# Patient Record
Sex: Female | Born: 1980
Health system: Southern US, Community
[De-identification: ages and names within clinical notes are randomized; demographics above are authoritative.]

## PROBLEM LIST (undated history)

## (undated) DIAGNOSIS — R87619 Unspecified abnormal cytological findings in specimens from cervix uteri: Secondary | ICD-10-CM

## (undated) DIAGNOSIS — IMO0002 Reserved for concepts with insufficient information to code with codable children: Secondary | ICD-10-CM

## (undated) DIAGNOSIS — G43909 Migraine, unspecified, not intractable, without status migrainosus: Secondary | ICD-10-CM

## (undated) DIAGNOSIS — K219 Gastro-esophageal reflux disease without esophagitis: Secondary | ICD-10-CM

## (undated) DIAGNOSIS — N760 Acute vaginitis: Secondary | ICD-10-CM

## (undated) DIAGNOSIS — B9689 Other specified bacterial agents as the cause of diseases classified elsewhere: Secondary | ICD-10-CM

## (undated) HISTORY — DX: Gastro-esophageal reflux disease without esophagitis: K21.9

## (undated) HISTORY — PX: APPENDECTOMY: SHX54

## (undated) HISTORY — PX: DENTAL SURGERY: SHX609

---
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
## 2001-04-20 ENCOUNTER — Emergency Department (HOSPITAL_COMMUNITY): Admission: EM | Admit: 2001-04-20 | Discharge: 2001-04-20 | Payer: Self-pay | Admitting: Emergency Medicine

## 2001-06-09 ENCOUNTER — Emergency Department (HOSPITAL_COMMUNITY): Admission: EM | Admit: 2001-06-09 | Discharge: 2001-06-09 | Payer: Self-pay | Admitting: *Deleted

## 2001-09-25 ENCOUNTER — Emergency Department (HOSPITAL_COMMUNITY): Admission: EM | Admit: 2001-09-25 | Discharge: 2001-09-25 | Payer: Self-pay | Admitting: Emergency Medicine

## 2001-09-26 ENCOUNTER — Emergency Department (HOSPITAL_COMMUNITY): Admission: EM | Admit: 2001-09-26 | Discharge: 2001-09-26 | Payer: Self-pay | Admitting: Emergency Medicine

## 2001-09-26 ENCOUNTER — Encounter: Payer: Self-pay | Admitting: *Deleted

## 2001-10-17 ENCOUNTER — Emergency Department (HOSPITAL_COMMUNITY): Admission: EM | Admit: 2001-10-17 | Discharge: 2001-10-17 | Payer: Self-pay

## 2001-11-20 ENCOUNTER — Emergency Department (HOSPITAL_COMMUNITY): Admission: EM | Admit: 2001-11-20 | Discharge: 2001-11-21 | Payer: Self-pay | Admitting: Emergency Medicine

## 2001-11-20 ENCOUNTER — Encounter: Payer: Self-pay | Admitting: Emergency Medicine

## 2001-12-10 ENCOUNTER — Encounter: Admission: RE | Admit: 2001-12-10 | Discharge: 2002-03-10 | Payer: Self-pay | Admitting: Occupational Medicine

## 2002-02-14 ENCOUNTER — Emergency Department (HOSPITAL_COMMUNITY): Admission: EM | Admit: 2002-02-14 | Discharge: 2002-02-14 | Payer: Self-pay

## 2002-03-12 ENCOUNTER — Emergency Department (HOSPITAL_COMMUNITY): Admission: EM | Admit: 2002-03-12 | Discharge: 2002-03-13 | Payer: Self-pay | Admitting: Emergency Medicine

## 2002-03-13 ENCOUNTER — Inpatient Hospital Stay (HOSPITAL_COMMUNITY): Admission: AD | Admit: 2002-03-13 | Discharge: 2002-03-13 | Payer: Self-pay | Admitting: *Deleted

## 2002-03-23 ENCOUNTER — Inpatient Hospital Stay (HOSPITAL_COMMUNITY): Admission: AD | Admit: 2002-03-23 | Discharge: 2002-03-23 | Payer: Self-pay | Admitting: *Deleted

## 2002-03-24 ENCOUNTER — Encounter: Payer: Self-pay | Admitting: Obstetrics and Gynecology

## 2002-03-24 ENCOUNTER — Inpatient Hospital Stay (HOSPITAL_COMMUNITY): Admission: AD | Admit: 2002-03-24 | Discharge: 2002-03-24 | Payer: Self-pay | Admitting: Obstetrics and Gynecology

## 2002-04-16 ENCOUNTER — Inpatient Hospital Stay: Admission: AD | Admit: 2002-04-16 | Discharge: 2002-04-16 | Payer: Self-pay | Admitting: Obstetrics and Gynecology

## 2002-05-06 ENCOUNTER — Inpatient Hospital Stay (HOSPITAL_COMMUNITY): Admission: AD | Admit: 2002-05-06 | Discharge: 2002-05-06 | Payer: Self-pay | Admitting: *Deleted

## 2002-05-14 ENCOUNTER — Inpatient Hospital Stay (HOSPITAL_COMMUNITY): Admission: AD | Admit: 2002-05-14 | Discharge: 2002-05-15 | Payer: Self-pay | Admitting: Obstetrics and Gynecology

## 2002-05-21 ENCOUNTER — Inpatient Hospital Stay (HOSPITAL_COMMUNITY): Admission: AD | Admit: 2002-05-21 | Discharge: 2002-05-23 | Payer: Self-pay | Admitting: Obstetrics and Gynecology

## 2002-06-11 ENCOUNTER — Ambulatory Visit (HOSPITAL_COMMUNITY): Admission: RE | Admit: 2002-06-11 | Discharge: 2002-06-11 | Payer: Self-pay | Admitting: *Deleted

## 2002-06-25 ENCOUNTER — Inpatient Hospital Stay (HOSPITAL_COMMUNITY): Admission: AD | Admit: 2002-06-25 | Discharge: 2002-06-25 | Payer: Self-pay | Admitting: Obstetrics and Gynecology

## 2002-07-11 ENCOUNTER — Inpatient Hospital Stay (HOSPITAL_COMMUNITY): Admission: AD | Admit: 2002-07-11 | Discharge: 2002-07-11 | Payer: Self-pay | Admitting: Family Medicine

## 2002-08-13 ENCOUNTER — Emergency Department (HOSPITAL_COMMUNITY): Admission: EM | Admit: 2002-08-13 | Discharge: 2002-08-13 | Payer: Self-pay | Admitting: Emergency Medicine

## 2002-08-24 ENCOUNTER — Inpatient Hospital Stay (HOSPITAL_COMMUNITY): Admission: AD | Admit: 2002-08-24 | Discharge: 2002-08-24 | Payer: Self-pay | Admitting: Obstetrics and Gynecology

## 2002-09-19 ENCOUNTER — Emergency Department (HOSPITAL_COMMUNITY): Admission: EM | Admit: 2002-09-19 | Discharge: 2002-09-19 | Payer: Self-pay | Admitting: Emergency Medicine

## 2002-09-29 ENCOUNTER — Inpatient Hospital Stay (HOSPITAL_COMMUNITY): Admission: AD | Admit: 2002-09-29 | Discharge: 2002-09-29 | Payer: Self-pay | Admitting: *Deleted

## 2002-10-02 ENCOUNTER — Inpatient Hospital Stay (HOSPITAL_COMMUNITY): Admission: AD | Admit: 2002-10-02 | Discharge: 2002-10-02 | Payer: Self-pay | Admitting: *Deleted

## 2002-10-24 ENCOUNTER — Inpatient Hospital Stay (HOSPITAL_COMMUNITY): Admission: AD | Admit: 2002-10-24 | Discharge: 2002-10-24 | Payer: Self-pay | Admitting: Family Medicine

## 2002-10-28 ENCOUNTER — Inpatient Hospital Stay (HOSPITAL_COMMUNITY): Admission: AD | Admit: 2002-10-28 | Discharge: 2002-10-28 | Payer: Self-pay | Admitting: *Deleted

## 2002-10-28 ENCOUNTER — Encounter: Payer: Self-pay | Admitting: *Deleted

## 2002-11-06 ENCOUNTER — Inpatient Hospital Stay: Admission: AD | Admit: 2002-11-06 | Discharge: 2002-11-06 | Payer: Self-pay | Admitting: *Deleted

## 2002-11-10 ENCOUNTER — Inpatient Hospital Stay (HOSPITAL_COMMUNITY): Admission: AD | Admit: 2002-11-10 | Discharge: 2002-11-10 | Payer: Self-pay | Admitting: Obstetrics and Gynecology

## 2002-11-16 ENCOUNTER — Inpatient Hospital Stay (HOSPITAL_COMMUNITY): Admission: AD | Admit: 2002-11-16 | Discharge: 2002-11-18 | Payer: Self-pay | Admitting: *Deleted

## 2003-02-17 ENCOUNTER — Encounter: Payer: Self-pay | Admitting: Emergency Medicine

## 2003-02-17 ENCOUNTER — Emergency Department (HOSPITAL_COMMUNITY): Admission: EM | Admit: 2003-02-17 | Discharge: 2003-02-17 | Payer: Self-pay | Admitting: Emergency Medicine

## 2003-04-12 ENCOUNTER — Emergency Department (HOSPITAL_COMMUNITY): Admission: EM | Admit: 2003-04-12 | Discharge: 2003-04-12 | Payer: Self-pay | Admitting: Emergency Medicine

## 2003-04-26 ENCOUNTER — Inpatient Hospital Stay (HOSPITAL_COMMUNITY): Admission: AD | Admit: 2003-04-26 | Discharge: 2003-04-26 | Payer: Self-pay | Admitting: Family Medicine

## 2003-06-21 ENCOUNTER — Inpatient Hospital Stay (HOSPITAL_COMMUNITY): Admission: AD | Admit: 2003-06-21 | Discharge: 2003-06-21 | Payer: Self-pay | Admitting: Obstetrics & Gynecology

## 2003-09-30 ENCOUNTER — Inpatient Hospital Stay (HOSPITAL_COMMUNITY): Admission: AD | Admit: 2003-09-30 | Discharge: 2003-09-30 | Payer: Self-pay | Admitting: Obstetrics and Gynecology

## 2003-12-10 ENCOUNTER — Inpatient Hospital Stay (HOSPITAL_COMMUNITY): Admission: AD | Admit: 2003-12-10 | Discharge: 2003-12-11 | Payer: Self-pay | Admitting: Family Medicine

## 2004-02-12 ENCOUNTER — Emergency Department (HOSPITAL_COMMUNITY): Admission: EM | Admit: 2004-02-12 | Discharge: 2004-02-12 | Payer: Self-pay | Admitting: Family Medicine

## 2004-04-20 ENCOUNTER — Emergency Department (HOSPITAL_COMMUNITY): Admission: EM | Admit: 2004-04-20 | Discharge: 2004-04-20 | Payer: Self-pay | Admitting: Family Medicine

## 2004-05-04 ENCOUNTER — Inpatient Hospital Stay (HOSPITAL_COMMUNITY): Admission: AD | Admit: 2004-05-04 | Discharge: 2004-05-04 | Payer: Self-pay | Admitting: Obstetrics

## 2004-05-11 ENCOUNTER — Emergency Department (HOSPITAL_COMMUNITY): Admission: EM | Admit: 2004-05-11 | Discharge: 2004-05-11 | Payer: Self-pay | Admitting: Family Medicine

## 2004-05-21 ENCOUNTER — Emergency Department (HOSPITAL_COMMUNITY): Admission: EM | Admit: 2004-05-21 | Discharge: 2004-05-21 | Payer: Self-pay | Admitting: Emergency Medicine

## 2004-06-01 ENCOUNTER — Emergency Department (HOSPITAL_COMMUNITY): Admission: EM | Admit: 2004-06-01 | Discharge: 2004-06-01 | Payer: Self-pay | Admitting: Emergency Medicine

## 2004-06-09 ENCOUNTER — Inpatient Hospital Stay (HOSPITAL_COMMUNITY): Admission: AD | Admit: 2004-06-09 | Discharge: 2004-06-09 | Payer: Self-pay | Admitting: Obstetrics

## 2004-06-19 ENCOUNTER — Inpatient Hospital Stay (HOSPITAL_COMMUNITY): Admission: AD | Admit: 2004-06-19 | Discharge: 2004-06-19 | Payer: Self-pay | Admitting: Obstetrics

## 2004-06-20 ENCOUNTER — Inpatient Hospital Stay (HOSPITAL_COMMUNITY): Admission: AD | Admit: 2004-06-20 | Discharge: 2004-06-20 | Payer: Self-pay | Admitting: Obstetrics

## 2004-07-04 ENCOUNTER — Inpatient Hospital Stay (HOSPITAL_COMMUNITY): Admission: AD | Admit: 2004-07-04 | Discharge: 2004-07-04 | Payer: Self-pay | Admitting: Obstetrics

## 2004-07-05 ENCOUNTER — Inpatient Hospital Stay (HOSPITAL_COMMUNITY): Admission: AD | Admit: 2004-07-05 | Discharge: 2004-07-05 | Payer: Self-pay | Admitting: Obstetrics

## 2004-07-26 ENCOUNTER — Inpatient Hospital Stay (HOSPITAL_COMMUNITY): Admission: AD | Admit: 2004-07-26 | Discharge: 2004-07-26 | Payer: Self-pay | Admitting: Obstetrics

## 2004-07-30 ENCOUNTER — Inpatient Hospital Stay (HOSPITAL_COMMUNITY): Admission: AD | Admit: 2004-07-30 | Discharge: 2004-07-30 | Payer: Self-pay | Admitting: Obstetrics

## 2004-09-01 ENCOUNTER — Inpatient Hospital Stay (HOSPITAL_COMMUNITY): Admission: AD | Admit: 2004-09-01 | Discharge: 2004-09-01 | Payer: Self-pay | Admitting: *Deleted

## 2004-12-02 ENCOUNTER — Inpatient Hospital Stay (HOSPITAL_COMMUNITY): Admission: AD | Admit: 2004-12-02 | Discharge: 2004-12-02 | Payer: Self-pay | Admitting: Obstetrics

## 2004-12-31 ENCOUNTER — Inpatient Hospital Stay (HOSPITAL_COMMUNITY): Admission: AD | Admit: 2004-12-31 | Discharge: 2005-01-02 | Payer: Self-pay | Admitting: Obstetrics

## 2005-02-01 ENCOUNTER — Observation Stay (HOSPITAL_COMMUNITY): Admission: AD | Admit: 2005-02-01 | Discharge: 2005-02-01 | Payer: Self-pay | Admitting: Obstetrics

## 2005-02-03 ENCOUNTER — Encounter (INDEPENDENT_AMBULATORY_CARE_PROVIDER_SITE_OTHER): Payer: Self-pay | Admitting: Specialist

## 2005-02-03 ENCOUNTER — Inpatient Hospital Stay (HOSPITAL_COMMUNITY): Admission: AD | Admit: 2005-02-03 | Discharge: 2005-02-06 | Payer: Self-pay | Admitting: Obstetrics

## 2005-03-14 ENCOUNTER — Emergency Department (HOSPITAL_COMMUNITY): Admission: EM | Admit: 2005-03-14 | Discharge: 2005-03-14 | Payer: Self-pay | Admitting: Family Medicine

## 2005-05-15 ENCOUNTER — Emergency Department (HOSPITAL_COMMUNITY): Admission: EM | Admit: 2005-05-15 | Discharge: 2005-05-15 | Payer: Self-pay | Admitting: Psychiatry

## 2005-09-18 ENCOUNTER — Emergency Department (HOSPITAL_COMMUNITY): Admission: EM | Admit: 2005-09-18 | Discharge: 2005-09-18 | Payer: Self-pay | Admitting: Family Medicine

## 2005-11-29 ENCOUNTER — Ambulatory Visit (HOSPITAL_COMMUNITY): Admission: RE | Admit: 2005-11-29 | Discharge: 2005-11-29 | Payer: Self-pay | Admitting: Obstetrics

## 2006-03-11 ENCOUNTER — Emergency Department (HOSPITAL_COMMUNITY): Admission: EM | Admit: 2006-03-11 | Discharge: 2006-03-11 | Payer: Self-pay | Admitting: Family Medicine

## 2006-07-08 ENCOUNTER — Inpatient Hospital Stay (HOSPITAL_COMMUNITY): Admission: AD | Admit: 2006-07-08 | Discharge: 2006-07-08 | Payer: Self-pay | Admitting: Obstetrics

## 2006-07-21 ENCOUNTER — Emergency Department (HOSPITAL_COMMUNITY): Admission: EM | Admit: 2006-07-21 | Discharge: 2006-07-21 | Payer: Self-pay | Admitting: Family Medicine

## 2006-10-03 ENCOUNTER — Emergency Department (HOSPITAL_COMMUNITY): Admission: EM | Admit: 2006-10-03 | Discharge: 2006-10-03 | Payer: Self-pay | Admitting: Emergency Medicine

## 2006-10-21 ENCOUNTER — Inpatient Hospital Stay (HOSPITAL_COMMUNITY): Admission: AD | Admit: 2006-10-21 | Discharge: 2006-10-21 | Payer: Self-pay | Admitting: Obstetrics

## 2006-12-19 ENCOUNTER — Emergency Department (HOSPITAL_COMMUNITY): Admission: EM | Admit: 2006-12-19 | Discharge: 2006-12-19 | Payer: Self-pay | Admitting: Family Medicine

## 2007-01-20 ENCOUNTER — Emergency Department (HOSPITAL_COMMUNITY): Admission: EM | Admit: 2007-01-20 | Discharge: 2007-01-20 | Payer: Self-pay | Admitting: Emergency Medicine

## 2007-01-21 ENCOUNTER — Emergency Department (HOSPITAL_COMMUNITY): Admission: EM | Admit: 2007-01-21 | Discharge: 2007-01-21 | Payer: Self-pay | Admitting: Emergency Medicine

## 2007-02-20 ENCOUNTER — Emergency Department (HOSPITAL_COMMUNITY): Admission: EM | Admit: 2007-02-20 | Discharge: 2007-02-21 | Payer: Self-pay | Admitting: Emergency Medicine

## 2007-04-25 ENCOUNTER — Emergency Department (HOSPITAL_COMMUNITY): Admission: EM | Admit: 2007-04-25 | Discharge: 2007-04-25 | Payer: Self-pay | Admitting: Emergency Medicine

## 2007-08-20 ENCOUNTER — Inpatient Hospital Stay (HOSPITAL_COMMUNITY): Admission: AD | Admit: 2007-08-20 | Discharge: 2007-08-20 | Payer: Self-pay | Admitting: Obstetrics & Gynecology

## 2007-09-29 ENCOUNTER — Emergency Department (HOSPITAL_COMMUNITY): Admission: EM | Admit: 2007-09-29 | Discharge: 2007-09-29 | Payer: Self-pay | Admitting: Emergency Medicine

## 2007-10-16 ENCOUNTER — Emergency Department (HOSPITAL_COMMUNITY): Admission: EM | Admit: 2007-10-16 | Discharge: 2007-10-16 | Payer: Self-pay | Admitting: Emergency Medicine

## 2007-10-19 ENCOUNTER — Emergency Department (HOSPITAL_COMMUNITY): Admission: EM | Admit: 2007-10-19 | Discharge: 2007-10-19 | Payer: Self-pay | Admitting: Emergency Medicine

## 2007-10-26 ENCOUNTER — Inpatient Hospital Stay (HOSPITAL_COMMUNITY): Admission: AD | Admit: 2007-10-26 | Discharge: 2007-10-26 | Payer: Self-pay | Admitting: Obstetrics & Gynecology

## 2007-10-26 ENCOUNTER — Emergency Department (HOSPITAL_COMMUNITY): Admission: EM | Admit: 2007-10-26 | Discharge: 2007-10-26 | Payer: Self-pay | Admitting: Family Medicine

## 2007-10-29 ENCOUNTER — Inpatient Hospital Stay (HOSPITAL_COMMUNITY): Admission: AD | Admit: 2007-10-29 | Discharge: 2007-10-29 | Payer: Self-pay | Admitting: Gynecology

## 2007-11-06 ENCOUNTER — Inpatient Hospital Stay (HOSPITAL_COMMUNITY): Admission: AD | Admit: 2007-11-06 | Discharge: 2007-11-06 | Payer: Self-pay | Admitting: Obstetrics

## 2007-11-14 ENCOUNTER — Emergency Department (HOSPITAL_COMMUNITY): Admission: EM | Admit: 2007-11-14 | Discharge: 2007-11-14 | Payer: Self-pay | Admitting: Family Medicine

## 2007-12-03 ENCOUNTER — Encounter: Admission: RE | Admit: 2007-12-03 | Discharge: 2008-01-16 | Payer: Self-pay | Admitting: Orthopedic Surgery

## 2007-12-11 ENCOUNTER — Inpatient Hospital Stay (HOSPITAL_COMMUNITY): Admission: AD | Admit: 2007-12-11 | Discharge: 2007-12-11 | Payer: Self-pay | Admitting: Obstetrics

## 2007-12-12 ENCOUNTER — Ambulatory Visit (HOSPITAL_COMMUNITY): Admission: RE | Admit: 2007-12-12 | Discharge: 2007-12-12 | Payer: Self-pay | Admitting: Obstetrics

## 2007-12-12 ENCOUNTER — Encounter (INDEPENDENT_AMBULATORY_CARE_PROVIDER_SITE_OTHER): Payer: Self-pay | Admitting: Obstetrics

## 2008-02-02 IMAGING — CR DG WRIST COMPLETE 3+V*R*
3 series · 3 of 3 positions shown · non-contrast
Comparison: Report from right wrist 0779.  The images have been purged from the system and are not available for review.

CLINICAL DATA: Trauma and fall.  
 RIGHT WRIST - 3 VIEW:

[x wrist pa right]
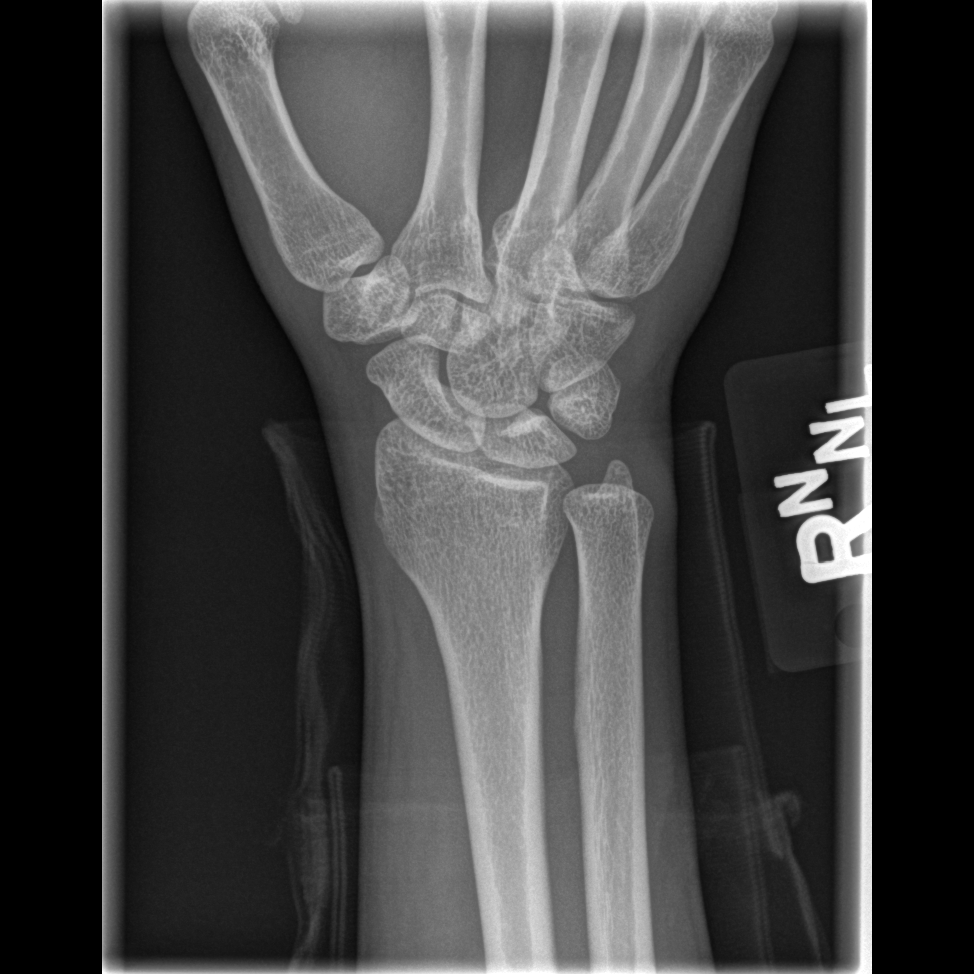

[x wrist obl right]
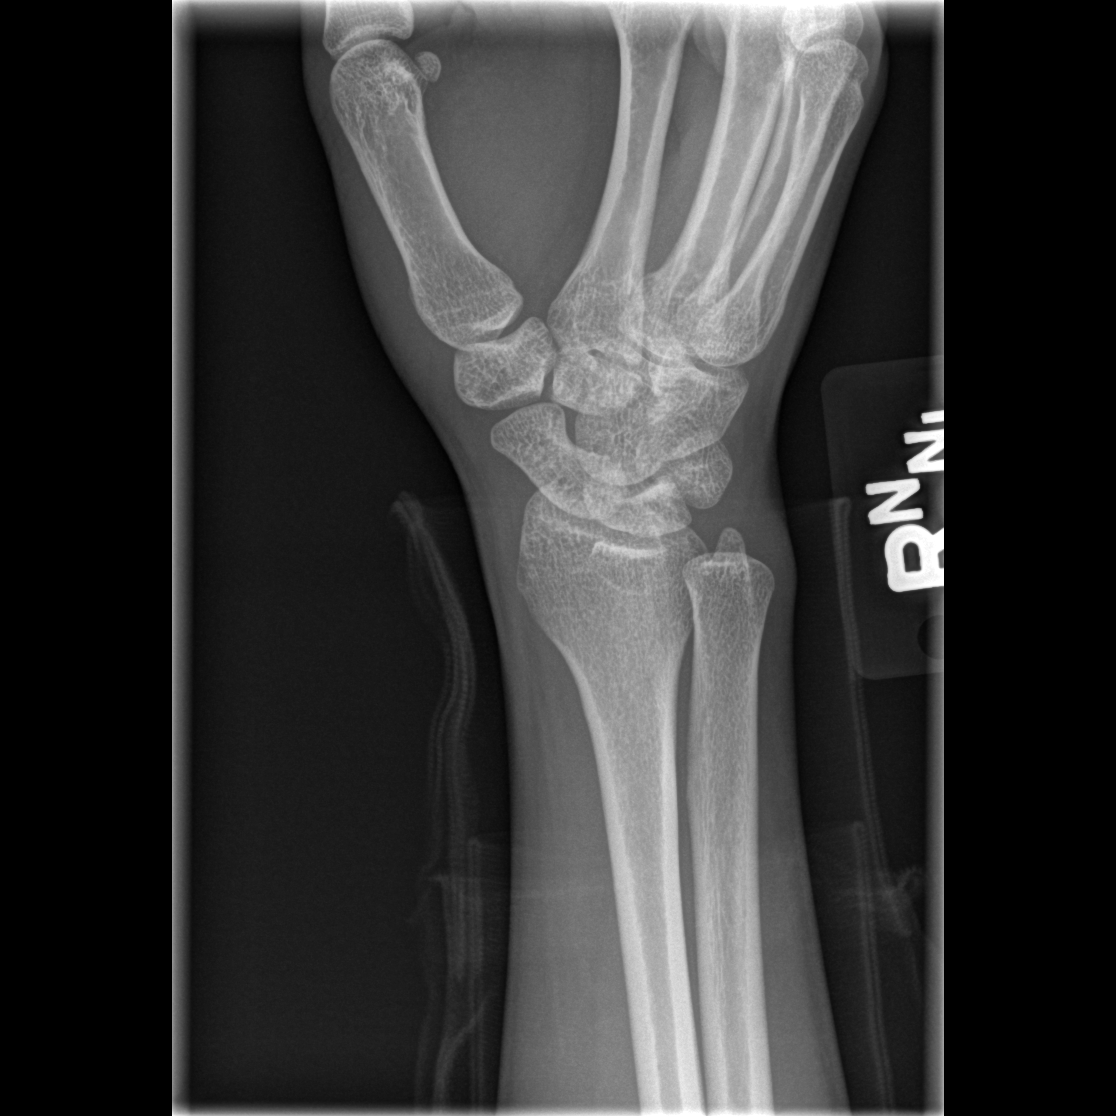

[x wrist lat right]
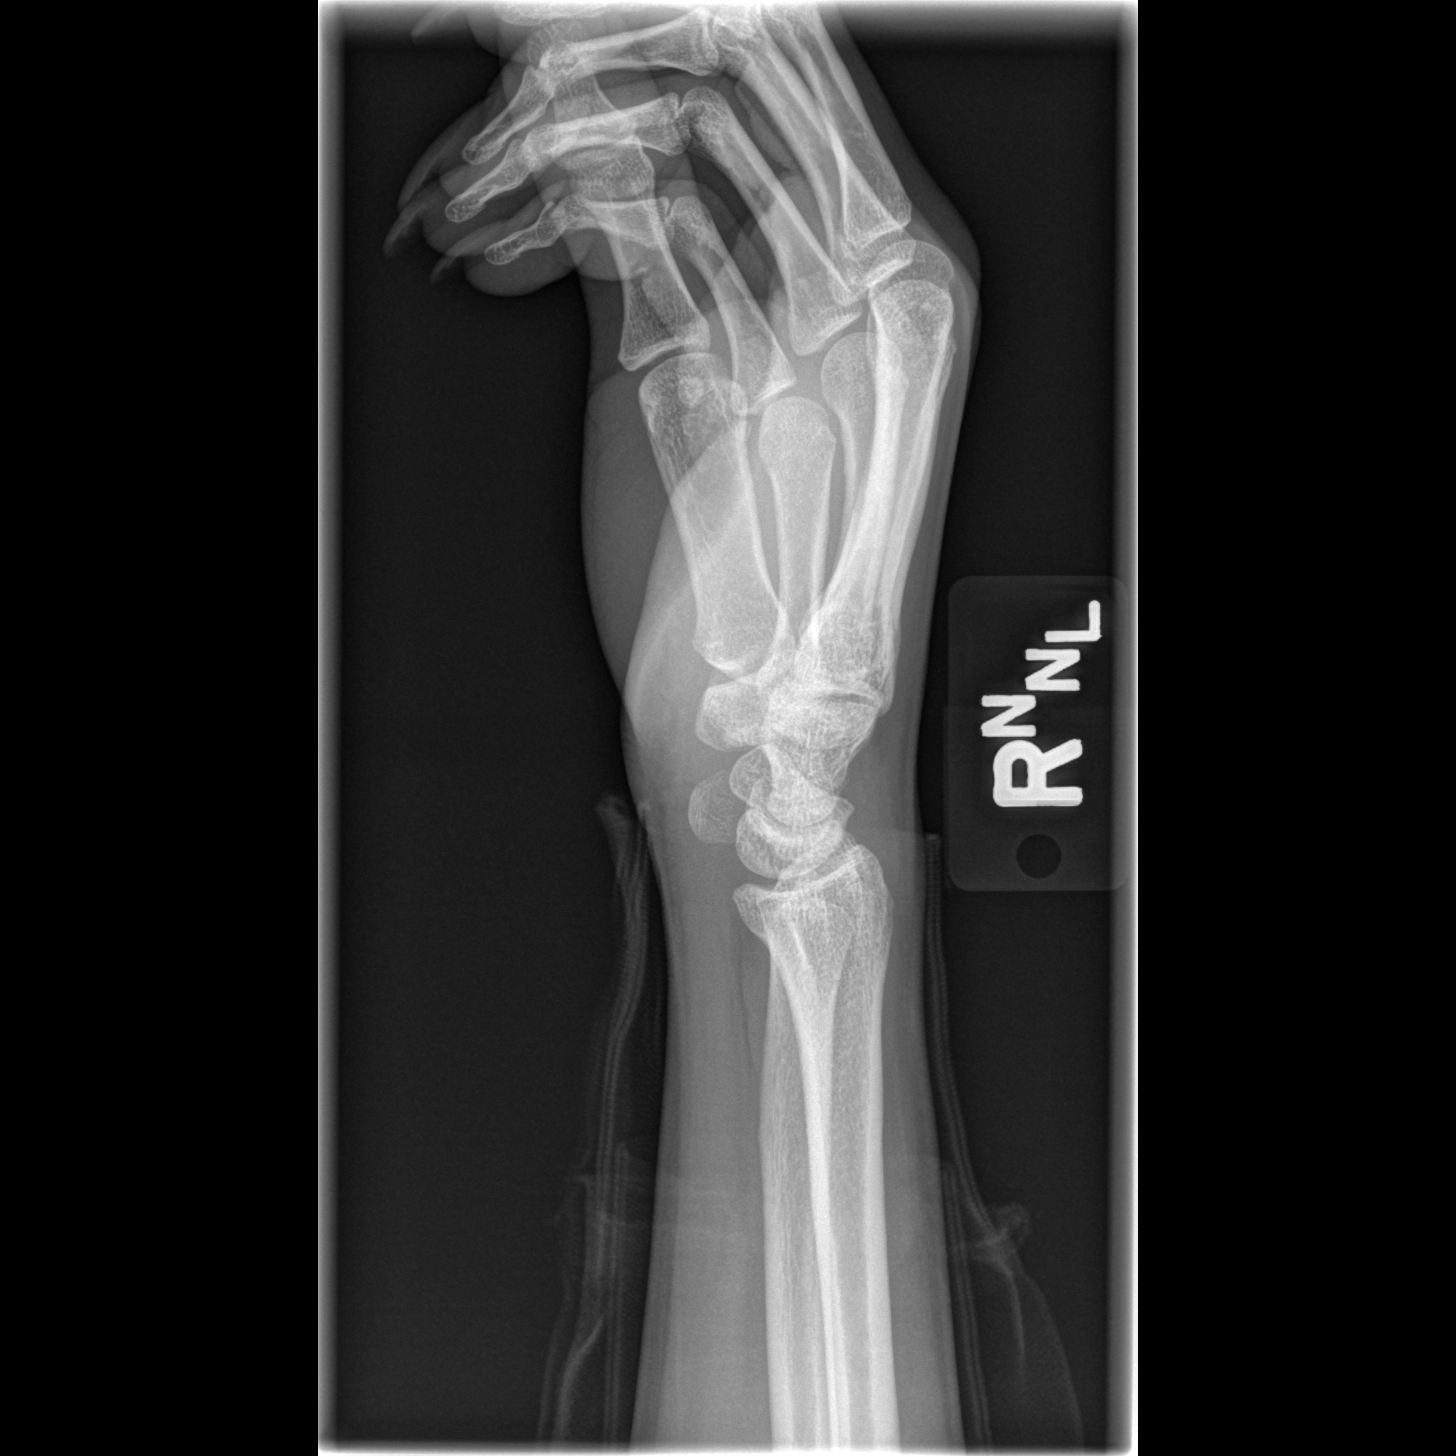

[3 of 3 positions shown; findings below may reference images not displayed]

FINDINGS: The wrist is aligned.  No focal soft tissue swelling is appreciated. 
 On the AP view of the wrist, there is a subtle lucency in the mid aspect of the distal radius extending into the radiocarpal joint that may represent a vascular groove or a subtle nondisplaced fracture.  This finding is not definitely confirmed on the oblique or lateral views.  There is some sclerosis on the superior aspect of the lunate bone, not thought to be an acute finding.
IMPRESSION: Subtle lucency of the distal radius as described above.  This finding is equivocal and may just represent a vascular groove versus a nondisplaced fracture.  Findings were discussed with [place], the physician assistant taking care of the patient.  Consider conservative management with follow-up wrist radiographs.

## 2008-02-05 ENCOUNTER — Emergency Department (HOSPITAL_COMMUNITY): Admission: EM | Admit: 2008-02-05 | Discharge: 2008-02-05 | Payer: Self-pay | Admitting: Emergency Medicine

## 2008-02-23 ENCOUNTER — Emergency Department (HOSPITAL_COMMUNITY): Admission: EM | Admit: 2008-02-23 | Discharge: 2008-02-23 | Payer: Self-pay | Admitting: Family Medicine

## 2008-03-11 IMAGING — US US OB TRANSVAGINAL
1 series · 14 of 28 positions shown · non-contrast
Comparison: none

OBSTETRICAL ULTRASOUND:

 This ultrasound exam was performed in the [HOSPITAL] Ultrasound Department.  The OB US report was generated in the AS system, and faxed to the ordering physician.  This report is also available in [REDACTED] PACS.

[Series 1: us ob transvaginal · 0.06mm/px · 14 of 31 slices shown]
[im 2/31]
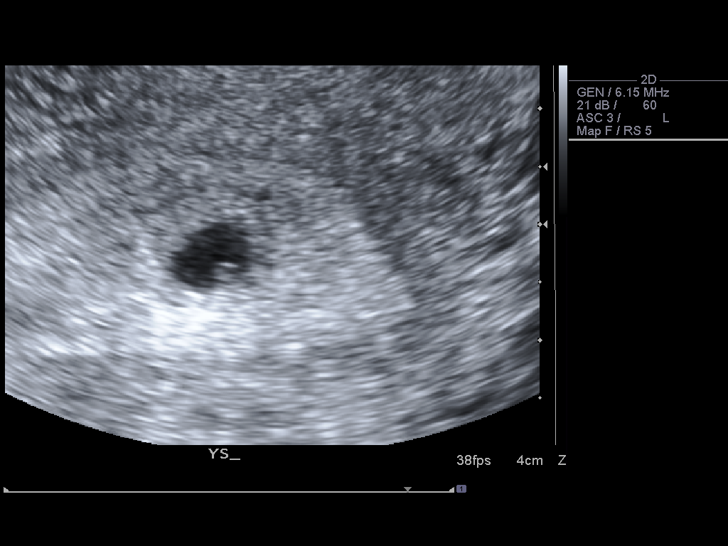
[im 4/31]
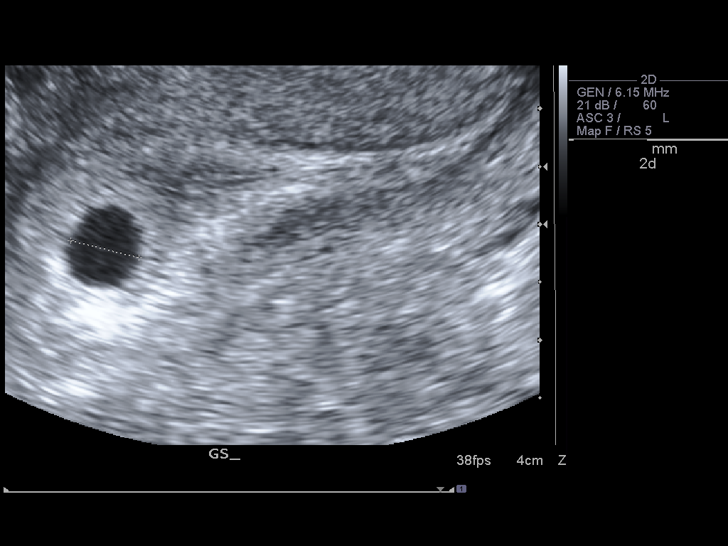
[im 6/31]
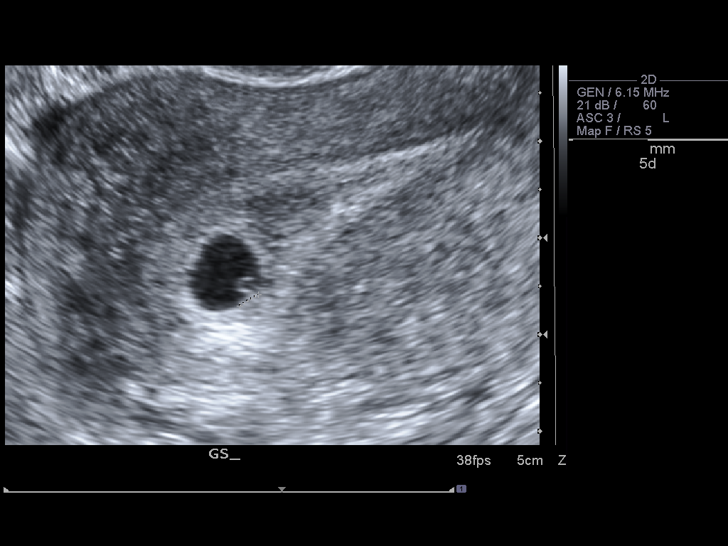
[im 8/31]
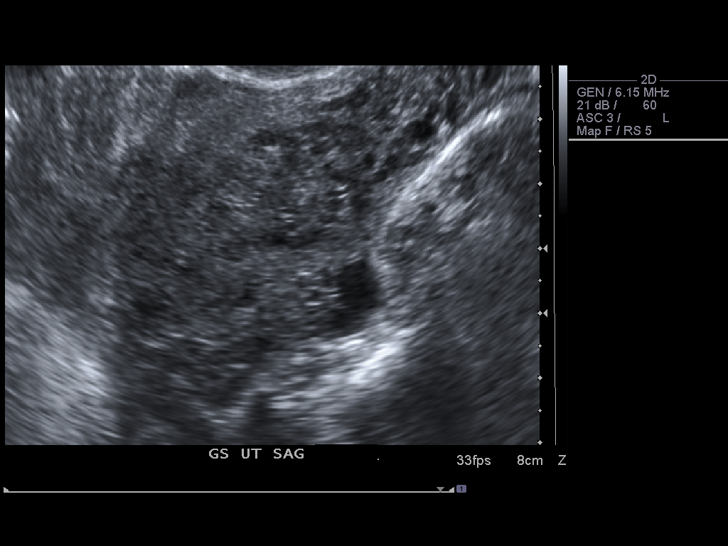
[im 11/31]
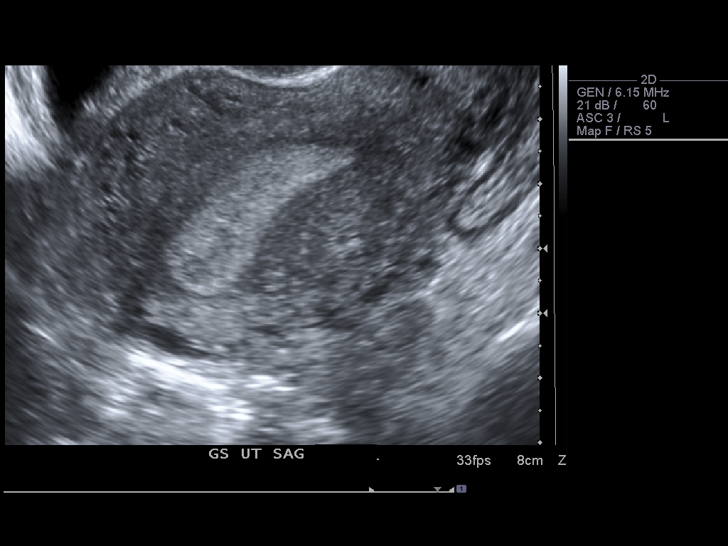
[im 13/31]
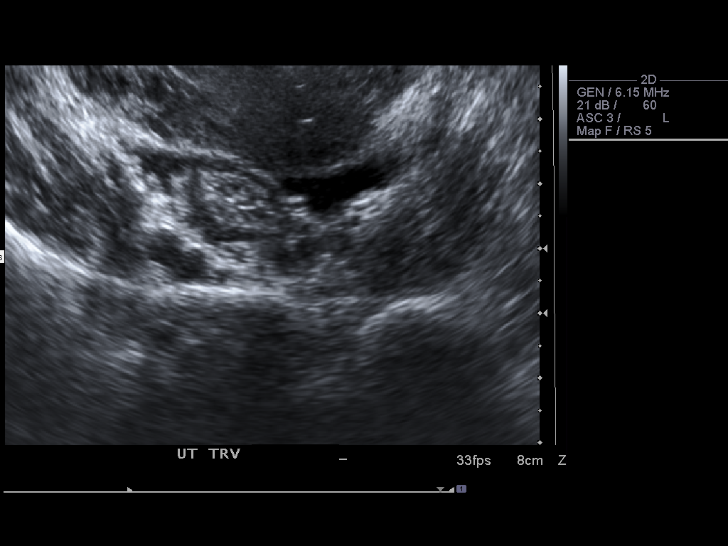
[im 15/31]
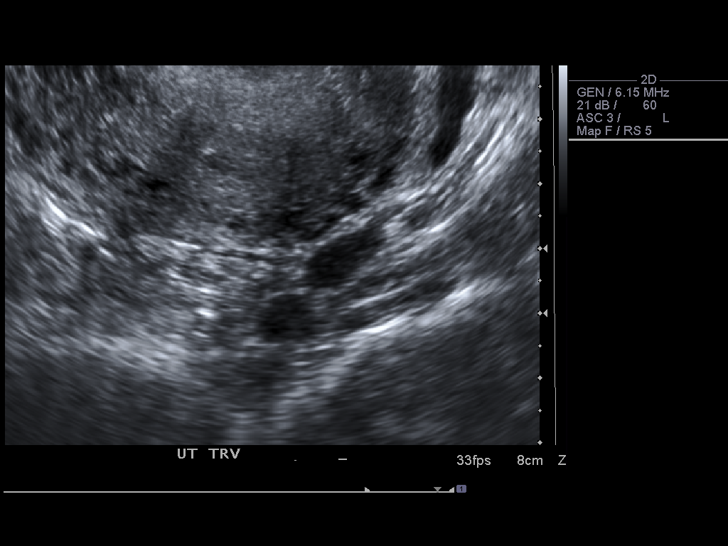
[im 17/31]
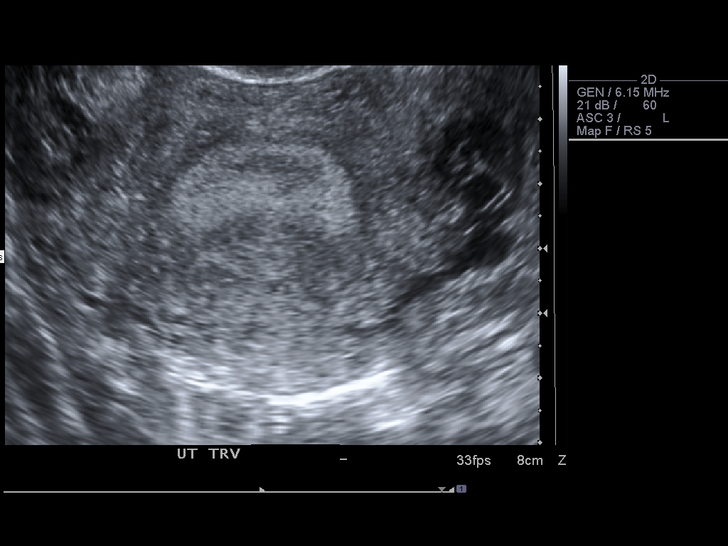
[im 19/31]
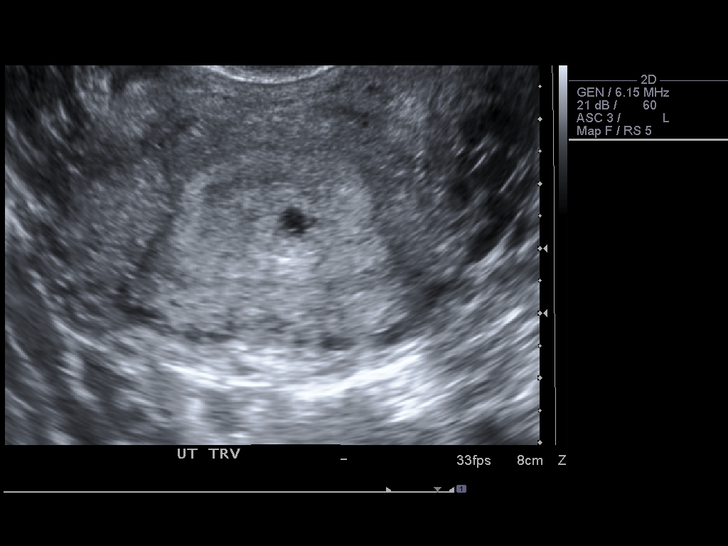
[im 22/31]
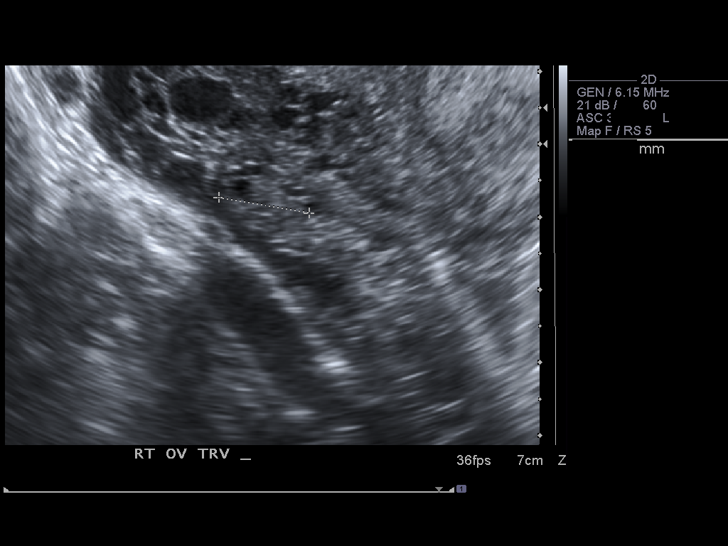
[im 24/31]
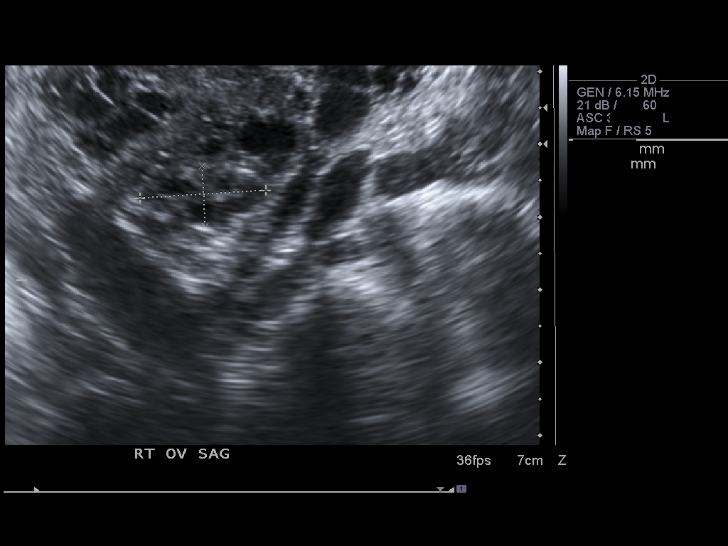
[im 26/31]
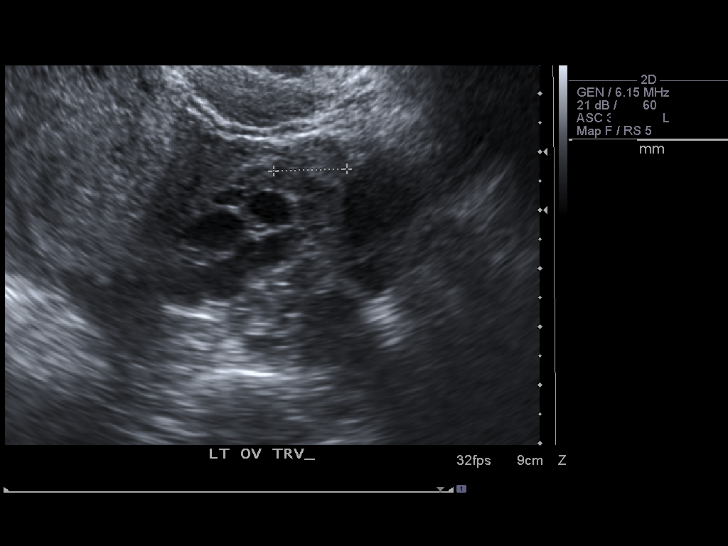
[im 28/31]
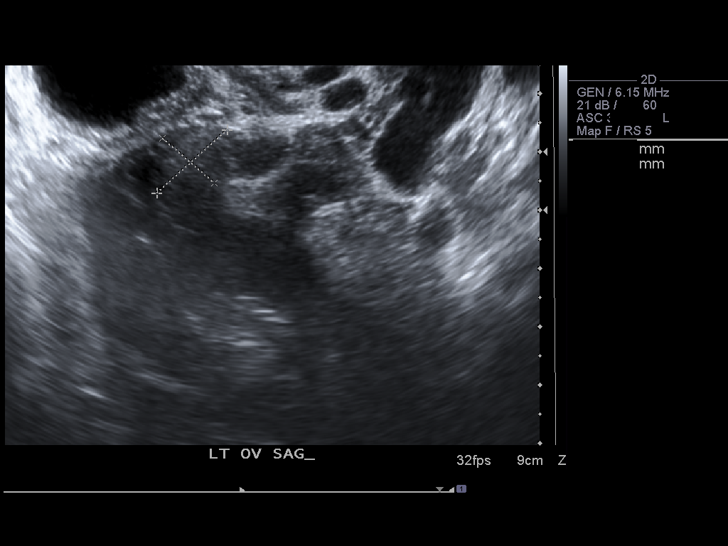
[im 31/31]
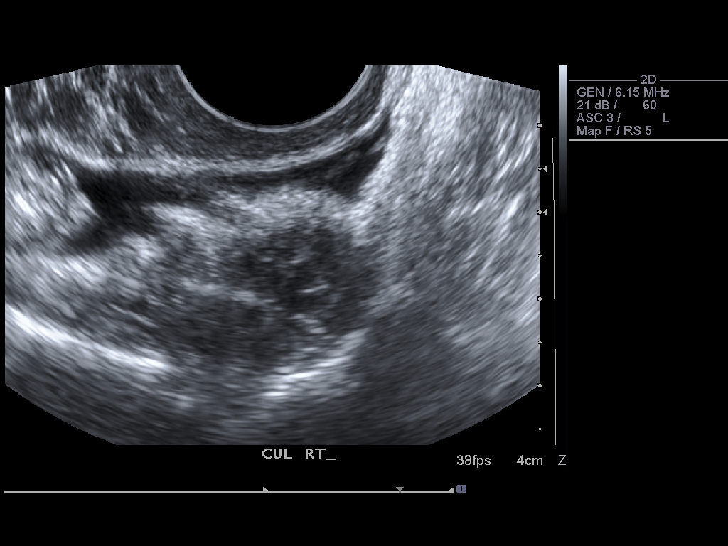

[14 of 28 positions shown; findings below may reference images not displayed]

IMPRESSION: See AS Obstetric US report.

## 2008-03-24 ENCOUNTER — Emergency Department (HOSPITAL_COMMUNITY): Admission: EM | Admit: 2008-03-24 | Discharge: 2008-03-24 | Payer: Self-pay | Admitting: Family Medicine

## 2008-03-24 ENCOUNTER — Encounter: Admission: RE | Admit: 2008-03-24 | Discharge: 2008-06-08 | Payer: Self-pay | Admitting: Orthopaedic Surgery

## 2008-04-16 IMAGING — US US OB TRANSVAGINAL
1 series · 14 of 28 positions shown · non-contrast
Comparison: none

OBSTETRICAL ULTRASOUND:
 This ultrasound exam was performed in the [HOSPITAL] Ultrasound Department.  The OB US report was generated in the AS system, and faxed to the ordering physician.  This report is also available in [REDACTED] PACS.

[Series 1: us ob comp less 14 wks · 14 of 57 slices shown]
[im 3/57]
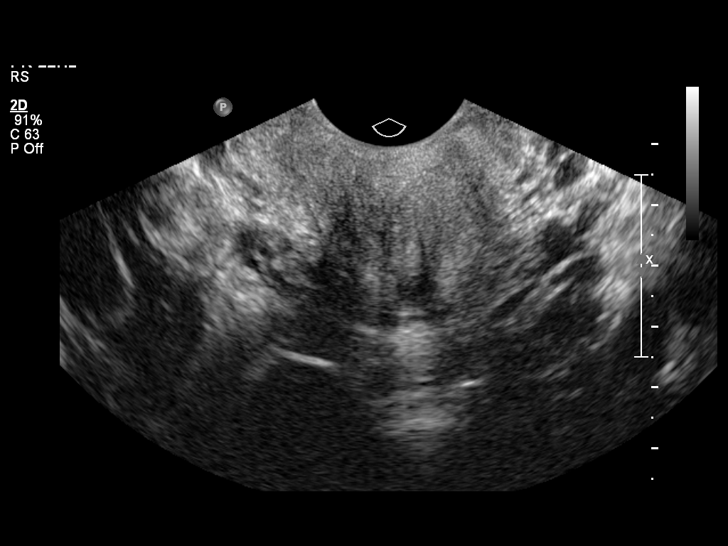
[im 7/57]
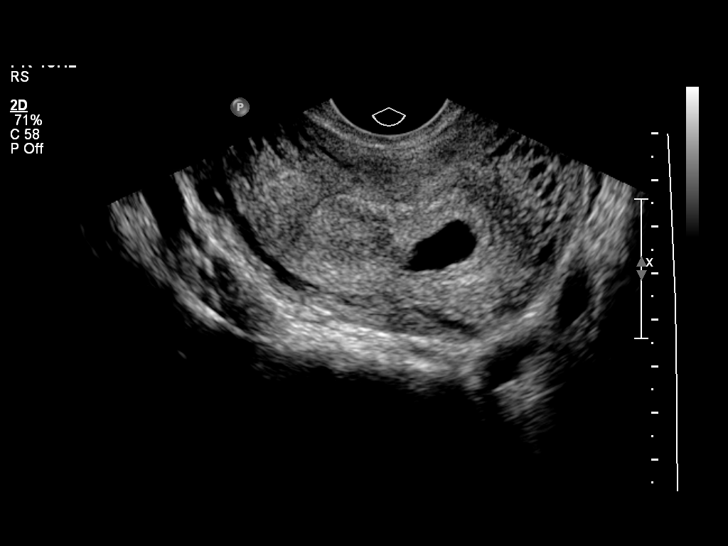
[im 11/57]
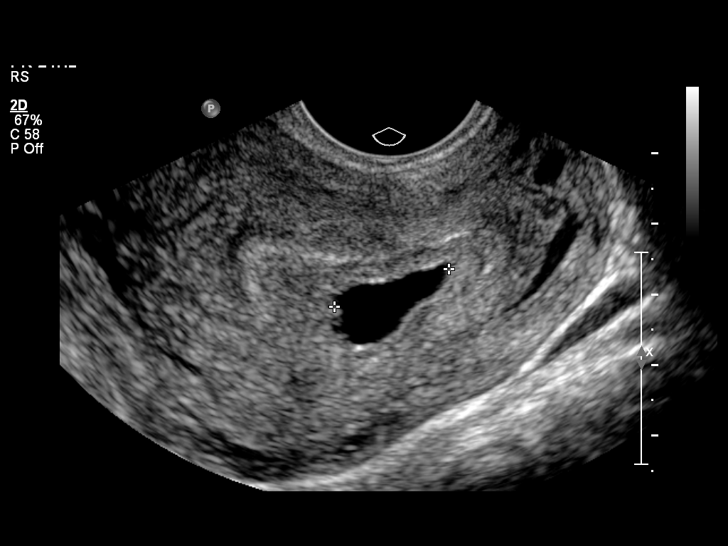
[im 15/57]
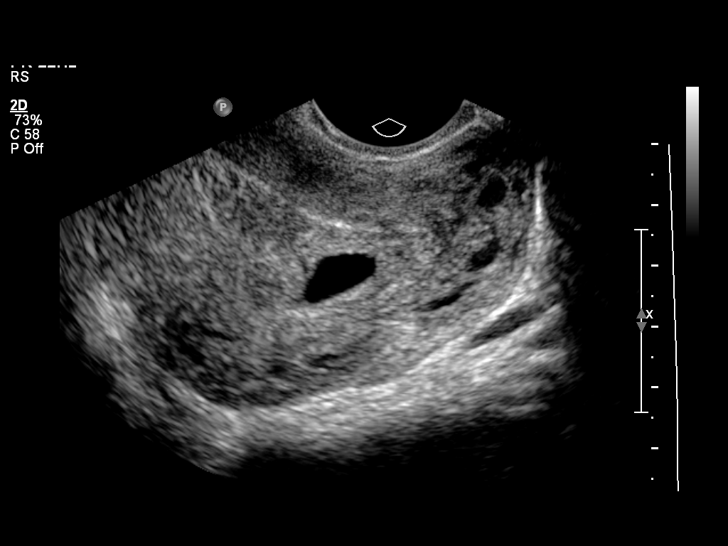
[im 19/57]
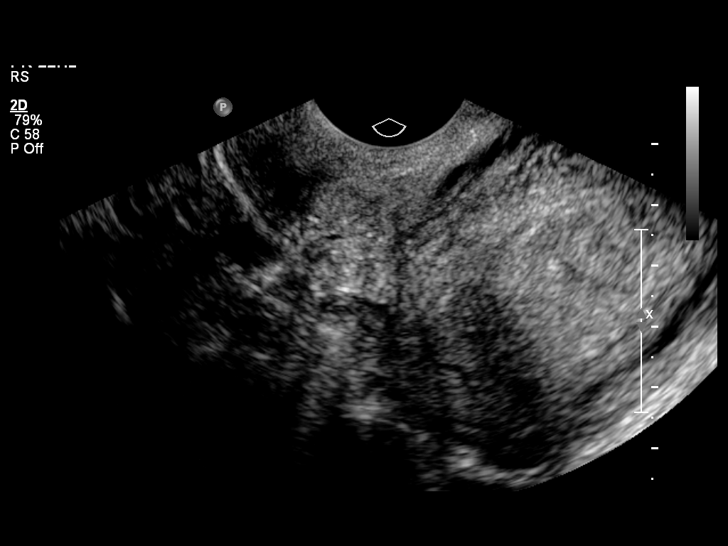
[im 23/57]
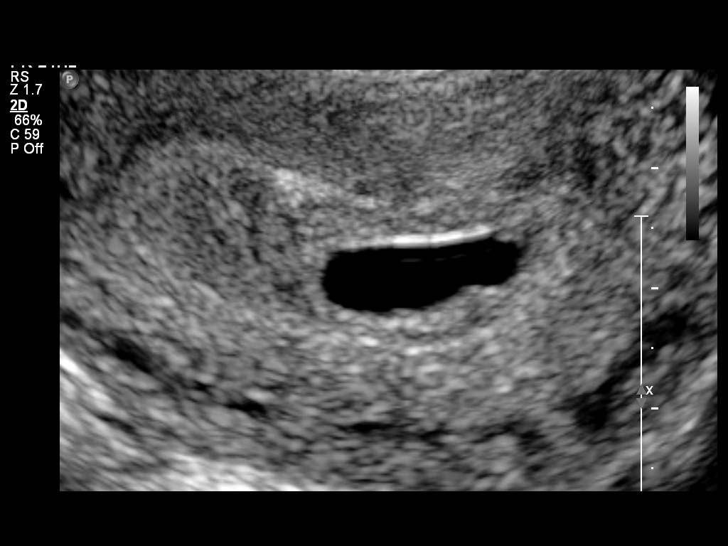
[im 27/57]
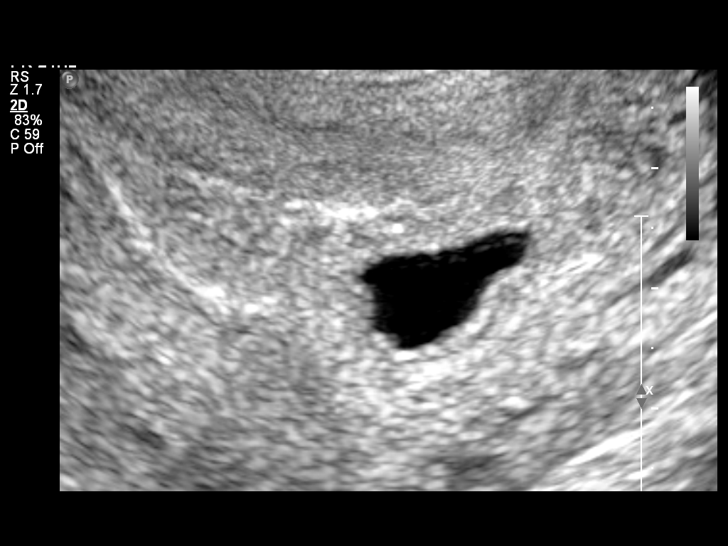
[im 32/57]
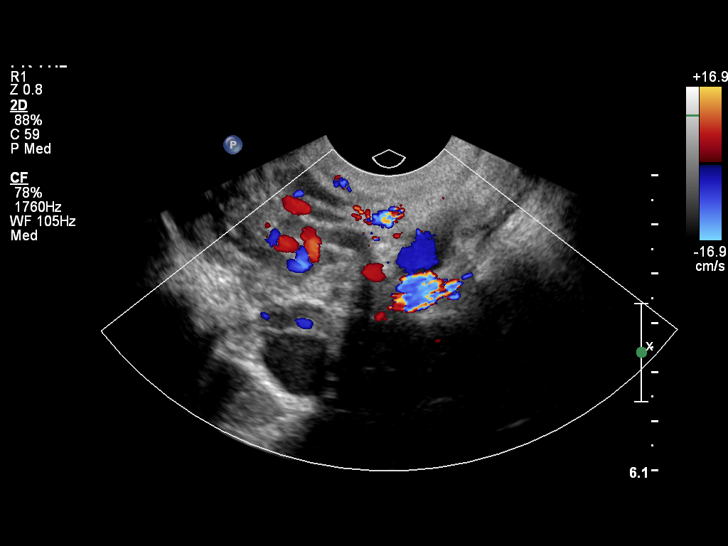
[im 36/57]
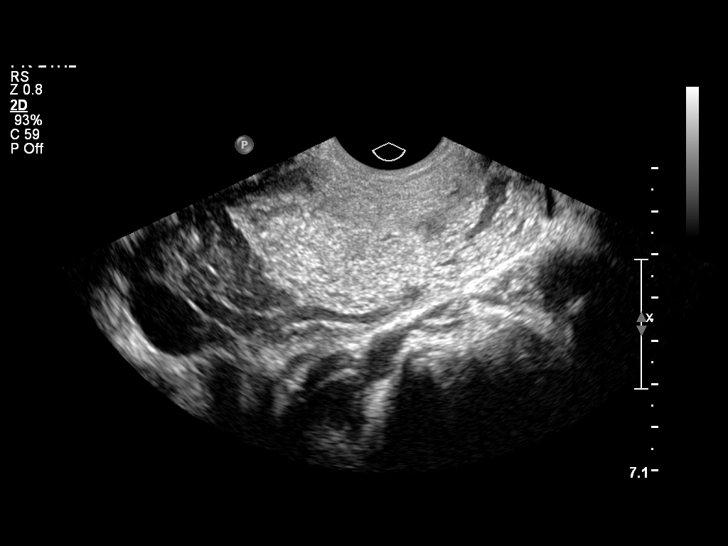
[im 40/57]
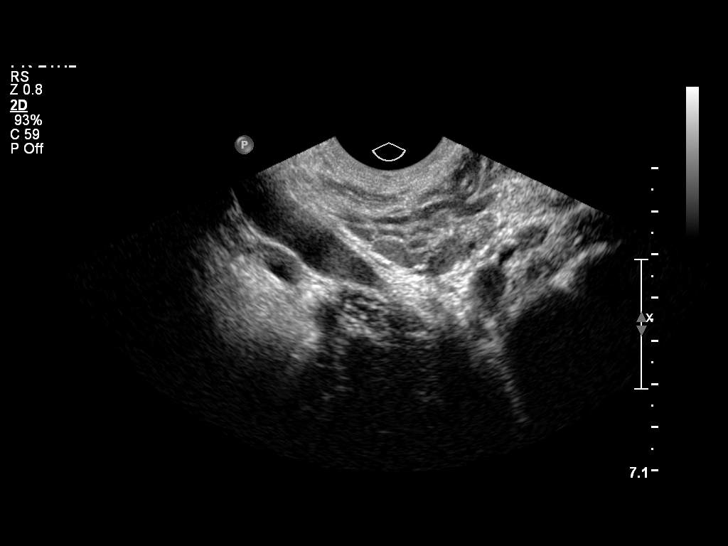
[im 44/57]
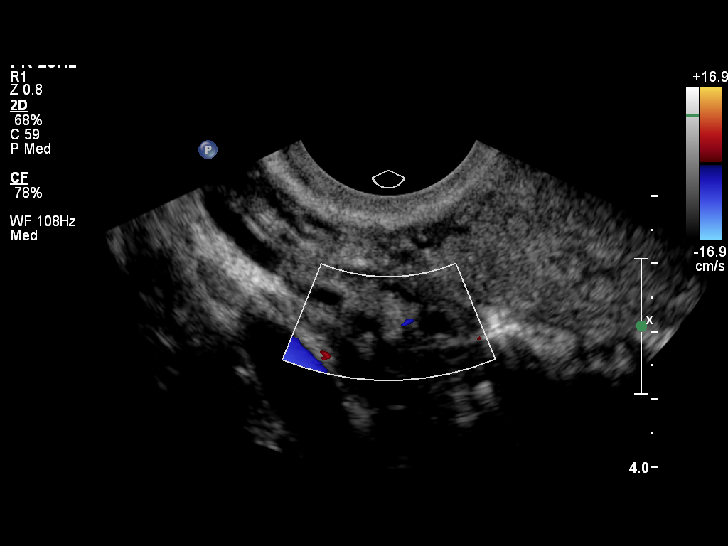
[im 48/57]
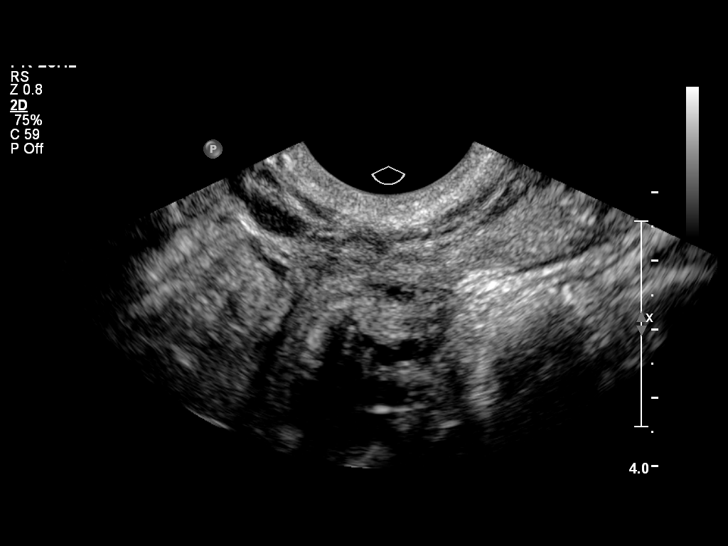
[im 52/57]
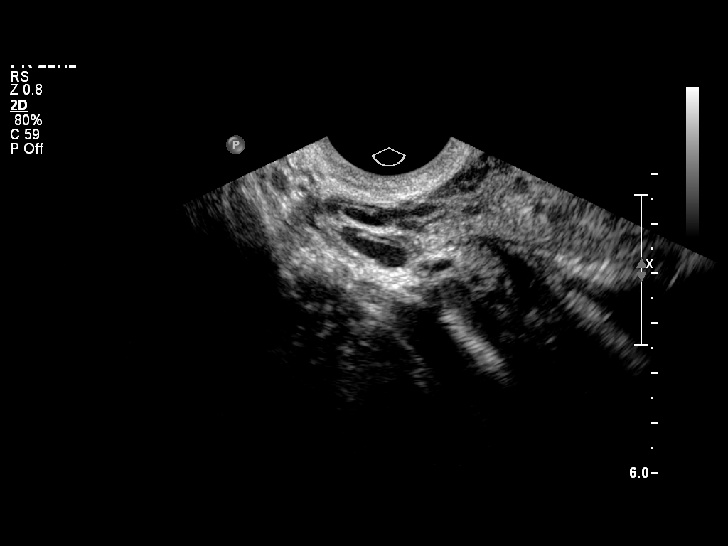
[im 57/57]
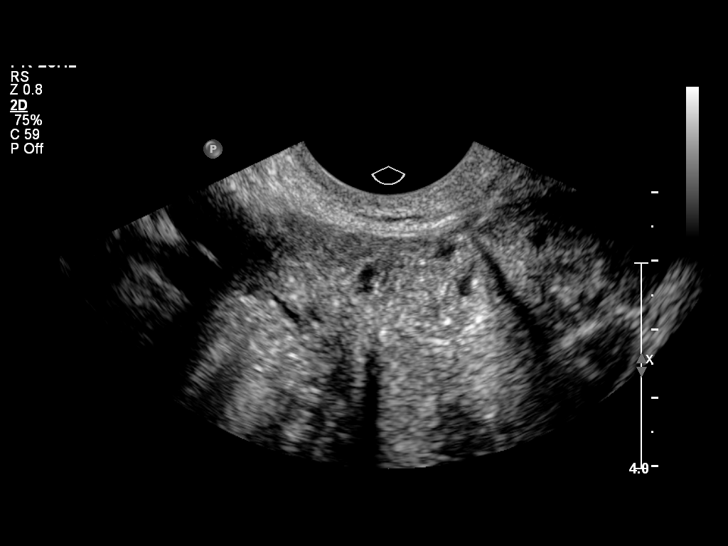

[14 of 28 positions shown; findings below may reference images not displayed]

IMPRESSION: See AS Obstetric US report.

## 2008-09-09 ENCOUNTER — Emergency Department (HOSPITAL_COMMUNITY): Admission: EM | Admit: 2008-09-09 | Discharge: 2008-09-09 | Payer: Self-pay | Admitting: Emergency Medicine

## 2008-09-23 ENCOUNTER — Emergency Department (HOSPITAL_COMMUNITY): Admission: EM | Admit: 2008-09-23 | Discharge: 2008-09-23 | Payer: Self-pay | Admitting: Emergency Medicine

## 2009-03-07 ENCOUNTER — Emergency Department (HOSPITAL_COMMUNITY): Admission: EM | Admit: 2009-03-07 | Discharge: 2009-03-07 | Payer: Self-pay | Admitting: Emergency Medicine

## 2010-05-19 ENCOUNTER — Emergency Department (HOSPITAL_COMMUNITY): Admission: EM | Admit: 2010-05-19 | Discharge: 2010-05-19 | Payer: Self-pay | Admitting: Family Medicine

## 2010-12-01 LAB — POCT I-STAT, CHEM 8
Calcium, Ion: 1.21 mmol/L (ref 1.12–1.32)
Chloride: 104 mEq/L (ref 96–112)
Creatinine, Ser: 0.7 mg/dL (ref 0.4–1.2)
Potassium: 3.4 mEq/L — ABNORMAL LOW (ref 3.5–5.1)

## 2010-12-26 LAB — WET PREP, GENITAL: Trich, Wet Prep: NONE SEEN

## 2010-12-26 LAB — POCT URINALYSIS DIP (DEVICE)
Bilirubin Urine: NEGATIVE
Glucose, UA: NEGATIVE mg/dL
Nitrite: NEGATIVE
Urobilinogen, UA: 0.2 mg/dL (ref 0.0–1.0)

## 2010-12-26 LAB — GC/CHLAMYDIA PROBE AMP, GENITAL: GC Probe Amp, Genital: NEGATIVE

## 2011-01-31 NOTE — Op Note (Signed)
Christina [name], Christina [name]              ACCOUNT NO.:  [id number]   MEDICAL RECORD NO.:  [id number]          PATIENT TYPE:  AMB   LOCATION:  [facility]                          FACILITY:  [facility]   PHYSICIAN:  Kathreen Cosier, M.D.DATE OF BIRTH:  11-03-1980   DATE OF PROCEDURE:  12/12/2007  DATE OF DISCHARGE:                               OPERATIVE REPORT   PREOPERATIVE DIAGNOSIS:  Intrauterine fetal demise at 11 weeks.   POSTOPERATIVE DIAGNOSIS:  Intrauterine fetal demise at 11 weeks.   ANESTHESIA:  MAC.   SURGEON:  Kathreen Cosier, M.D.   PROCEDURE:  The patient placed in lithotomy position, perineum and  vagina prepped and draped, bladder emptied with straight catheter.  Bimanual exam revealed uterus to be 8-week size.  Speculum placed in the  vagina.  Cervix injected with 8 mL of 1% Xylocaine.  The cavity was  sounded 10 cm and posterior.  Cervix easily admitted a #29 Pratt and a  #9 suction was used to aspirate the uterine contents until the cavity  was clean.  The patient tolerated the procedure well and was taken to  the recovery room in good condition.           ______________________________  Kathreen Cosier, M.D.     BAM/MEDQ  D:  12/12/2007  T:  12/13/2007  Job:  [id number]

## 2011-02-03 NOTE — H&P (Signed)
Christina Christina [name], Christina [name]              ACCOUNT NO.:  [id number]   MEDICAL RECORD NO.:  [id number]          PATIENT TYPE:  INP   LOCATION:  9167                          FACILITY:  [facility]   PHYSICIAN:  Kathreen Cosier, M.D.DATE OF BIRTH:  [birth year]   DATE OF ADMISSION:  02/03/2005  DATE OF DISCHARGE:                                HISTORY & PHYSICAL   HISTORY OF PRESENT ILLNESS:  The patient is a 30 year old gravida 3 para 1-0-  1-1 with EDC Feb 08, 2005. The patient presented for induction because of  security and social problems. Cervix was 1 cm, 70%, vertex, -2 to -3  station. The membranes were ruptured artificially, fluid was clear. The  patient was started on Pitocin. The patient progressed and by 1:30 p.m. she  was 4 cm dilated, vertex, -1, and she was having variable decelerations. An  IUPC was inserted and amnioinfusion with Ringer's lactate began. By 3:20  p.m. she was having persistent variable decelerations with each contraction  and there was no improvement to her position change. She was 7 cm, vertex, -  1 station, and it was decided she would be delivered by C-section for  nonreassuring fetal heart rate tracing.   PHYSICAL EXAMINATION:  GENERAL:  Revealed a well-developed female in labor.  HEENT:  Negative.  LUNGS:  Clear.  HEART:  Regular rate and rhythm. No murmurs or gallops.  BREASTS:  No masses.  ABDOMEN:  Term, estimated fetal weight was 6 pounds 12 ounces.  EXTREMITIES:  Negative.      BAM/MEDQ  D:  02/03/2005  T:  02/03/2005  Job:  [id number]

## 2011-02-03 NOTE — Discharge Summary (Signed)
NAMELOTTA, FRANKENFIELD              ACCOUNT NO.:  [id number]   MEDICAL RECORD NO.:  [id number]          PATIENT TYPE:  INP   LOCATION:  9155                          [facility]ACILITY:  [facility]   PHYSICIAN:  Kathreen Cosier, M.D.DATE OF BIRTH:  [birth year]   DATE OF ADMISSION:  12/31/2004  DATE OF DISCHARGE:  01/02/2005                                 DISCHARGE SUMMARY   Patient is a 30 year old gravida 3, para 1-0-1-1, EDC Feb 08, 2005.  She was  admitted with nausea and vomiting since Friday prior to admission.  Also she  had premature contractions.   On examination, she was 34-week size and she received magnesium sulfate 4 g  loading, 2 g per hour and by January 01, 2005, her contractions had stopped.  She was discharged home on January 02, 2005, on terbutaline 2.5 mg p.o. q.4h.   LABORATORY DATA:  Group B Strep was negative.  Urinalysis was negative.   DISCHARGE DIAGNOSIS:  Status post hospitalization for premature contractions  at 34 weeks and gastroenteritis.      BAM/MEDQ  D:  01/25/2005  T:  01/25/2005  Job:  [id number]

## 2011-02-03 NOTE — Discharge Summary (Signed)
NAME:  Christina [name], Christina [name]                        ACCOUNT NO.:  [id number]   MEDICAL RECORD NO.:  [id number]                   PATIENT TYPE:  INP   LOCATION:  9306                                 FACILITY:  [facility]   PHYSICIAN:  Phil D. Okey Dupre, M.D.                  DATE OF BIRTH:  03/08/1981   DATE OF ADMISSION:  05/21/2002  DATE OF DISCHARGE:  05/23/2002                                 DISCHARGE SUMMARY   INTERN:  Alvira Philips, M.D.   DISCHARGE DIAGNOSES:  1. Hyperemesis gravidarum.  2. Hypovolemia.  3. Headache.   1. Chronic sinusitis.   DISCHARGE INSTRUCTIONS:  The patient was discharged with preterm labor  precautions.   DISCHARGE MEDICATIONS:  1. Amoxicillin 875 mg one tablet by mouth b.i.d. x12 days.  2. Colace stool softener 100 mg one tablet by mouth p.r.n. constipation.   DIET:  No restrictions.   ACTIVITY:  No restrictions; no restrictions on sexual activity.   FOLLOW-UP:  The patient was to follow up at [facility] on her next  appointment on June 18, 2002 and follow up on June 11, 2002 for  repeat ultrasound.   DISPOSITION:  The patient was discharged to home in stable condition.   BRIEF HISTORY AND PHYSICAL:  The patient was a 30 year old G2 P0-0-1-0 at  the time of admission at estimated gestational age of 69 and four-sevenths  by ultrasound.  The patient was admitted for nausea, vomiting, and headache.  The patient had vomited approximately five to six times at day of admission  and had headache with blurry vision, given Fioricet at the clinic today at  day of admission and which she could not keep down.  She has had multiple  admissions for hyperemesis with her last admission on May 14, 2002.  The  patient was vomiting in the exam room, was also complaining of nasal  congestion and frontal headache.  The patient is on Fioricet, Phenergan, and  Tylenol Extra Strength.  The patient had an abortion with her previous  pregnancy, a total abortion.   The patient also had abnormal Pap smears per  her gynecological history.  On physical exam her admission vital signs were  temperature 99.6, pulse 119, respirations 20, blood pressure 93/60.  The  patient had tender frontal sinuses.  Her lungs were clear to auscultation.  Her heart rate exam was regular rhythm but tachycardic.  She had supple neck  with some cervical lymphadenopathy.  Her cervical exam was long and closed  with some white discharge.  Uterus was approximately 16 weeks in size.  Specific gravity on her UA was 1.035, hemoglobin 11.4, and a white count of  12.6.   HOSPITAL COURSE:  The patient was admitted with a diagnosis of hyperemesis  and headache and was started on Zofran 4 mg IV and IV hydration.  Her diet  was advanced as tolerated, activity was ad lib.  The patient was also  started on ampicillin 2 g IV q.6h. for presumed sinusitis.  On hospital day  #2 the patient had increased sinus drainage with clear rhinorrhea today but  decreased sinus pressure, was afebrile.  No nausea or emesis since  admission.  The patient was interested in the possibility of a Zofran pump  but unable to obtain secondary to coverage by Medicaid.  The patient's blood  pressure was 85/50 on hospital day #2.  The patient was tolerating a soft  and clear diet x24 hours and was continued to receive IV hydration with  potassium supplementation.  At time of discharge the patient had no further  emesis or nausea.  She had denied any sinus pressure.  She was afebrile  since the time of admission with blood pressure at 84/46 and a heart rate of  84.  The patient continued to have no frontal or maxillary tenderness, no  lymphadenopathy, and had mucous membranes that were moist and pink.  Lung  exam was clear to auscultation bilaterally.  The patient had been treated  with two days of IV ampicillin and was discharged on amoxicillin 875 b.i.d.  for the remaining 12-day course.  The patient's nausea and  vomiting was  treated with Zofran.  Fetal heart rate at the time of discharge was 150s to  160s and stable.  The patient was tolerating a good p.o. diet and activity  as well.  She will follow up with her normal [facility] appointment on  June 18, 2002 and an ultrasound on June 11, 2002.  The patient was  also encouraged to have small frequent meals with bland food until her  nausea and vomiting resolved.  The patient was also given Colace  prescription for decreased bowel movements and constipation.  The patient  was given a prescription with her due date written on it so that she could  apply for Medicaid.  At the time of discharge the patient was discharged in  a stable and improved condition and verbalized understanding of her follow-  up visits.     Alvira Philips, M.D.                      Phil D. Okey Dupre, M.D.    RM/MEDQ  D:  07/06/2002  T:  07/07/2002  Job:  [id number]

## 2011-02-03 NOTE — Op Note (Signed)
[name], Christina [name]              ACCOUNT NO.:  [id number]   MEDICAL RECORD NO.:  [id number]          PATIENT TYPE:  INP   LOCATION:  9114                          FACILITY:  [facility]   PHYSICIAN:  Kathreen Cosier, M.D.DATE OF BIRTH:  1981/03/03   DATE OF PROCEDURE:  02/03/2005  DATE OF DISCHARGE:                                 OPERATIVE REPORT   PREOPERATIVE DIAGNOSIS:  Nonreassuring fetal heart rate tracing.   SURGEON:  Kathreen Cosier, M.D.   ANESTHESIA:  Epidural.   PROCEDURE:  The patient placed on the operating table in supine position.  Abdomen prepped and draped, bladder emptied with a Foley catheter.  A  transverse suprapubic incision made, carried down to the rectus fascia.  The  fascia cleaned and incised the length of the incision.  The recti muscles  were retracted laterally, the peritoneum incised longitudinally.  A  transverse incision made in the visceral peritoneum above the bladder.  The  bladder mobilized inferiorly.  A transverse lower uterine incision made.  The patient delivered from the OP position of a female, Apgar 3 and 9.  There  was a nuchal cord present.  The baby weighed 6 pounds 1 ounce.  Cord pH was  __________.  The placenta was posterior, removed manually and sent to  pathology.  The uterine cavity cleaned with dry laps.  The uterine incision  closed in one layer with continuous suture of #1 chromic.  Hemostasis was  satisfactory.  The bladder flap reattached with 2-0 chromic.  The uterus  well-contracted, tubes and ovaries normal.  The abdomen closed in layers,  peritoneum with continuous suture of 0 chromic, fascia with continuous  suture of 0 Dexon and the skin closed with staples.  Blood loss 500 mL.      BAM/MEDQ  D:  02/03/2005  T:  02/04/2005  Job:  [id number]

## 2011-02-03 NOTE — Discharge Summary (Signed)
Christina [name], Christina [name]              ACCOUNT NO.:  [id number]   MEDICAL RECORD NO.:  [id number]          PATIENT TYPE:  INP   LOCATION:  9114                          [facility]ACILITY:  [facility]   PHYSICIAN:  Kathreen Cosier, M.D.DATE OF BIRTH:  01-04-1981   DATE OF ADMISSION:  02/03/2005  DATE OF DISCHARGE:  02/06/2005                                 DISCHARGE SUMMARY   HISTORY OF PRESENT ILLNESS:  The patient is a 30 year old gravida 3, para 1-  0-1-1, EDC Feb 08, 2005.  She was in for induction because of security  problems.  Her baby's father was stalking her and wanted to put her in jail.  She was admitted at 1 cm, 70%, and vertex at -2 to -3.   HOSPITAL COURSE:  She was on Pitocin and she had persistent variable  decelerations and dilated as far as 7 cm.  However, the decelerations became  more persistent and she underwent a primary low transverse cesarean section  and had a 6 pound 1 ounce female, Apgars 3 and 9, pH 7.24.  She has a tight  nuchal cord.  Postoperatively, she did well.  Her hemoglobin was 9.4.  She  was discharged on the third postoperative day ambulatory and on a regular  diet, to see me in 6 weeks.   DISCHARGE DIAGNOSES:  Status post primary low transverse cesarean section  for nonreassuring fetal heart rate tracing.      BAM/MEDQ  D:  02/06/2005  T:  02/06/2005  Job:  [id number]

## 2011-04-29 ENCOUNTER — Inpatient Hospital Stay (INDEPENDENT_AMBULATORY_CARE_PROVIDER_SITE_OTHER)
Admission: RE | Admit: 2011-04-29 | Discharge: 2011-04-29 | Disposition: A | Discharge status: Home / Self Care | Payer: Self-pay | Source: Ambulatory Visit | Attending: Emergency Medicine | Admitting: Emergency Medicine

## 2011-04-29 DIAGNOSIS — N751 Abscess of Bartholin's gland: Secondary | ICD-10-CM

## 2011-04-29 DIAGNOSIS — G43009 Migraine without aura, not intractable, without status migrainosus: Secondary | ICD-10-CM

## 2011-04-29 DIAGNOSIS — L732 Hidradenitis suppurativa: Secondary | ICD-10-CM

## 2011-04-30 LAB — HIV ANTIBODY (ROUTINE TESTING W REFLEX): HIV: NONREACTIVE

## 2011-05-01 LAB — HSV 2 ANTIBODY, IGG: HSV 2 Glycoprotein G Ab, IgG: 0.26 IV

## 2011-05-01 LAB — HERPES SIMPLEX VIRUS CULTURE: Culture: NOT DETECTED

## 2011-05-02 LAB — GC/CHLAMYDIA PROBE AMP, GENITAL
Chlamydia, DNA Probe: NEGATIVE
GC Probe Amp, Genital: NEGATIVE

## 2011-06-09 LAB — CBC
HCT: 34.6 — ABNORMAL LOW
Hemoglobin: 11.9 — ABNORMAL LOW
MCHC: 34.1
Platelets: 171
RBC: 3.7 — ABNORMAL LOW
RBC: 3.6 — ABNORMAL LOW
RDW: 12.2
WBC: 6.3
WBC: 5.9

## 2011-06-09 LAB — SAMPLE TO BLOOD BANK

## 2011-06-09 LAB — POCT PREGNANCY, URINE
Operator id: 247071
Preg Test, Ur: NEGATIVE
Preg Test, Ur: POSITIVE

## 2011-06-09 LAB — URINALYSIS, ROUTINE W REFLEX MICROSCOPIC
Glucose, UA: NEGATIVE
Hgb urine dipstick: NEGATIVE
Hgb urine dipstick: NEGATIVE
Ketones, ur: NEGATIVE
Ketones, ur: NEGATIVE
Nitrite: NEGATIVE
Nitrite: NEGATIVE
Protein, ur: NEGATIVE
Specific Gravity, Urine: <1.005 — ABNORMAL LOW
Urobilinogen, UA: 0.2
pH: 6.5

## 2011-06-09 LAB — URINE MICROSCOPIC-ADD ON

## 2011-06-09 LAB — WET PREP, GENITAL
Clue Cells Wet Prep HPF POC: NONE SEEN
Clue Cells Wet Prep HPF POC: NONE SEEN
Trich, Wet Prep: NONE SEEN
Yeast Wet Prep HPF POC: NONE SEEN

## 2011-06-09 LAB — DIFFERENTIAL
Basophils Relative: 0
Eosinophils Absolute: 0
Lymphs Abs: 1.3
Monocytes Relative: 15 — ABNORMAL HIGH
Neutro Abs: 3.6
Neutrophils Relative %: 62

## 2011-06-09 LAB — GC/CHLAMYDIA PROBE AMP, GENITAL: Chlamydia, DNA Probe: NEGATIVE

## 2011-06-09 LAB — HCG, QUANTITATIVE, PREGNANCY
hCG, Beta Chain, Quant, S: 232 — ABNORMAL HIGH
hCG, Beta Chain, Quant, S: 3538 — ABNORMAL HIGH

## 2011-06-12 LAB — CBC
HCT: 35.8 — ABNORMAL LOW
Hemoglobin: 12
MCV: 95.3
Platelets: 162
RBC: 3.61 — ABNORMAL LOW
RBC: 3.76 — ABNORMAL LOW
RDW: 13.4
WBC: 7.1
WBC: 9.9

## 2011-06-12 LAB — ABO/RH: ABO/RH(D): A POS

## 2011-06-26 LAB — URINALYSIS, ROUTINE W REFLEX MICROSCOPIC
Bilirubin Urine: NEGATIVE
Glucose, UA: NEGATIVE
Hgb urine dipstick: NEGATIVE
Specific Gravity, Urine: 1.01
pH: 6

## 2011-06-26 LAB — DIFFERENTIAL
Basophils Absolute: 0
Lymphocytes Relative: 25
Lymphs Abs: 1.8
Neutro Abs: 4.9

## 2011-06-26 LAB — WET PREP, GENITAL
Trich, Wet Prep: NONE SEEN
Yeast Wet Prep HPF POC: NONE SEEN

## 2011-06-26 LAB — CBC
HCT: 37.2
Hemoglobin: 12.4
RBC: 3.93
RDW: 12.1
WBC: 7.2

## 2011-06-26 LAB — GC/CHLAMYDIA PROBE AMP, GENITAL
Chlamydia, DNA Probe: NEGATIVE
GC Probe Amp, Genital: NEGATIVE

## 2011-06-26 LAB — HCG, QUANTITATIVE, PREGNANCY: hCG, Beta Chain, Quant, S: <2

## 2011-06-26 LAB — POCT PREGNANCY, URINE
Operator id: 220991
Preg Test, Ur: NEGATIVE

## 2011-07-03 LAB — GC/CHLAMYDIA PROBE AMP, GENITAL: Chlamydia, DNA Probe: NEGATIVE

## 2011-07-03 LAB — POCT URINALYSIS DIP (DEVICE)
Hgb urine dipstick: NEGATIVE
Ketones, ur: NEGATIVE
Protein, ur: NEGATIVE
Specific Gravity, Urine: 1.01
Urobilinogen, UA: 0.2
pH: 6

## 2011-07-03 LAB — WET PREP, GENITAL
Trich, Wet Prep: NONE SEEN
Yeast Wet Prep HPF POC: NONE SEEN

## 2011-07-03 LAB — POCT PREGNANCY, URINE: Preg Test, Ur: NEGATIVE

## 2011-07-06 LAB — URINALYSIS, ROUTINE W REFLEX MICROSCOPIC
Bilirubin Urine: NEGATIVE
Ketones, ur: 15 — AB
Nitrite: NEGATIVE
Protein, ur: NEGATIVE
pH: 6

## 2012-05-01 ENCOUNTER — Emergency Department (INDEPENDENT_AMBULATORY_CARE_PROVIDER_SITE_OTHER)
Admission: EM | Admit: 2012-05-01 | Discharge: 2012-05-01 | Disposition: A | Discharge status: Home / Self Care | Payer: BC Managed Care – PPO | Source: Home / Self Care | Attending: Emergency Medicine | Admitting: Emergency Medicine

## 2012-05-01 ENCOUNTER — Encounter (HOSPITAL_COMMUNITY): Payer: Self-pay | Admitting: Emergency Medicine

## 2012-05-01 DIAGNOSIS — M549 Dorsalgia, unspecified: Secondary | ICD-10-CM

## 2012-05-01 MED ORDER — MELOXICAM 7.5 MG PO TABS: 7.5000 mg | ORAL_TABLET | Freq: Every day | ORAL | Status: DC

## 2012-05-01 MED ORDER — CYCLOBENZAPRINE HCL 10 MG PO TABS: 10.0000 mg | ORAL_TABLET | Freq: Three times a day (TID) | ORAL | Status: AC

## 2012-05-01 NOTE — ED Provider Notes (Signed)
History     CSN: [id number]  Arrival date & time 05/01/12  1126   First MD Initiated Contact with Patient 05/01/12 1132      Chief Complaint  Patient presents with  . Back Pain    (Consider location/radiation/quality/duration/timing/severity/associated sxs/prior treatment) HPI Comments: Patient presents urgent care complaining of ongoing back pain. That has gotten progressively worse released last 2 weeks. She feels that she's been working hard and that has exacerbated her pain. Pain is located in the lower aspect of back. Gets worse with certain movements and portion. Denies any other symptoms such as numbness, tingling sensation, weakness of her lower extremities, no urinary symptoms. Patient also denies any constitutional symptoms such as fevers, generalized malaise, myalgias, unintentional weight loss.  Patient is a 31 y.o. female presenting with back pain. The history is provided by the patient.  Back Pain  This is a new problem. The problem occurs constantly. The problem has not changed since onset.The pain is associated with no known injury. The pain is at a severity of 6/10. The symptoms are aggravated by bending, twisting and certain positions. Stiffness is present all day. Pertinent negatives include no fever, no numbness, no abdominal swelling, no bowel incontinence, no dysuria, no pelvic pain, no paresthesias, no paresis, no tingling and no weakness. The treatment provided no relief.    History reviewed. No pertinent past medical history.  History reviewed. No pertinent past surgical history.  No family history on file.  History  Substance Use Topics  . Smoking status: Not on file  . Smokeless tobacco: Not on file  . Alcohol Use: No    OB History    Grav Para Term Preterm Abortions TAB SAB Ect Mult Living                  Review of Systems  Constitutional: Positive for activity change. Negative for fever, chills, diaphoresis, appetite change and fatigue.    Gastrointestinal: Negative for bowel incontinence.  Genitourinary: Negative for dysuria and pelvic pain.  Musculoskeletal: Positive for back pain.  Skin: Negative for rash and wound.  Neurological: Negative for dizziness, tingling, weakness, numbness and paresthesias.    Allergies  Review of patient's allergies indicates no known allergies.  Home Medications   Current Outpatient Rx  Name Route Sig Dispense Refill  . CYCLOBENZAPRINE HCL 10 MG PO TABS Oral Take 1 tablet (10 mg total) by mouth 3 (three) times daily after meals. 20 tablet 0  . MELOXICAM 7.5 MG PO TABS Oral Take 1 tablet (7.5 mg total) by mouth daily. 14 tablet 0    BP 94/61  Pulse 87  Temp 98.7 F (37.1 C) (Oral)  Resp 18  SpO2 100%  Physical Exam  Nursing note and vitals reviewed. Constitutional: She appears well-developed and well-nourished.  Musculoskeletal: She exhibits tenderness.       Lumbar back: She exhibits tenderness and pain. She exhibits no swelling, no edema, no deformity and no laceration.       Back:  Skin: No rash noted. No erythema.    ED Course  Procedures (including critical care time)  Labs Reviewed - No data to display No results found.   1. Back pain       MDM  Patient with bilateral paraspinal lumbar paravertebral pain elicited with exam. Patient showed no neuromuscular deficits nor symptomatology to suggest cauda equina syndrome. Patient works as a Lawyer. Have been prescribed  a course of a cox-2  and a muscle relaxer. Encouraged her to  exercise her back and provided prints out for back exercises. Recommeded her to followup with orthopedic doctor pain was to persist beyond 4-6 weeks. Have also discussed with patient was symptoms should warrant further evaluation sooner. She agrees with treatment plan and followup care as necessary.        Jimmie Molly, MD 05/01/12 2018

## 2012-05-01 NOTE — ED Notes (Signed)
Pt. Stated, I think I've been working too much at work and my back is really hurting.

## 2012-07-08 ENCOUNTER — Emergency Department (HOSPITAL_COMMUNITY)
Admission: EM | Admit: 2012-07-08 | Discharge: 2012-07-08 | Disposition: A | Discharge status: Home / Self Care | Payer: BC Managed Care – PPO | Attending: Emergency Medicine | Admitting: Emergency Medicine

## 2012-07-08 ENCOUNTER — Encounter (HOSPITAL_COMMUNITY): Payer: Self-pay | Admitting: *Deleted

## 2012-07-08 DIAGNOSIS — R51 Headache: Secondary | ICD-10-CM | POA: Insufficient documentation

## 2012-07-08 DIAGNOSIS — R110 Nausea: Secondary | ICD-10-CM | POA: Insufficient documentation

## 2012-07-08 DIAGNOSIS — H53149 Visual discomfort, unspecified: Secondary | ICD-10-CM | POA: Insufficient documentation

## 2012-07-08 HISTORY — DX: Migraine, unspecified, not intractable, without status migrainosus: G43.909

## 2012-07-08 MED ORDER — DEXAMETHASONE SODIUM PHOSPHATE 10 MG/ML IJ SOLN
10.0000 mg | Freq: Once | INTRAMUSCULAR | Status: AC
  Administered 2012-07-08: 10 mg via INTRAVENOUS
  Filled 2012-07-08: qty 1

## 2012-07-08 MED ORDER — SODIUM CHLORIDE 0.9 % IV BOLUS (SEPSIS)
1000.0000 mL | Freq: Once | INTRAVENOUS | Status: AC
  Administered 2012-07-08: 1000 mL via INTRAVENOUS

## 2012-07-08 MED ORDER — DIPHENHYDRAMINE HCL 50 MG/ML IJ SOLN
12.5000 mg | Freq: Once | INTRAMUSCULAR | Status: AC
  Administered 2012-07-08: 12.5 mg via INTRAVENOUS
  Filled 2012-07-08: qty 1

## 2012-07-08 MED ORDER — KETOROLAC TROMETHAMINE 30 MG/ML IJ SOLN
30.0000 mg | Freq: Once | INTRAMUSCULAR | Status: AC
  Administered 2012-07-08: 30 mg via INTRAVENOUS
  Filled 2012-07-08: qty 1

## 2012-07-08 MED ORDER — METOCLOPRAMIDE HCL 5 MG/ML IJ SOLN
10.0000 mg | Freq: Once | INTRAMUSCULAR | Status: AC
  Administered 2012-07-08: 10 mg via INTRAVENOUS
  Filled 2012-07-08: qty 2

## 2012-07-08 NOTE — ED Notes (Signed)
Pt states that she is getting HA and sharp pain that occurs intermittently in her head x 3d. Pt states that she began having HA when she went off her Depo Provera in Feb.

## 2012-07-08 NOTE — ED Notes (Signed)
Pt c/o migraine headaches for over a year; stop depo shots thinking that would help; no better; no period since last injection in Feb; spotting last wk; severe headache since Fri with light sensitivity

## 2012-07-08 NOTE — ED Provider Notes (Signed)
Medical screening examination/treatment/procedure(s) were performed by non-physician practitioner and as supervising physician I was immediately available for consultation/collaboration.  Doug Sou, MD 07/08/12 [phone]

## 2012-07-08 NOTE — ED Provider Notes (Signed)
History     CSN: [id number]  Arrival date & time 07/08/12  6295   First MD Initiated Contact with Patient 07/08/12 [phone]      Chief Complaint  Patient presents with  . Migraine    (Consider location/radiation/quality/duration/timing/severity/associated sxs/prior treatment) HPI  31 year old female with history of migraine presents complaining of headache. Pt reports she has been having recurrent headache for over a year.  Usually experienced headache twice weekly.  Headache usually lasting for 1-2 days.  She reports having L sided headache for the past 3-4 days.  Headache is throbbing, gradual onset, persistent, with associated phonophobia, photophobia, and nausea.  Headache felt similar to the past.  Has used home meds include maxalt, and hydrocodone without relief.  Denies fever, neck stiffness, numbness, weakness, or rash.  Pt initially thought here headache was due to taking depo shot.  She has stopped using it for 6 months but headache still persists.      Past Medical History  Diagnosis Date  . Migraine     Past Surgical History  Procedure Date  . Cesarean section     No family history on file.  History  Substance Use Topics  . Smoking status: Never Smoker   . Smokeless tobacco: Not on file  . Alcohol Use: No    OB History    Grav Para Term Preterm Abortions TAB SAB Ect Mult Living                  Review of Systems  Constitutional: Negative for fever.  HENT: Negative for neck stiffness.   Skin: Negative for rash.  Neurological: Negative for numbness.    Allergies  Review of patient's allergies indicates no known allergies.  Home Medications   Current Outpatient Rx  Name Route Sig Dispense Refill  . MELOXICAM 7.5 MG PO TABS Oral Take 1 tablet (7.5 mg total) by mouth daily. 14 tablet 0    BP 120/61  Pulse 116  Temp 98.7 F (37.1 C)  Resp 20  SpO2 100%  Physical Exam  Nursing note and vitals reviewed. Constitutional: She appears  well-developed and well-nourished. No distress.       Awake, alert, nontoxic appearance  HENT:  Head: Atraumatic.  Eyes: Conjunctivae normal are normal. Right eye exhibits no discharge. Left eye exhibits no discharge.  Neck: Neck supple. No rigidity.  Cardiovascular: Normal rate and regular rhythm.   Pulmonary/Chest: Effort normal. No respiratory distress. She exhibits no tenderness.  Abdominal: Soft. There is no tenderness. There is no rebound.  Musculoskeletal: She exhibits no tenderness.       ROM appears intact, no obvious focal weakness  Neurological: GCS eye subscore is 4. GCS verbal subscore is 5. GCS motor subscore is 6.       Mental status and motor strength appears intact  Skin: No rash noted.  Psychiatric: She has a normal mood and affect.    ED Course  Procedures (including critical care time)  Labs Reviewed - No data to display No results found.   No diagnosis found.  1. migraine  MDM  Pt with hx of headache presents with recurrent headache.  She has no nuchal rigidity concerning for meningitis, and no gross neuro deficits.  Pt does not appears toxic.  Therefore, will give migraine cocktail.  Pt is tachycardic, will give IVF.    I also warn pt about the risk of rebound headache from using narcotic meds for headache. She voice understanding. My attending is aware.  8:14 AM Pt reports improvement in her headache.  VSS.  Will d/c.    BP 118/52  Pulse 88  Temp 98.7 F (37.1 C)  Resp 18  SpO2 100%      Fayrene Helper, PA-C 07/08/12 0818

## 2012-09-07 ENCOUNTER — Inpatient Hospital Stay (HOSPITAL_COMMUNITY)
Admission: AD | Admit: 2012-09-07 | Discharge: 2012-09-07 | Disposition: A | Discharge status: Home / Self Care | Payer: BC Managed Care – PPO | Source: Ambulatory Visit | Attending: Obstetrics & Gynecology | Admitting: Obstetrics & Gynecology

## 2012-09-07 ENCOUNTER — Encounter (HOSPITAL_COMMUNITY): Payer: Self-pay

## 2012-09-07 DIAGNOSIS — B9689 Other specified bacterial agents as the cause of diseases classified elsewhere: Secondary | ICD-10-CM | POA: Insufficient documentation

## 2012-09-07 DIAGNOSIS — R51 Headache: Secondary | ICD-10-CM

## 2012-09-07 DIAGNOSIS — B3731 Acute candidiasis of vulva and vagina: Secondary | ICD-10-CM | POA: Insufficient documentation

## 2012-09-07 DIAGNOSIS — A499 Bacterial infection, unspecified: Secondary | ICD-10-CM | POA: Insufficient documentation

## 2012-09-07 DIAGNOSIS — N760 Acute vaginitis: Secondary | ICD-10-CM

## 2012-09-07 DIAGNOSIS — G43909 Migraine, unspecified, not intractable, without status migrainosus: Secondary | ICD-10-CM | POA: Insufficient documentation

## 2012-09-07 DIAGNOSIS — N949 Unspecified condition associated with female genital organs and menstrual cycle: Secondary | ICD-10-CM | POA: Insufficient documentation

## 2012-09-07 DIAGNOSIS — B373 Candidiasis of vulva and vagina: Secondary | ICD-10-CM

## 2012-09-07 LAB — URINALYSIS, ROUTINE W REFLEX MICROSCOPIC
Glucose, UA: NEGATIVE mg/dL
Hgb urine dipstick: NEGATIVE
Ketones, ur: NEGATIVE mg/dL
Leukocytes, UA: NEGATIVE
pH: 6 (ref 5.0–8.0)

## 2012-09-07 LAB — WET PREP, GENITAL
Trich, Wet Prep: NONE SEEN
Yeast Wet Prep HPF POC: NONE SEEN

## 2012-09-07 MED ORDER — IBUPROFEN 800 MG PO TABS: 800.0000 mg | ORAL_TABLET | Freq: Three times a day (TID) | ORAL | Status: DC | PRN

## 2012-09-07 MED ORDER — METRONIDAZOLE 0.75 % VA GEL: 1.0000 | Freq: Two times a day (BID) | VAGINAL | Status: AC

## 2012-09-07 MED ORDER — FLUCONAZOLE 150 MG PO TABS: 150.0000 mg | ORAL_TABLET | Freq: Once | ORAL | Status: DC

## 2012-09-07 MED ORDER — KETOROLAC TROMETHAMINE 60 MG/2ML IM SOLN
60.0000 mg | Freq: Once | INTRAMUSCULAR | Status: AC
  Administered 2012-09-07: 60 mg via INTRAMUSCULAR
  Filled 2012-09-07: qty 2

## 2012-09-07 NOTE — MAU Note (Signed)
Pt c/o migraines.  Also reports having a new sex partner. Condom broke having increased yellow discharge and odor. C/O irregular periods. Had been on depo.

## 2012-09-07 NOTE — MAU Provider Note (Signed)
History     CSN: [id number]  Arrival date and time: 09/07/12 1627   First Provider Initiated Contact with Patient 09/07/12 1715      Chief Complaint  Patient presents with  . Migraine  . Vaginal Discharge   HPI 31 y.o. [id number] with vaginal discharge, odor, itching. Also c/o migraine headache, little relief with excedrin, states toradol usually helps. Has h/o migraines - has maxalt at home, but doesn't like to take because it makes her "feel funny".    Past Medical History  Diagnosis Date  . Migraine     Past Surgical History  Procedure Date  . Cesarean section     No family history on file.  History  Substance Use Topics  . Smoking status: Never Smoker   . Smokeless tobacco: Not on file  . Alcohol Use: No    Allergies: No Known Allergies  Prescriptions prior to admission  Medication Sig Dispense Refill  . aspirin-acetaminophen-caffeine (EXCEDRIN MIGRAINE) 250-250-65 MG per tablet Take 2 tablets by mouth every 6 (six) hours as needed. For migraines        Review of Systems  Constitutional: Negative.   Respiratory: Negative.   Cardiovascular: Negative.   Gastrointestinal: Negative for nausea, vomiting, abdominal pain, diarrhea and constipation.  Genitourinary: Negative for dysuria, urgency, frequency, hematuria and flank pain.       Positive for vaginal discharge   Musculoskeletal: Negative.   Neurological: Positive for headaches.  Psychiatric/Behavioral: Negative.    Physical Exam   Blood pressure 107/60, pulse 86, temperature 98.6 F (37 C), temperature source Oral, resp. rate 18, height 5\' 2"  (1.575 m), weight 116 lb 6.4 oz (52.799 kg).  Physical Exam  Nursing note and vitals reviewed. Constitutional: She is oriented to person, place, and time. She appears well-developed and well-nourished. No distress.  Cardiovascular: Normal rate.   Respiratory: Effort normal.  GI: Soft. There is no tenderness.  Genitourinary: Uterus is not enlarged and not  tender. Cervix exhibits no motion tenderness. Right adnexum displays no mass, no tenderness and no fullness. Left adnexum displays no mass, no tenderness and no fullness. No bleeding around the vagina. Vaginal discharge (curdlike) found.  Musculoskeletal: Normal range of motion.  Neurological: She is alert and oriented to person, place, and time.  Skin: Skin is warm and dry.  Psychiatric: She has a normal mood and affect.    MAU Course  Procedures  Results for orders placed during the hospital encounter of 09/07/12 (from the past 24 hour(s))  URINALYSIS, ROUTINE W REFLEX MICROSCOPIC     Status: Abnormal   Collection Time   09/07/12  4:35 PM      Component Value Range   Color, Urine YELLOW  YELLOW   APPearance CLEAR  CLEAR   Specific Gravity, Urine <1.005 (*) 1.005 - 1.030   pH 6.0  5.0 - 8.0   Glucose, UA NEGATIVE  NEGATIVE mg/dL   Hgb urine dipstick NEGATIVE  NEGATIVE   Bilirubin Urine NEGATIVE  NEGATIVE   Ketones, ur NEGATIVE  NEGATIVE mg/dL   Protein, ur NEGATIVE  NEGATIVE mg/dL   Urobilinogen, UA 0.2  0.0 - 1.0 mg/dL   Nitrite NEGATIVE  NEGATIVE   Leukocytes, UA NEGATIVE  NEGATIVE  WET PREP, GENITAL     Status: Abnormal   Collection Time   09/07/12  5:00 PM      Component Value Range   Yeast Wet Prep HPF POC NONE SEEN  NONE SEEN   Trich, Wet Prep NONE SEEN  NONE SEEN  Clue Cells Wet Prep HPF POC FEW (*) NONE SEEN   WBC, Wet Prep HPF POC FEW (*) NONE SEEN   Toradol 60 mg IM given in MAU for headache  Assessment and Plan   1. Headache   2. BV (bacterial vaginosis)   3. Yeast vaginitis       Medication List     As of 09/07/2012  5:58 PM    START taking these medications         fluconazole 150 MG tablet   Commonly known as: DIFLUCAN   Take 1 tablet (150 mg total) by mouth once.      ibuprofen 800 MG tablet   Commonly known as: ADVIL,MOTRIN   Take 1 tablet (800 mg total) by mouth every 8 (eight) hours as needed for pain.      metroNIDAZOLE 0.75 % vaginal  gel   Commonly known as: METROGEL   Place 1 Applicatorful vaginally 2 (two) times daily.      STOP taking these medications         aspirin-acetaminophen-caffeine 250-250-65 MG per tablet   Commonly known as: EXCEDRIN MIGRAINE          Where to get your medications    These are the prescriptions that you need to pick up. We sent them to a specific pharmacy, so you will need to go there to get them.   [id number] - [place], [place] - [address]    [address] [place] [address]    Phone: [phone]        fluconazole 150 MG tablet   ibuprofen 800 MG tablet   metroNIDAZOLE 0.75 % vaginal gel            Follow-up Information    Please follow up. (your doctor as needed)            Dierks Wach 09/07/2012, 5:30 PM

## 2012-09-09 ENCOUNTER — Inpatient Hospital Stay (HOSPITAL_COMMUNITY)
Admission: AD | Admit: 2012-09-09 | Discharge: 2012-09-09 | Disposition: A | Discharge status: Home / Self Care | Payer: BC Managed Care – PPO | Source: Ambulatory Visit | Attending: Obstetrics & Gynecology | Admitting: Obstetrics & Gynecology

## 2012-09-09 ENCOUNTER — Encounter (HOSPITAL_COMMUNITY): Payer: Self-pay | Admitting: Family

## 2012-09-09 DIAGNOSIS — G43909 Migraine, unspecified, not intractable, without status migrainosus: Secondary | ICD-10-CM | POA: Insufficient documentation

## 2012-09-09 LAB — GC/CHLAMYDIA PROBE AMP: CT Probe RNA: NEGATIVE

## 2012-09-09 MED ORDER — KETOROLAC TROMETHAMINE 60 MG/2ML IM SOLN
60.0000 mg | Freq: Once | INTRAMUSCULAR | Status: AC
  Administered 2012-09-09: 60 mg via INTRAMUSCULAR
  Filled 2012-09-09: qty 2

## 2012-09-09 NOTE — MAU Provider Note (Signed)
  History     CSN: [id number]  Arrival date and time: 09/09/12 4098   First Provider Initiated Contact with Patient 09/09/12 0  Chief Complaint  Patient presents with  . Headache   HPI  Pt is not pregnant and presents with history of Migraine headaches.  Pt had Depo Provera with last injection February. Pt states her headaches have improved since she went off of Depo.  She takes Maxalt for her headaches and also takes Vicodin prn.  Pt is light sensitive and nauseated.  The headache is left occipital.  Pt denies blurred vision. Pt was seen here on Sat with female problems and was given Toradol for headache which improved but didn't completely get rid of headache.  Pt is in process of getting PCP.  Past Medical History  Diagnosis Date  . Migraine     Past Surgical History  Procedure Date  . Cesarean section     History reviewed. No pertinent family history.  History  Substance Use Topics  . Smoking status: Never Smoker   . Smokeless tobacco: Not on file  . Alcohol Use: No    Allergies: No Known Allergies  Prescriptions prior to admission  Medication Sig Dispense Refill  . ibuprofen (ADVIL,MOTRIN) 800 MG tablet Take 1 tablet (800 mg total) by mouth every 8 (eight) hours as needed for pain.  30 tablet  0  . metroNIDAZOLE (METROGEL VAGINAL) 0.75 % vaginal gel Place 1 Applicatorful vaginally 2 (two) times daily.  70 g  0  . [DISCONTINUED] fluconazole (DIFLUCAN) 150 MG tablet Take 1 tablet (150 mg total) by mouth once.  1 tablet  0    Review of Systems  Constitutional: Negative for fever and chills.  HENT: Negative for hearing loss, ear pain and tinnitus.   Eyes: Positive for photophobia. Negative for blurred vision.  Respiratory: Negative for cough.   Gastrointestinal: Positive for nausea. Negative for vomiting and abdominal pain.  Genitourinary: Negative for dysuria.  Neurological: Positive for headaches.   Physical Exam   Blood pressure 103/61, pulse 94, temperature  98.2 F (36.8 C), temperature source Oral, resp. rate 16, height 5' 1.5" (1.562 m), weight 116 lb (52.617 kg), last menstrual period 08/27/2012.  Physical Exam  Vitals reviewed. Constitutional: She is oriented to person, place, and time. She appears well-developed and well-nourished.  HENT:  Head: Normocephalic.  Eyes: Pupils are equal, round, and reactive to light.  Neck: Normal range of motion. Neck supple.  Cardiovascular: Normal rate.   Respiratory: Effort normal.  GI: Soft.  Musculoskeletal: Normal range of motion.  Neurological: She is alert and oriented to person, place, and time.  Skin: Skin is warm and dry.  Psychiatric: She has a normal mood and affect.    MAU Course  Procedures Pt got relief with Toradol and ready to go home- pt is driving Pt has prescription for Ibuprofen 800mg  as well as Maxalt Will give pt work note until 09/12/2012- pt under much stress with mother- stroke ; father of children in [place] and comes home 2 x year.   Assessment and Plan  Migraines F/u with PCP and then refer to [facility] Recommend Jannifer Rodney, NP if possible  Recommended counseling for stress relief  LINEBERRY,SUSAN 09/09/2012, 9:19 AM

## 2012-09-09 NOTE — MAU Note (Signed)
Patient was seen here on Saturday for migraine; some relief but never completely alleviated. Was told to come back if headache did not get better.

## 2012-09-09 NOTE — MAU Note (Signed)
Been having increase in headaches.  Here this weekend for female problems- was given an injection and told to come  Back if it continues.  Pt is under a lot of stress,  Mother had a stroke before Thanksgiving, is currently living with her, her father is a cancer pt and is working full time, - VERY stressed.

## 2012-11-12 ENCOUNTER — Encounter (HOSPITAL_COMMUNITY): Payer: Self-pay

## 2012-11-12 ENCOUNTER — Inpatient Hospital Stay (HOSPITAL_COMMUNITY)
Admission: AD | Admit: 2012-11-12 | Discharge: 2012-11-12 | Disposition: A | Discharge status: Home / Self Care | Payer: Managed Care, Other (non HMO) | Source: Ambulatory Visit | Attending: Obstetrics & Gynecology | Admitting: Obstetrics & Gynecology

## 2012-11-12 DIAGNOSIS — R1031 Right lower quadrant pain: Secondary | ICD-10-CM | POA: Insufficient documentation

## 2012-11-12 DIAGNOSIS — N912 Amenorrhea, unspecified: Secondary | ICD-10-CM | POA: Insufficient documentation

## 2012-11-12 LAB — URINALYSIS, ROUTINE W REFLEX MICROSCOPIC
Bilirubin Urine: NEGATIVE
Hgb urine dipstick: NEGATIVE
Specific Gravity, Urine: 1.02 (ref 1.005–1.030)
Urobilinogen, UA: 0.2 mg/dL (ref 0.0–1.0)
pH: 6 (ref 5.0–8.0)

## 2012-11-12 LAB — POCT PREGNANCY, URINE: Preg Test, Ur: NEGATIVE

## 2012-11-12 LAB — WET PREP, GENITAL: Trich, Wet Prep: NONE SEEN

## 2012-11-12 NOTE — MAU Provider Note (Signed)
Attestation of Attending Supervision of Advanced Practitioner (CNM/NP): Evaluation and management procedures were performed by the Advanced Practitioner under my supervision and collaboration.  I have reviewed the Advanced Practitioner's note and chart, and I agree with the management and plan.  HARRAWAY-SMITH, Zakariye Nee 3:06 PM

## 2012-11-12 NOTE — MAU Provider Note (Signed)
History    CSN: [id number]  Arrival date and time: 11/12/12 4098   First Provider Initiated Contact with Patient 11/12/12 1013      Chief Complaint  Patient presents with  . Abdominal Pain  . Vaginal Bleeding    HPI Christina [name] is a 32 yo female who presents to the MAU complaining of irregular bleeding and abdominal cramping. She reports that she has always had very regular periods that last about a full week, and that they usually always start on the 10th of each month. However, this month she states her period came for 4 days on the 2nd and were much lighter than normal. She reports that about a week ago she also had some mild spotting for a day. She is concerned that her period was irregular this month because they have always been so regular. She had her last Depo shot in February 2013, and decided to not get the shots anymore. She reports she has had 4-5 regular periods since the Depo shot. She denies any abnormal vaginal discharge, lesions, or itching. She is not concerned about STIs and has had the same partner for 6 years.  She also complains of mild RLQ abdominal cramping for a few days that comes and goes. She describes the pain as dull and reports that nothing makes the pain worse, but that ibuprofen makes the pain better. She reports mild nausea over the last week, and denies vomiting, diarrhea or constipation. She also denies fever or any sick contacts. She denies any change in appetite and it staying well hydrated.  OB History   Grav Para Term Preterm Abortions TAB SAB Ect Mult Living   2 2 2       2       Past Medical History  Diagnosis Date  . Migraine     Past Surgical History  Procedure Laterality Date  . Cesarean section      No family history on file.  History  Substance Use Topics  . Smoking status: Never Smoker   . Smokeless tobacco: Not on file  . Alcohol Use: No    Allergies: No Known Allergies  No prescriptions prior to admission    Review  of Systems  Constitutional: Negative for fever.  Eyes: Negative for blurred vision and double vision.  Respiratory: Negative for cough and wheezing.   Gastrointestinal: Positive for nausea and abdominal pain (RLQ cramping). Negative for heartburn, vomiting, diarrhea and constipation.  Genitourinary: Negative for dysuria, urgency and frequency.  Musculoskeletal: Negative for back pain.  Neurological: Positive for headaches. Negative for dizziness.  Psychiatric/Behavioral: Negative for depression. The patient is not nervous/anxious.     Physical Exam   Blood pressure 109/59, pulse 99, temperature 98.2 F (36.8 C), temperature source Oral, resp. rate 16, height 5\' 3"  (1.6 m), weight 53.581 kg (118 lb 2 oz), last menstrual period 10/20/2012.  Physical Exam  HENT:  Head: Normocephalic.  Eyes: EOM are normal.  Neck: Neck supple.  Musculoskeletal: Normal range of motion. She exhibits no edema.  Psychiatric: She has a normal mood and affect. Her behavior is normal. Judgment and thought content normal.   General appearance - alert, well appearing, and in no distress and oriented to person, place, and time Chest - clear to auscultation, no wheezes, rales or rhonchi, symmetric air entry Heart - normal rate, regular rhythm, normal S1, S2, no murmurs, rubs, clicks or gallops Abdomen - soft, mild tenderness in RLQ, no rebound tenderness or guarding, nondistended, no masses or  organomegaly, bowel sounds normal Pelvic - VULVA: normal appearing vulva with no masses, tenderness or lesions, VAGINA: normal appearing vagina with normal color, no lesions, vaginal discharge - white and thick, CERVIX: normal appearing cervix without lesions, cervical discharge present - white, UTERUS: uterus is normal size, shape, consistency and nontender, ADNEXA: normal adnexa in size, nontender and no masses Extremities - peripheral pulses normal, no pedal edema, no clubbing or cyanosis  MAU Course  Procedures  Results  for orders placed during the hospital encounter of 11/12/12 (from the past 24 hour(s))  URINALYSIS, ROUTINE W REFLEX MICROSCOPIC     Status: None   Collection Time    11/12/12  9:43 AM      Result Value Range   Color, Urine YELLOW  YELLOW   APPearance CLEAR  CLEAR   Specific Gravity, Urine 1.020  1.005 - 1.030   pH 6.0  5.0 - 8.0   Glucose, UA NEGATIVE  NEGATIVE mg/dL   Hgb urine dipstick NEGATIVE  NEGATIVE   Bilirubin Urine NEGATIVE  NEGATIVE   Ketones, ur NEGATIVE  NEGATIVE mg/dL   Protein, ur NEGATIVE  NEGATIVE mg/dL   Urobilinogen, UA 0.2  0.0 - 1.0 mg/dL   Nitrite NEGATIVE  NEGATIVE   Leukocytes, UA NEGATIVE  NEGATIVE  POCT PREGNANCY, URINE     Status: None   Collection Time    11/12/12  9:48 AM      Result Value Range   Preg Test, Ur NEGATIVE  NEGATIVE  WET PREP, GENITAL     Status: Abnormal   Collection Time    11/12/12 10:50 AM      Result Value Range   Yeast Wet Prep HPF POC NONE SEEN  NONE SEEN   Trich, Wet Prep NONE SEEN  NONE SEEN   Clue Cells Wet Prep HPF POC NONE SEEN  NONE SEEN   WBC, Wet Prep HPF POC FEW (*) NONE SEEN     Assessment and Plan  Assessment: Christina [name] is a 32 yo female who presents complaining of irregular periods and RLQ abdominal cramping.   Plan:  1. Secondary Amenorrhea: counseled patient on different causes of amenorrhea 2. Patient has an appointment with the GYN clinical [facility] next month for follow-up.   Adela Glimpse 11/12/2012, 10:30 AM   I have examined this patient with the student. The abdomen is soft and there is minimal discomfort with deep palpation. Patient describes the pain that she has had like cramping before her period. If her symptoms worsen, she will return for further evaluation. If she does not start her period in the next week she will repeat a pregnancy test.  Kerrie Buffalo, RN, FNP, [facility]

## 2012-11-12 NOTE — MAU Note (Signed)
Pt states LMP-10/20/2012, having right lower abd pain, pain is intermittent, worse with mvmt. Denies abnormal vag d/c changes, however notes spotting. Denies uti s/s.

## 2012-11-13 LAB — GC/CHLAMYDIA PROBE AMP
CT Probe RNA: NEGATIVE
GC Probe RNA: NEGATIVE

## 2012-11-21 ENCOUNTER — Encounter (HOSPITAL_COMMUNITY): Payer: Self-pay | Admitting: *Deleted

## 2012-11-21 ENCOUNTER — Emergency Department (HOSPITAL_COMMUNITY)
Admission: EM | Admit: 2012-11-21 | Discharge: 2012-11-21 | Disposition: A | Discharge status: Home / Self Care | Payer: Self-pay | Attending: Emergency Medicine | Admitting: Emergency Medicine

## 2012-11-21 DIAGNOSIS — Z3202 Encounter for pregnancy test, result negative: Secondary | ICD-10-CM | POA: Insufficient documentation

## 2012-11-21 DIAGNOSIS — Z8679 Personal history of other diseases of the circulatory system: Secondary | ICD-10-CM | POA: Insufficient documentation

## 2012-11-21 DIAGNOSIS — H53149 Visual discomfort, unspecified: Secondary | ICD-10-CM | POA: Insufficient documentation

## 2012-11-21 DIAGNOSIS — Z8742 Personal history of other diseases of the female genital tract: Secondary | ICD-10-CM | POA: Insufficient documentation

## 2012-11-21 DIAGNOSIS — G43909 Migraine, unspecified, not intractable, without status migrainosus: Secondary | ICD-10-CM | POA: Insufficient documentation

## 2012-11-21 DIAGNOSIS — R110 Nausea: Secondary | ICD-10-CM | POA: Insufficient documentation

## 2012-11-21 DIAGNOSIS — R51 Headache: Secondary | ICD-10-CM

## 2012-11-21 LAB — CBC WITH DIFFERENTIAL/PLATELET
Basophils Relative: 0 % (ref 0–1)
Eosinophils Absolute: 0.1 10*3/uL (ref 0.0–0.7)
Eosinophils Relative: 1 % (ref 0–5)
Hemoglobin: 12.6 g/dL (ref 12.0–15.0)
Lymphs Abs: 1.7 10*3/uL (ref 0.7–4.0)
MCH: 30.6 pg (ref 26.0–34.0)
MCHC: 33.7 g/dL (ref 30.0–36.0)
MCV: 90.8 fL (ref 78.0–100.0)
Monocytes Relative: 13 % — ABNORMAL HIGH (ref 3–12)
Platelets: 201 10*3/uL (ref 150–400)
RBC: 4.12 MIL/uL (ref 3.87–5.11)

## 2012-11-21 LAB — BASIC METABOLIC PANEL
BUN: 8 mg/dL (ref 6–23)
Calcium: 8.5 mg/dL (ref 8.4–10.5)
Creatinine, Ser: 0.72 mg/dL (ref 0.50–1.10)
GFR calc Af Amer: >90 mL/min (ref >90)
GFR calc non Af Amer: >90 mL/min (ref >90)
Glucose, Bld: 113 mg/dL — ABNORMAL HIGH (ref 70–99)

## 2012-11-21 LAB — URINALYSIS, ROUTINE W REFLEX MICROSCOPIC
Bilirubin Urine: NEGATIVE
Ketones, ur: NEGATIVE mg/dL
Protein, ur: NEGATIVE mg/dL
Urobilinogen, UA: 0.2 mg/dL (ref 0.0–1.0)

## 2012-11-21 LAB — URINE MICROSCOPIC-ADD ON

## 2012-11-21 MED ORDER — METOCLOPRAMIDE HCL 5 MG/ML IJ SOLN
20.0000 mg | Freq: Once | INTRAVENOUS | Status: AC
  Administered 2012-11-21: 20 mg via INTRAVENOUS
  Filled 2012-11-21: qty 4

## 2012-11-21 MED ORDER — ASPIRIN-ACETAMINOPHEN-CAFFEINE 250-250-65 MG PO TABS: 1.0000 | ORAL_TABLET | Freq: Four times a day (QID) | ORAL | Status: DC | PRN

## 2012-11-21 MED ORDER — DIPHENHYDRAMINE HCL 50 MG/ML IJ SOLN
25.0000 mg | Freq: Once | INTRAMUSCULAR | Status: AC
  Administered 2012-11-21: 25 mg via INTRAVENOUS
  Filled 2012-11-21: qty 1

## 2012-11-21 MED ORDER — SODIUM CHLORIDE 0.9 % IV BOLUS (SEPSIS)
1000.0000 mL | Freq: Once | INTRAVENOUS | Status: AC
  Administered 2012-11-21: 1000 mL via INTRAVENOUS

## 2012-11-21 MED ORDER — KETOROLAC TROMETHAMINE 30 MG/ML IJ SOLN
30.0000 mg | Freq: Once | INTRAMUSCULAR | Status: AC
  Administered 2012-11-21: 30 mg via INTRAVENOUS
  Filled 2012-11-21: qty 1

## 2012-11-21 NOTE — ED Provider Notes (Signed)
History     CSN: [id number]  Arrival date & time 11/21/12  0711   First MD Initiated Contact with Patient 11/21/12 [phone]      Chief Complaint  Patient presents with  . Migraine    (Consider location/radiation/quality/duration/timing/severity/associated sxs/prior treatment) HPI  The patient p/w HA.  She has a long Hx of HA, no formal diagnosis of migraine.  She now p/w HA that began two days pta, subtly.  Since onset Sx have become severe, w no relief w OTC meds.  HA is L-sided throbbing, w photophobia, w nausea, no emesis, no falls/ confusion / discoordination / weakness / visual loss.  She has been generally well, aside from recent DUB which is improved.  She saw her OB last month.    Past Medical History  Diagnosis Date  . Migraine     Past Surgical History  Procedure Laterality Date  . Cesarean section      No family history on file.  History  Substance Use Topics  . Smoking status: Never Smoker   . Smokeless tobacco: Not on file  . Alcohol Use: No    OB History   Grav Para Term Preterm Abortions TAB SAB Ect Mult Living   2 2 2       2       Review of Systems  Constitutional:       Per HPI, otherwise negative  HENT:       Per HPI, otherwise negative  Eyes: Positive for photophobia. Negative for visual disturbance.  Respiratory:       Per HPI, otherwise negative  Cardiovascular:       Per HPI, otherwise negative  Gastrointestinal: Negative for vomiting.  Endocrine:       Negative aside from HPI  Genitourinary:       Neg aside from HPI   Musculoskeletal:       Per HPI, otherwise negative  Skin: Negative.   Neurological: Positive for headaches. Negative for syncope.    Allergies  Review of patient's allergies indicates no known allergies.  Home Medications  No current outpatient prescriptions on file.  BP 86/45  Pulse 74  Temp(Src) 97.9 F (36.6 C) (Oral)  Resp 16  SpO2 100%  LMP 11/17/2012  Physical Exam  Nursing note and vitals  reviewed. Constitutional: She is oriented to person, place, and time. She appears well-developed and well-nourished. No distress.  Well appearing young F, resting w lights off  HENT:  Head: Normocephalic and atraumatic.  Eyes: Conjunctivae and EOM are normal.  Cardiovascular: Normal rate and regular rhythm.   Pulmonary/Chest: Effort normal and breath sounds normal. No stridor. No respiratory distress.  Abdominal: She exhibits no distension.  Musculoskeletal: She exhibits no edema.  Neurological: She is alert and oriented to person, place, and time. She displays no atrophy. No cranial nerve deficit or sensory deficit. She exhibits normal muscle tone. Coordination normal.  Skin: Skin is warm and dry.  Psychiatric: She has a normal mood and affect.    ED Course  Procedures (including critical care time)  Labs Reviewed  CBC WITH DIFFERENTIAL  BASIC METABOLIC PANEL  URINALYSIS, ROUTINE W REFLEX MICROSCOPIC   No results found.   No diagnosis found.  8:55 AM Patient substantially better.  Labs wnl, w no e/o significant anemia   MDM  This patient with history of headaches, though no neurology provided the diagnosis of migraines now presents with headache that is typical for her.  Discussed pros and cons of imaging with  the patient.  Absent distress, neurologic findings, and with her endorsement of this pain as being characteristically the same at that of her typical headaches, there is no indication for emergent imaging.  With the patient's recent episode of metromenorrhagia, labs were sent, and were largely reassuring. The patient improved, was discharged in stable condition with neurology followup for additional evaluation of her headaches.       Gerhard Munch, MD 11/21/12 0900

## 2012-11-21 NOTE — ED Notes (Signed)
Pt c/o migraine to L side of head x2 days. Reports sensitivity to light and sound. +nausea, denies vomiting. Hx migraines. Has tried OTC without relief.

## 2012-12-04 ENCOUNTER — Encounter: Payer: Self-pay | Admitting: Diagnostic Neuroimaging

## 2012-12-04 ENCOUNTER — Ambulatory Visit (INDEPENDENT_AMBULATORY_CARE_PROVIDER_SITE_OTHER): Payer: Managed Care, Other (non HMO) | Admitting: Diagnostic Neuroimaging

## 2012-12-04 VITALS — BP 85/55 | HR 69 | Temp 98.3°F | Ht 63.0 in | Wt 118.0 lb

## 2012-12-04 DIAGNOSIS — R519 Headache, unspecified: Secondary | ICD-10-CM

## 2012-12-04 DIAGNOSIS — G43109 Migraine with aura, not intractable, without status migrainosus: Secondary | ICD-10-CM

## 2012-12-04 MED ORDER — ELETRIPTAN HYDROBROMIDE 20 MG PO TABS: 20.0000 mg | ORAL_TABLET | ORAL | Status: DC | PRN

## 2012-12-04 MED ORDER — AMITRIPTYLINE HCL 25 MG PO TABS: 25.0000 mg | ORAL_TABLET | Freq: Every day | ORAL | Status: DC

## 2012-12-04 NOTE — Progress Notes (Signed)
[facility]  PATIENT: Christina [name] DOB: 03/17/1981  REFERRING CLINICIAN: ER HISTORY FROM: patient REASON FOR VISIT: headaches   HISTORICAL  CHIEF COMPLAINT:  Chief Complaint  Patient presents with  . Migraine    HISTORY OF PRESENT ILLNESS: 32 year old right-handed female here for evaluation of migraine headaches.  Patient reports history of migraine headaches since age 46 years old. She describes global headaches, sometimes left sided parietal throbbing, tingling headaches with nausea, vomiting, photophobia and phonophobia. Triggers include loud noise, laughing or sneezing. She typically has to headaches per month, but each can last several days a time. She averages 4-6 days of migraine per month. Patient has tried Maxalt tablets in the past which provided some relief, but made her very sleepy. She has not tried migraine prophylactic medications.  Other ongoing triggering factors include stress related to work, to parents were sick, raising 2 children, and attending school. Patient has family history of migraine in her father.  REVIEW OF SYSTEMS: Full 14 system review of systems performed and notable only for left ear muffledsound, blurred vision, headache, insomnia, dizziness, syncope x1, anxiety, disinterest in activities.  ALLERGIES: No Known Allergies  HOME MEDICATIONS: Outpatient Prescriptions Prior to Visit  Medication Sig Dispense Refill  . aspirin-acetaminophen-caffeine (EXCEDRIN MIGRAINE) 250-250-65 MG per tablet Take 1 tablet by mouth every 6 (six) hours as needed for pain.  30 tablet  0  . ibuprofen (ADVIL,MOTRIN) 200 MG tablet Take 200 mg by mouth every 6 (six) hours as needed for pain or headache.       No facility-administered medications prior to visit.    PAST MEDICAL HISTORY: Past Medical History  Diagnosis Date  . Migraine     PAST SURGICAL HISTORY: Past Surgical History  Procedure Laterality Date  . Cesarean section       FAMILY HISTORY: Family History  Problem Relation Age of Onset  . Hypertension Mother   . Stroke Mother   . Cancer Father   . Hypertension Father   . Migraines Father     SOCIAL HISTORY: History   Social History  . Marital Status: Single    Spouse Name: N/A    Number of Children: N/A  . Years of Education: N/A   Occupational History  . Not on file.   Social History Main Topics  . Smoking status: Never Smoker   . Smokeless tobacco: Not on file  . Alcohol Use: Yes  . Drug Use: No  . Sexually Active: Yes    Birth Control/ Protection: None   Other Topics Concern  . Not on file   Social History Narrative   Pt lives at home with her two children. She is single, has some college level education, and will be starting a new job on 12/05/12. She does drink caffeine.     PHYSICAL EXAM  Filed Vitals:   12/04/12 1142  BP: 85/55  Pulse: 69  Temp: 98.3 F (36.8 C)  TempSrc: Oral  Height: 5\' 3"  (1.6 m)  Weight: 118 lb (53.524 kg)   GENERAL EXAM: Patient is in no distress; wax in bilateral external auditory canals  CARDIOVASCULAR: Regular rate and rhythm, no murmurs, no carotid bruits  NEUROLOGIC: MENTAL STATUS: awake, alert, language fluent, comprehension intact, naming intact CRANIAL NERVE: no papilledema on fundoscopic exam, pupils equal and reactive to light, visual fields full to confrontation, extraocular muscles intact, no nystagmus, facial sensation and strength symmetric, uvula midline, shoulder shrug symmetric, tongue midline. Colored contact lenses. MOTOR: normal bulk and tone, full  strength in the BUE, BLE SENSORY: normal and symmetric to light touch, pinprick, temperature, vibration COORDINATION: finger-nose-finger, fine finger movements normal REFLEXES: deep tendon reflexes present and symmetric GAIT/STATION: narrow based gait; able to walk on toes, heels and tandem; romberg is negative  DIAGNOSTIC DATA (LABS, IMAGING, TESTING) - I reviewed patient  records, labs, notes, testing and imaging myself where available.  Lab Results  Component Value Date   WBC 5.2 11/21/2012   HGB 12.6 11/21/2012   HCT 37.4 11/21/2012   MCV 90.8 11/21/2012   PLT 201 11/21/2012      Component Value Date/Time   NA 136 11/21/2012 0735   K 3.3* 11/21/2012 0735   CL 101 11/21/2012 0735   CO2 27 11/21/2012 0735   GLUCOSE 113* 11/21/2012 0735   BUN 8 11/21/2012 0735   CREATININE 0.72 11/21/2012 0735   CALCIUM 8.5 11/21/2012 0735   GFRNONAA >90 11/21/2012 0735   GFRAA >90 11/21/2012 0735   10/19/07 CT head - normal  ASSESSMENT AND PLAN  32 year old female with migraine headaches since age 16 years old. Current headaches are most likely continuation of her migraines. However current headaches are also more severe than previously. We'll check MRI of the brain to rule out secondary causes. Will also start patient on amitriptyline and Relpax for headache management. Followup in 3 months.   Meds ordered this encounter  Medications  . amitriptyline (ELAVIL) 25 MG tablet    Sig: Take 1 tablet (25 mg total) by mouth at bedtime.    Dispense:  90 tablet    Refill:  3  . eletriptan (RELPAX) 20 MG tablet    Sig: One tablet by mouth at onset of headache. May repeat in 2 hours if headache persists or recurs.    Dispense:  24 tablet    Refill:  3    Orders Placed This Encounter  Procedures  . MR Brain Wo Contrast    Suanne Marker, MD 12/04/2012, 12:12 PM Certified in Neurology, Neurophysiology and Neuroimaging  [facility] [address] [place], [place] [address] [phone]

## 2012-12-04 NOTE — Patient Instructions (Signed)
Migraine Headache A migraine headache is an intense, throbbing pain on one or both sides of your head. A migraine can last for 30 minutes to several hours. CAUSES  The exact cause of a migraine headache is not always known. However, a migraine may be caused when nerves in the brain become irritated and release chemicals that cause inflammation. This causes pain. SYMPTOMS  Pain on one or both sides of your head.  Pulsating or throbbing pain.  Severe pain that prevents daily activities.  Pain that is aggravated by any physical activity.  Nausea, vomiting, or both.  Dizziness.  Pain with exposure to bright lights, loud noises, or activity.  General sensitivity to bright lights, loud noises, or smells. Before you get a migraine, you may get warning signs that a migraine is coming (aura). An aura may include:  Seeing flashing lights.  Seeing bright spots, halos, or zig-zag lines.  Having tunnel vision or blurred vision.  Having feelings of numbness or tingling.  Having trouble talking.  Having muscle weakness. MIGRAINE TRIGGERS  Alcohol.  Smoking.  Stress.  Menstruation.  Aged cheeses.  Foods or drinks that contain nitrates, glutamate, aspartame, or tyramine.  Lack of sleep.  Chocolate.  Caffeine.  Hunger.  Physical exertion.  Fatigue.  Medicines used to treat chest pain (nitroglycerine), birth control pills, estrogen, and some blood pressure medicines. DIAGNOSIS  A migraine headache is often diagnosed based on:  Symptoms.  Physical examination.  A CT scan or MRI of your head. TREATMENT Medicines may be given for pain and nausea. Medicines can also be given to help prevent recurrent migraines.  HOME CARE INSTRUCTIONS  Only take over-the-counter or prescription medicines for pain or discomfort as directed by your caregiver. The use of long-term narcotics is not recommended.  Lie down in a dark, quiet room when you have a migraine.  Keep a journal  to find out what may trigger your migraine headaches. For example, write down:  What you eat and drink.  How much sleep you get.  Any change to your diet or medicines.  Limit alcohol consumption.  Quit smoking if you smoke.  Get 7 to 9 hours of sleep, or as recommended by your caregiver.  Limit stress.  Keep lights dim if bright lights bother you and make your migraines worse. SEEK IMMEDIATE MEDICAL CARE IF:   Your migraine becomes severe.  You have a fever.  You have a stiff neck.  You have vision loss.  You have muscular weakness or loss of muscle control.  You start losing your balance or have trouble walking.  You feel faint or pass out.  You have severe symptoms that are different from your first symptoms. MAKE SURE YOU:   Understand these instructions.  Will watch your condition.  Will get help right away if you are not doing well or get worse. Document Released: 09/04/2005 Document Revised: 11/27/2011 Document Reviewed: 08/25/2011 [employer] Patient Information 2013 [employer].  

## 2012-12-06 ENCOUNTER — Ambulatory Visit: Payer: Self-pay | Admitting: Diagnostic Neuroimaging

## 2012-12-11 ENCOUNTER — Other Ambulatory Visit: Payer: Managed Care, Other (non HMO)

## 2013-01-12 ENCOUNTER — Inpatient Hospital Stay (HOSPITAL_COMMUNITY)
Admission: AD | Admit: 2013-01-12 | Discharge: 2013-01-12 | Disposition: A | Discharge status: Hospice | Payer: Managed Care, Other (non HMO) | Source: Ambulatory Visit | Attending: Obstetrics & Gynecology | Admitting: Obstetrics & Gynecology

## 2013-01-12 ENCOUNTER — Encounter (HOSPITAL_COMMUNITY): Payer: Self-pay | Admitting: *Deleted

## 2013-01-12 DIAGNOSIS — R109 Unspecified abdominal pain: Secondary | ICD-10-CM | POA: Insufficient documentation

## 2013-01-12 DIAGNOSIS — B9689 Other specified bacterial agents as the cause of diseases classified elsewhere: Secondary | ICD-10-CM | POA: Insufficient documentation

## 2013-01-12 DIAGNOSIS — A499 Bacterial infection, unspecified: Secondary | ICD-10-CM

## 2013-01-12 DIAGNOSIS — N949 Unspecified condition associated with female genital organs and menstrual cycle: Secondary | ICD-10-CM | POA: Insufficient documentation

## 2013-01-12 DIAGNOSIS — N760 Acute vaginitis: Secondary | ICD-10-CM | POA: Insufficient documentation

## 2013-01-12 HISTORY — DX: Other specified bacterial agents as the cause of diseases classified elsewhere: N76.0

## 2013-01-12 HISTORY — DX: Unspecified abnormal cytological findings in specimens from cervix uteri: R87.619

## 2013-01-12 HISTORY — DX: Acute vaginitis: B96.89

## 2013-01-12 HISTORY — DX: Reserved for concepts with insufficient information to code with codable children: IMO0002

## 2013-01-12 LAB — POCT PREGNANCY, URINE: Preg Test, Ur: NEGATIVE

## 2013-01-12 LAB — WET PREP, GENITAL: Yeast Wet Prep HPF POC: NONE SEEN

## 2013-01-12 LAB — URINALYSIS, ROUTINE W REFLEX MICROSCOPIC
Ketones, ur: NEGATIVE mg/dL
Nitrite: NEGATIVE
Specific Gravity, Urine: <1.005 — ABNORMAL LOW (ref 1.005–1.030)
pH: 6 (ref 5.0–8.0)

## 2013-01-12 LAB — URINE MICROSCOPIC-ADD ON

## 2013-01-12 MED ORDER — METRONIDAZOLE 500 MG PO TABS: 500.0000 mg | ORAL_TABLET | Freq: Two times a day (BID) | ORAL | Status: DC

## 2013-01-12 MED ORDER — CLINDAMYCIN HCL 300 MG PO CAPS: 300.0000 mg | ORAL_CAPSULE | Freq: Two times a day (BID) | ORAL | Status: DC

## 2013-01-12 NOTE — MAU Note (Signed)
Pt c/o lower abd cramping for the past two days which 800mg  ibuprophen helped; but pt more concerned about a yellowish clumpy discharge with a fishy odor that started this morning.

## 2013-01-12 NOTE — MAU Provider Note (Signed)
History     CSN: [id number]  Arrival date and time: 01/12/13 1426   None     Chief Complaint  Patient presents with  . Vaginal Discharge  . Abdominal Pain   HPI 32 y.o. [id number] with vaginal discharge, irritation and itching x 2 days, some mild low abd cramping as well which resolves with motrin.   Past Medical History  Diagnosis Date  . Migraine     Past Surgical History  Procedure Laterality Date  . Cesarean section      Family History  Problem Relation Age of Onset  . Hypertension Mother   . Stroke Mother   . Cancer Father   . Hypertension Father   . Migraines Father     History  Substance Use Topics  . Smoking status: Never Smoker   . Smokeless tobacco: Not on file  . Alcohol Use: Yes    Allergies: No Known Allergies  Prescriptions prior to admission  Medication Sig Dispense Refill  . amitriptyline (ELAVIL) 25 MG tablet Take 1 tablet (25 mg total) by mouth at bedtime.  90 tablet  3  . aspirin-acetaminophen-caffeine (EXCEDRIN MIGRAINE) 250-250-65 MG per tablet Take 1 tablet by mouth every 6 (six) hours as needed for pain.  30 tablet  0  . eletriptan (RELPAX) 20 MG tablet One tablet by mouth at onset of headache. May repeat in 2 hours if headache persists or recurs.  24 tablet  3  . ibuprofen (ADVIL,MOTRIN) 200 MG tablet Take 200 mg by mouth every 6 (six) hours as needed for pain or headache.        Review of Systems  Constitutional: Negative.   Respiratory: Negative.   Cardiovascular: Negative.   Gastrointestinal: Negative for nausea, vomiting, abdominal pain, diarrhea and constipation.  Genitourinary: Negative for dysuria, urgency, frequency, hematuria and flank pain.       Negative for vaginal bleeding, + discharge   Musculoskeletal: Negative.   Neurological: Negative.   Psychiatric/Behavioral: Negative.    Physical Exam   Blood pressure 135/73, pulse 119, temperature 99.2 F (37.3 C), resp. rate 18, height 5\' 1"  (1.549 m), weight 116 lb 9.6 oz  (52.889 kg), last menstrual period 01/06/2013.  Physical Exam  Nursing note and vitals reviewed. Constitutional: She is oriented to person, place, and time. She appears well-developed and well-nourished. No distress.  Cardiovascular: Normal rate.   Respiratory: Effort normal.  GI: Soft. She exhibits no distension.  Genitourinary: There is no rash, tenderness or lesion on the right labia. There is no rash, tenderness or lesion on the left labia. Uterus is not enlarged and not tender. Cervix exhibits no motion tenderness, no discharge and no friability. Right adnexum displays no mass, no tenderness and no fullness. Left adnexum displays no mass, no tenderness and no fullness. No erythema or bleeding around the vagina. Vaginal discharge (thin, yellowish, slightly malodorous) found.  Musculoskeletal: Normal range of motion.  Neurological: She is alert and oriented to person, place, and time.  Skin: Skin is warm.  Psychiatric: She has a normal mood and affect.    MAU Course  Procedures Results for orders placed during the hospital encounter of 01/12/13 (from the past 24 hour(s))  URINALYSIS, ROUTINE W REFLEX MICROSCOPIC     Status: Abnormal   Collection Time    01/12/13  2:44 PM      Result Value Range   Color, Urine YELLOW  YELLOW   APPearance CLEAR  CLEAR   Specific Gravity, Urine <1.005 (*) 1.005 - 1.030  pH 6.0  5.0 - 8.0   Glucose, UA NEGATIVE  NEGATIVE mg/dL   Hgb urine dipstick TRACE (*) NEGATIVE   Bilirubin Urine NEGATIVE  NEGATIVE   Ketones, ur NEGATIVE  NEGATIVE mg/dL   Protein, ur NEGATIVE  NEGATIVE mg/dL   Urobilinogen, UA 0.2  0.0 - 1.0 mg/dL   Nitrite NEGATIVE  NEGATIVE   Leukocytes, UA SMALL (*) NEGATIVE  URINE MICROSCOPIC-ADD ON     Status: Abnormal   Collection Time    01/12/13  2:44 PM      Result Value Range   Squamous Epithelial / LPF MANY (*) RARE   WBC, UA 3-6  <3 WBC/hpf   RBC / HPF 0-2  <3 RBC/hpf  POCT PREGNANCY, URINE     Status: None   Collection Time     01/12/13  2:53 PM      Result Value Range   Preg Test, Ur NEGATIVE  NEGATIVE  WET PREP, GENITAL     Status: Abnormal   Collection Time    01/12/13  3:30 PM      Result Value Range   Yeast Wet Prep HPF POC NONE SEEN  NONE SEEN   Trich, Wet Prep NONE SEEN  NONE SEEN   Clue Cells Wet Prep HPF POC MODERATE (*) NONE SEEN   WBC, Wet Prep HPF POC MODERATE (*) NONE SEEN     Assessment and Plan   1. BV (bacterial vaginosis)       Medication List    TAKE these medications       amitriptyline 25 MG tablet  Commonly known as:  ELAVIL  Take 1 tablet (25 mg total) by mouth at bedtime.     aspirin-acetaminophen-caffeine 250-250-65 MG per tablet  Commonly known as:  EXCEDRIN MIGRAINE  Take 1 tablet by mouth every 6 (six) hours as needed for pain.     clindamycin 300 MG capsule  Commonly known as:  CLEOCIN  Take 1 capsule (300 mg total) by mouth 2 (two) times daily.     eletriptan 20 MG tablet  Commonly known as:  RELPAX  One tablet by mouth at onset of headache. May repeat in 2 hours if headache persists or recurs.     ibuprofen 200 MG tablet  Commonly known as:  ADVIL,MOTRIN  Take 200 mg by mouth every 6 (six) hours as needed for pain or headache.            Follow-up Information   Follow up with [facility]. (As needed)    Contact information:   [address] [place] [address] [phone]        [facility] 01/12/2013, 3:19 PM

## 2013-01-12 NOTE — MAU Note (Signed)
Pt reports haivng abd pain and vaginal discharge that is yellow and thick/clumpy

## 2013-03-08 ENCOUNTER — Encounter (HOSPITAL_COMMUNITY): Payer: Self-pay | Admitting: *Deleted

## 2013-03-08 ENCOUNTER — Inpatient Hospital Stay (HOSPITAL_COMMUNITY)
Admission: AD | Admit: 2013-03-08 | Discharge: 2013-03-08 | Disposition: A | Discharge status: Home / Self Care | Payer: Self-pay | Source: Ambulatory Visit | Attending: Obstetrics & Gynecology | Admitting: Obstetrics & Gynecology

## 2013-03-08 DIAGNOSIS — N760 Acute vaginitis: Secondary | ICD-10-CM | POA: Insufficient documentation

## 2013-03-08 DIAGNOSIS — B9689 Other specified bacterial agents as the cause of diseases classified elsewhere: Secondary | ICD-10-CM

## 2013-03-08 DIAGNOSIS — Z3201 Encounter for pregnancy test, result positive: Secondary | ICD-10-CM

## 2013-03-08 DIAGNOSIS — A499 Bacterial infection, unspecified: Secondary | ICD-10-CM | POA: Insufficient documentation

## 2013-03-08 LAB — WET PREP, GENITAL: Trich, Wet Prep: NONE SEEN

## 2013-03-08 LAB — URINALYSIS, ROUTINE W REFLEX MICROSCOPIC
Bilirubin Urine: NEGATIVE
Glucose, UA: NEGATIVE mg/dL
Hgb urine dipstick: NEGATIVE
Specific Gravity, Urine: 1.015 (ref 1.005–1.030)
pH: 6.5 (ref 5.0–8.0)

## 2013-03-08 MED ORDER — FLUCONAZOLE 150 MG PO TABS: 150.0000 mg | ORAL_TABLET | Freq: Once | ORAL | Status: DC

## 2013-03-08 MED ORDER — CLINDAMYCIN HCL 300 MG PO CAPS: 300.0000 mg | ORAL_CAPSULE | Freq: Two times a day (BID) | ORAL | Status: DC

## 2013-03-08 NOTE — MAU Note (Signed)
Pt presents with complaints of her last menstrual being May 20, states some vaginal discharge with some odor and has a history of BV.

## 2013-03-08 NOTE — MAU Provider Note (Signed)
History     CSN: [id number]  Arrival date and time: 03/08/13 1614   First Provider Initiated Contact with Patient 03/08/13 1648      Chief Complaint  Patient presents with  . Possible Pregnancy   HPI  Christina [name]  is a 32 y.o. G3P2002 at [redacted]w[redacted]d weeks presenting with possible pregnancy. Patient's last menstrual period was 02/04/2013. No pain or bleeding, + malodorous discharge.     Past Medical History  Diagnosis Date  . Migraine   . BV (bacterial vaginosis)   . Abnormal Pap smear     Past Surgical History  Procedure Laterality Date  . Cesarean section    . Dental surgery      Family History  Problem Relation Age of Onset  . Hypertension Mother   . Stroke Mother   . Cancer Father   . Hypertension Father   . Migraines Father     History  Substance Use Topics  . Smoking status: Never Smoker   . Smokeless tobacco: Not on file  . Alcohol Use: Yes    Allergies: No Known Allergies  Prescriptions prior to admission  Medication Sig Dispense Refill  . amitriptyline (ELAVIL) 25 MG tablet Take 1 tablet (25 mg total) by mouth at bedtime.  90 tablet  3    Review of Systems  Constitutional: Negative.   Respiratory: Negative.   Cardiovascular: Negative.   Gastrointestinal: Negative for nausea, vomiting, abdominal pain, diarrhea and constipation.  Genitourinary: Negative for dysuria, urgency, frequency, hematuria and flank pain.       Negative for vaginal bleeding, + vaginal discharge  Musculoskeletal: Negative.   Neurological: Negative.   Psychiatric/Behavioral: Negative.    Physical Exam   Blood pressure 109/67, pulse 106, temperature 98.5 F (36.9 C), temperature source Oral, resp. rate 18, height 5\' 1"  (1.549 m), weight 115 lb (52.164 kg), last menstrual period 02/04/2013.  Physical Exam  Vitals reviewed. Constitutional: She is oriented to person, place, and time. She appears well-developed and well-nourished. No distress.  HENT:  Head: Normocephalic  and atraumatic.  Cardiovascular: Normal rate, regular rhythm and normal heart sounds.   Respiratory: Effort normal and breath sounds normal. No respiratory distress.  GI: Soft. Bowel sounds are normal. She exhibits no distension and no mass. There is no tenderness. There is no rebound and no guarding.  Genitourinary: There is no rash or lesion on the right labia. There is no rash or lesion on the left labia. Uterus is not deviated, not enlarged, not fixed and not tender. Cervix exhibits no motion tenderness, no discharge and no friability. Right adnexum displays no mass, no tenderness and no fullness. Left adnexum displays no mass, no tenderness and no fullness. No erythema, tenderness or bleeding around the vagina. Vaginal discharge (white, malodorous) found.  Neurological: She is alert and oriented to person, place, and time.  Skin: Skin is warm and dry.  Psychiatric: She has a normal mood and affect.    MAU Course  Procedures Results for orders placed during the hospital encounter of 03/08/13 (from the past 72 hour(s))  URINALYSIS, ROUTINE W REFLEX MICROSCOPIC     Status: None   Collection Time    03/08/13  4:20 PM      Result Value Range   Color, Urine YELLOW  YELLOW   APPearance CLEAR  CLEAR   Specific Gravity, Urine 1.015  1.005 - 1.030   pH 6.5  5.0 - 8.0   Glucose, UA NEGATIVE  NEGATIVE mg/dL   Hgb urine  dipstick NEGATIVE  NEGATIVE   Bilirubin Urine NEGATIVE  NEGATIVE   Ketones, ur NEGATIVE  NEGATIVE mg/dL   Protein, ur NEGATIVE  NEGATIVE mg/dL   Urobilinogen, UA 0.2  0.0 - 1.0 mg/dL   Nitrite NEGATIVE  NEGATIVE   Leukocytes, UA NEGATIVE  NEGATIVE   Comment: MICROSCOPIC NOT DONE ON URINES WITH NEGATIVE PROTEIN, BLOOD, LEUKOCYTES, NITRITE, OR GLUCOSE <1000 mg/dL.  POCT PREGNANCY, URINE     Status: Abnormal   Collection Time    03/08/13  4:32 PM      Result Value Range   Preg Test, Ur POSITIVE (*) NEGATIVE   Comment:            THE SENSITIVITY OF THIS     METHODOLOGY IS >24  mIU/mL  WET PREP, GENITAL     Status: Abnormal   Collection Time    03/08/13  4:50 PM      Result Value Range   Yeast Wet Prep HPF POC NONE SEEN  NONE SEEN   Trich, Wet Prep NONE SEEN  NONE SEEN   Clue Cells Wet Prep HPF POC MANY (*) NONE SEEN   WBC, Wet Prep HPF POC FEW (*) NONE SEEN   Comment: MODERATE BACTERIA SEEN    Assessment and Plan   1. BV (bacterial vaginosis)   2. Positive pregnancy test   Rx clindamycin and diflucan as below (pt states that flagyl doesn't work for her and she gets a yeast infection when she takes clinda) F/U with Dr. Gaynell Face for prenatal care    Medication List    TAKE these medications       amitriptyline 25 MG tablet  Commonly known as:  ELAVIL  Take 1 tablet (25 mg total) by mouth at bedtime.     clindamycin 300 MG capsule  Commonly known as:  CLEOCIN  Take 1 capsule (300 mg total) by mouth 2 (two) times daily.     fluconazole 150 MG tablet  Commonly known as:  DIFLUCAN  Take 1 tablet (150 mg total) by mouth once.            Follow-up Information   Schedule an appointment as soon as possible for a visit with Kathreen Cosier, MD.   Contact information:   [address] [place] [address] [phone]         [facility] 03/08/2013, 5:06 PM

## 2013-03-11 LAB — GC/CHLAMYDIA PROBE AMP
CT Probe RNA: POSITIVE — AB
GC Probe RNA: NEGATIVE

## 2013-03-12 ENCOUNTER — Encounter (HOSPITAL_COMMUNITY): Payer: Self-pay | Admitting: *Deleted

## 2013-03-12 ENCOUNTER — Inpatient Hospital Stay (HOSPITAL_COMMUNITY): Payer: Medicaid Other

## 2013-03-12 ENCOUNTER — Inpatient Hospital Stay (HOSPITAL_COMMUNITY)
Admission: AD | Admit: 2013-03-12 | Discharge: 2013-03-12 | Disposition: A | Discharge status: Home / Self Care | Payer: Medicaid Other | Source: Ambulatory Visit | Attending: Obstetrics | Admitting: Obstetrics

## 2013-03-12 DIAGNOSIS — A5619 Other chlamydial genitourinary infection: Secondary | ICD-10-CM | POA: Insufficient documentation

## 2013-03-12 DIAGNOSIS — R109 Unspecified abdominal pain: Secondary | ICD-10-CM | POA: Insufficient documentation

## 2013-03-12 DIAGNOSIS — O98319 Other infections with a predominantly sexual mode of transmission complicating pregnancy, unspecified trimester: Secondary | ICD-10-CM | POA: Insufficient documentation

## 2013-03-12 DIAGNOSIS — Z3201 Encounter for pregnancy test, result positive: Secondary | ICD-10-CM

## 2013-03-12 DIAGNOSIS — N739 Female pelvic inflammatory disease, unspecified: Secondary | ICD-10-CM | POA: Insufficient documentation

## 2013-03-12 DIAGNOSIS — A749 Chlamydial infection, unspecified: Secondary | ICD-10-CM

## 2013-03-12 LAB — URINALYSIS, ROUTINE W REFLEX MICROSCOPIC
Bilirubin Urine: NEGATIVE
Hgb urine dipstick: NEGATIVE
Protein, ur: NEGATIVE mg/dL
Urobilinogen, UA: 0.2 mg/dL (ref 0.0–1.0)

## 2013-03-12 LAB — CBC
HCT: 37.2 % (ref 36.0–46.0)
MCH: 30.1 pg (ref 26.0–34.0)
MCV: 89.6 fL (ref 78.0–100.0)
Platelets: 197 10*3/uL (ref 150–400)
RDW: 12.4 % (ref 11.5–15.5)

## 2013-03-12 MED ORDER — AZITHROMYCIN 250 MG PO TABS
1000.0000 mg | ORAL_TABLET | ORAL | Status: AC
  Administered 2013-03-12: 1000 mg via ORAL
  Filled 2013-03-12: qty 4

## 2013-03-12 NOTE — MAU Note (Signed)
Patient states she has been having abdominal pain on the right lower abdomen since 6-22. Denies bleeding or discharge. Has nausea, no vomiting.

## 2013-03-12 NOTE — MAU Provider Note (Signed)
History     CSN: [id number]  Arrival date and time: 03/12/13 4098   None     Chief Complaint  Patient presents with  . Abdominal Pain   HPI Patient seen in MAU on 6/21 and treated for (+)BV wet prep with Clindamycin and fluconazole (patient states Metronidazole typically does not work for her and that clindamycin has given her yeast infections in the past). Of note, her chlamydia screen was also (+) and patient was contacted by [facility] but has not started treatment. She presents today for right lower abdominal pain which began on Sunday. It was initially well controlled with Tylenol but today it has increased to 8/10. She endorses nausea due to the pain but denies vomiting, diarrhea, fevers, chills, or urinary symptoms. She is currently pregnant at [redacted]w[redacted]d, LMP 5/20. She denies vaginal discharge, bleeding, cramping or contractions.  OB History   Grav Para Term Preterm Abortions TAB SAB Ect Mult Living   3 2 2       2       Past Medical History  Diagnosis Date  . Migraine   . BV (bacterial vaginosis)   . Abnormal Pap smear     Past Surgical History  Procedure Laterality Date  . Cesarean section    . Dental surgery      Family History  Problem Relation Age of Onset  . Hypertension Mother   . Stroke Mother   . Cancer Father   . Hypertension Father   . Migraines Father     History  Substance Use Topics  . Smoking status: Never Smoker   . Smokeless tobacco: Not on file  . Alcohol Use: Yes    Allergies: No Known Allergies  Prescriptions prior to admission  Medication Sig Dispense Refill  . clindamycin (CLEOCIN) 300 MG capsule Take 1 capsule (300 mg total) by mouth 2 (two) times daily.  14 capsule  0  . fluconazole (DIFLUCAN) 150 MG tablet Take 1 tablet (150 mg total) by mouth once.  1 tablet  0    Review of Systems  Constitutional: Negative for fever and chills.  Eyes: Negative for blurred vision.  Respiratory: Negative for cough.   Cardiovascular:  Negative for chest pain and palpitations.  Gastrointestinal: Positive for nausea and abdominal pain. Negative for heartburn, vomiting, diarrhea, constipation and blood in stool.  Genitourinary: Negative for dysuria, urgency, frequency, hematuria and flank pain.  Musculoskeletal: Negative for myalgias.  Skin: Negative for itching and rash.  Neurological: Negative for dizziness and headaches.   Physical Exam   Blood pressure 101/58, pulse 108, temperature 98.8 F (37.1 C), temperature source Oral, resp. rate 16, height 5' 1.5" (1.562 m), weight 53.252 kg (117 lb 6.4 oz), last menstrual period 02/04/2013, SpO2 100.00%.  Physical Exam  Constitutional: She is oriented to person, place, and time. She appears well-developed and well-nourished.  HENT:  Head: Normocephalic and atraumatic.  Neck: Normal range of motion.  Cardiovascular: Normal rate, regular rhythm, normal heart sounds and intact distal pulses.  Exam reveals no gallop and no friction rub.   No murmur heard. Respiratory: Effort normal and breath sounds normal.  GI: Soft. Bowel sounds are normal. She exhibits no distension and no mass. There is tenderness (Suprapubic and RLQ). There is no rebound and no guarding.  Musculoskeletal: Normal range of motion.  Neurological: She is alert and oriented to person, place, and time.  Skin: Skin is warm and dry.  Psychiatric: She has a normal mood and affect. Her behavior is  normal.    MAU Course  Procedures None  MDM U/S Pending Quant hCG: Previous Chlamydia screen positive, has not received treatment Assessment and Plan    Arther Abbott 03/12/2013, 9:29 AM    A: 1. Positive pregnancy test   2. Chlamydia infection complicating pregnancy, first trimester     P: Azithromycin 1000 mg PO x1 dose given in MAU D/C home Return to MAU in 48 hours for repeat quant hcg Return to MAU sooner as needed  Sharen Counter Certified Nurse-Midwife

## 2013-03-14 ENCOUNTER — Inpatient Hospital Stay (HOSPITAL_COMMUNITY)
Admission: AD | Admit: 2013-03-14 | Discharge: 2013-03-14 | Disposition: A | Discharge status: Home / Self Care | Payer: Medicaid Other | Source: Ambulatory Visit | Attending: Obstetrics & Gynecology | Admitting: Obstetrics & Gynecology

## 2013-03-14 ENCOUNTER — Encounter (HOSPITAL_COMMUNITY): Payer: Self-pay | Admitting: *Deleted

## 2013-03-14 DIAGNOSIS — O039 Complete or unspecified spontaneous abortion without complication: Secondary | ICD-10-CM | POA: Insufficient documentation

## 2013-03-14 LAB — URINALYSIS, ROUTINE W REFLEX MICROSCOPIC
Bilirubin Urine: NEGATIVE
Glucose, UA: NEGATIVE mg/dL
Protein, ur: 30 mg/dL — AB
Specific Gravity, Urine: >1.03 — ABNORMAL HIGH (ref 1.005–1.030)
Urobilinogen, UA: 0.2 mg/dL (ref 0.0–1.0)

## 2013-03-14 LAB — URINE MICROSCOPIC-ADD ON

## 2013-03-14 MED ORDER — OXYCODONE-ACETAMINOPHEN 5-325 MG PO TABS: 1.0000 | ORAL_TABLET | ORAL | Status: DC | PRN

## 2013-03-14 MED ORDER — KETOROLAC TROMETHAMINE 60 MG/2ML IM SOLN
60.0000 mg | Freq: Once | INTRAMUSCULAR | Status: AC
  Administered 2013-03-14: 60 mg via INTRAMUSCULAR
  Filled 2013-03-14: qty 2

## 2013-03-14 MED ORDER — IBUPROFEN 800 MG PO TABS: 800.0000 mg | ORAL_TABLET | Freq: Three times a day (TID) | ORAL | Status: DC | PRN

## 2013-03-14 NOTE — MAU Provider Note (Signed)
History     CSN: [id number]  Arrival date and time: 03/14/13 5284   None     Chief Complaint  Patient presents with  . Vaginal Bleeding   HPI 32 y.o.  [id number] at [redacted]w[redacted]d here for repeat quant HCG. Reports increased cramping and bleeding this morning around 2 AM. States bleeding is not heavy now. Seen on 6/25 with RLQ pain, quant 42, 4.6 week size IUGS on u/s at that time. Was also treated for + chlamydia at that visit. Was previously seen in MAU on 6/21 and treated for BV and yeast.   Past Medical History  Diagnosis Date  . Migraine   . BV (bacterial vaginosis)   . Abnormal Pap smear     Past Surgical History  Procedure Laterality Date  . Cesarean section    . Dental surgery      Family History  Problem Relation Age of Onset  . Hypertension Mother   . Stroke Mother   . Cancer Father   . Hypertension Father   . Migraines Father     History  Substance Use Topics  . Smoking status: Never Smoker   . Smokeless tobacco: Not on file  . Alcohol Use: Yes    Allergies: No Known Allergies  Prescriptions prior to admission  Medication Sig Dispense Refill  . clindamycin (CLEOCIN) 300 MG capsule Take 1 capsule (300 mg total) by mouth 2 (two) times daily.  14 capsule  0  . fluconazole (DIFLUCAN) 150 MG tablet Take 1 tablet (150 mg total) by mouth once.  1 tablet  0    Review of Systems  Constitutional: Negative.   Respiratory: Negative.   Cardiovascular: Negative.   Gastrointestinal: Positive for abdominal pain. Negative for nausea, vomiting, diarrhea and constipation.  Genitourinary: Negative for dysuria, urgency, frequency, hematuria and flank pain.       + vaginal bleeding  Musculoskeletal: Negative.   Neurological: Negative.   Psychiatric/Behavioral: Negative.    Physical Exam   Blood pressure 103/61, pulse 100, temperature 98.3 F (36.8 C), temperature source Oral, resp. rate 18, height 5' 1.5" (1.562 m), weight 117 lb (53.071 kg), last menstrual period  02/04/2013.  Physical Exam  Nursing note and vitals reviewed. Constitutional: She appears well-developed and well-nourished. No distress.  Cardiovascular: Normal rate.   Respiratory: Effort normal.  GI: Soft. There is no tenderness.  Musculoskeletal: Normal range of motion.  Neurological: She is alert.  Skin: Skin is warm and dry.  Psychiatric: She has a normal mood and affect.    MAU Course  Procedures Results for orders placed during the hospital encounter of 03/14/13 (from the past 24 hour(s))  URINALYSIS, ROUTINE W REFLEX MICROSCOPIC     Status: Abnormal   Collection Time    03/14/13  7:30 AM      Result Value Range   Color, Urine YELLOW  YELLOW   APPearance CLOUDY (*) CLEAR   Specific Gravity, Urine >1.030 (*) 1.005 - 1.030   pH 5.5  5.0 - 8.0   Glucose, UA NEGATIVE  NEGATIVE mg/dL   Hgb urine dipstick LARGE (*) NEGATIVE   Bilirubin Urine NEGATIVE  NEGATIVE   Ketones, ur NEGATIVE  NEGATIVE mg/dL   Protein, ur 30 (*) NEGATIVE mg/dL   Urobilinogen, UA 0.2  0.0 - 1.0 mg/dL   Nitrite NEGATIVE  NEGATIVE   Leukocytes, UA TRACE (*) NEGATIVE  URINE MICROSCOPIC-ADD ON     Status: Abnormal   Collection Time    03/14/13  7:30 AM  Result Value Range   Squamous Epithelial / LPF MANY (*) RARE   WBC, UA 7-10  <3 WBC/hpf   RBC / HPF 7-10  <3 RBC/hpf   Bacteria, UA FEW (*) RARE  POCT PREGNANCY, URINE     Status: None   Collection Time    03/14/13  7:55 AM      Result Value Range   Preg Test, Ur NEGATIVE  NEGATIVE   Toradol 60 mg IM  Assessment and Plan   1. SAB (spontaneous abortion)   UPT negative today. Motrin and Percocet rx for pain at home. Precautions rev'd. Follow up in clinic in 2 weeks.     Medication List    TAKE these medications       clindamycin 300 MG capsule  Commonly known as:  CLEOCIN  Take 1 capsule (300 mg total) by mouth 2 (two) times daily.     fluconazole 150 MG tablet  Commonly known as:  DIFLUCAN  Take 1 tablet (150 mg total) by mouth  once.     ibuprofen 800 MG tablet  Commonly known as:  ADVIL,MOTRIN  Take 1 tablet (800 mg total) by mouth every 8 (eight) hours as needed for pain.     oxyCODONE-acetaminophen 5-325 MG per tablet  Commonly known as:  PERCOCET/ROXICET  Take 1 tablet by mouth every 4 (four) hours as needed for pain.            Follow-up Information   Follow up with [facility]. (someone will call to schedule)    Contact information:   [address] [place] [address] [phone]        [facility] 03/14/2013, 8:13 AM

## 2013-03-14 NOTE — MAU Note (Signed)
C/o bleeding and cramping since 0200 this AM;

## 2013-03-14 NOTE — MAU Provider Note (Signed)

## 2013-03-14 NOTE — MAU Note (Signed)
Was coming in today for follow-up Bhcg; started having cramping and more bleeding at 0200 this AM; Tearful and c/o pain;

## 2013-03-15 LAB — URINE CULTURE: Colony Count: NO GROWTH

## 2013-08-09 ENCOUNTER — Emergency Department (HOSPITAL_COMMUNITY)
Admission: EM | Admit: 2013-08-09 | Discharge: 2013-08-09 | Disposition: A | Discharge status: Home / Self Care | Payer: Medicaid Other | Attending: Emergency Medicine | Admitting: Emergency Medicine

## 2013-08-09 ENCOUNTER — Encounter (HOSPITAL_COMMUNITY): Payer: Self-pay | Admitting: Emergency Medicine

## 2013-08-09 DIAGNOSIS — G43909 Migraine, unspecified, not intractable, without status migrainosus: Secondary | ICD-10-CM | POA: Insufficient documentation

## 2013-08-09 DIAGNOSIS — Z8619 Personal history of other infectious and parasitic diseases: Secondary | ICD-10-CM | POA: Insufficient documentation

## 2013-08-09 DIAGNOSIS — Z8742 Personal history of other diseases of the female genital tract: Secondary | ICD-10-CM | POA: Insufficient documentation

## 2013-08-09 DIAGNOSIS — Z79899 Other long term (current) drug therapy: Secondary | ICD-10-CM | POA: Insufficient documentation

## 2013-08-09 MED ORDER — BUTALBITAL-APAP-CAFFEINE 50-325-40 MG PO TABS: 1.0000 | ORAL_TABLET | Freq: Four times a day (QID) | ORAL | Status: DC | PRN

## 2013-08-09 MED ORDER — METOCLOPRAMIDE HCL 5 MG/ML IJ SOLN
10.0000 mg | Freq: Once | INTRAMUSCULAR | Status: DC
  Filled 2013-08-09: qty 2

## 2013-08-09 MED ORDER — METOCLOPRAMIDE HCL 5 MG/ML IJ SOLN
10.0000 mg | Freq: Once | INTRAMUSCULAR | Status: AC
  Administered 2013-08-09: 10 mg via INTRAVENOUS

## 2013-08-09 MED ORDER — DIPHENHYDRAMINE HCL 50 MG/ML IJ SOLN
12.5000 mg | Freq: Once | INTRAMUSCULAR | Status: AC
  Administered 2013-08-09: 12.5 mg via INTRAVENOUS

## 2013-08-09 MED ORDER — KETOROLAC TROMETHAMINE 30 MG/ML IJ SOLN
30.0000 mg | Freq: Once | INTRAMUSCULAR | Status: AC
  Administered 2013-08-09: 30 mg via INTRAVENOUS

## 2013-08-09 MED ORDER — DIPHENHYDRAMINE HCL 50 MG/ML IJ SOLN
25.0000 mg | Freq: Once | INTRAMUSCULAR | Status: DC
  Filled 2013-08-09: qty 1

## 2013-08-09 MED ORDER — KETOROLAC TROMETHAMINE 30 MG/ML IJ SOLN
30.0000 mg | Freq: Once | INTRAMUSCULAR | Status: DC
  Filled 2013-08-09: qty 1

## 2013-08-09 MED ORDER — RIZATRIPTAN BENZOATE 5 MG PO TABS: 5.0000 mg | ORAL_TABLET | ORAL | Status: DC | PRN

## 2013-08-09 MED ORDER — SODIUM CHLORIDE 0.9 % IV BOLUS (SEPSIS)
1000.0000 mL | Freq: Once | INTRAVENOUS | Status: AC
  Administered 2013-08-09: 1000 mL via INTRAVENOUS

## 2013-08-09 NOTE — ED Notes (Signed)
Patient is alert and oriented x3.  She is complaining of a migraine that she had when she woke up this morning.  She states that she has had this issue with migraines before.  Currently she states her pain 8.5 of 10 with  Nausea and photosensitivity.

## 2013-08-09 NOTE — ED Provider Notes (Signed)
CSN: [id number]     Arrival date & time 08/09/13  0540 History   First MD Initiated Contact with Patient 08/09/13 [phone]     Chief Complaint  Patient presents with  . Migraine   (Consider location/radiation/quality/duration/timing/severity/associated sxs/prior Treatment) HPI  Christina [name] is a 32 y.o. female complaining of exacerbation of 8/10, throbbing,  right periorbital migraine associated with photosensitivity and nausea upon waking this morning. This is typical of her prior migraine exacerbations. Patient sees unknown neurologist at [facility] neurology. Patient takes amitriptyline nightly for prevention. Takes unknown abortive Rx witch she is out of. Patient denies fever, cervicalgia, vomiting, numbness, weakness, dysarthria, ataxia, confusion, disorientation.   Past Medical History  Diagnosis Date  . Migraine   . BV (bacterial vaginosis)   . Abnormal Pap smear    Past Surgical History  Procedure Laterality Date  . Cesarean section    . Dental surgery     Family History  Problem Relation Age of Onset  . Hypertension Mother   . Stroke Mother   . Cancer Father   . Hypertension Father   . Migraines Father    History  Substance Use Topics  . Smoking status: Never Smoker   . Smokeless tobacco: Not on file  . Alcohol Use: Yes   OB History   Grav Para Term Preterm Abortions TAB SAB Ect Mult Living   4 2 2  1  1   2      Review of Systems  10 systems reviewed and found to be negative, except as noted in the HPI   Allergies  Review of patient's allergies indicates no known allergies.  Home Medications   Current Outpatient Rx  Name  Route  Sig  Dispense  Refill  . amitriptyline (ELAVIL) 25 MG tablet   Oral   Take 25 mg by mouth at bedtime.         . butalbital-acetaminophen-caffeine (FIORICET) 50-325-40 MG per tablet   Oral   Take 1 tablet by mouth every 6 (six) hours as needed for headache.   15 tablet   0   . rizatriptan (MAXALT) 5 MG tablet   Oral  Take 1 tablet (5 mg total) by mouth as needed for migraine. May repeat in 2 hours if needed   10 tablet   0    BP 95/74  Pulse 111  Temp(Src) 98.1 F (36.7 C) (Oral)  Resp 14  SpO2 100%  LMP 02/04/2013  Breastfeeding? Unknown Physical Exam  Nursing note and vitals reviewed. Constitutional: She is oriented to person, place, and time. She appears well-developed and well-nourished. No distress.  HENT:  Head: Normocephalic.  Eyes: Conjunctivae and EOM are normal. Pupils are equal, round, and reactive to light.  Cardiovascular: Normal rate, regular rhythm and intact distal pulses.   Pulmonary/Chest: Effort normal and breath sounds normal. No stridor.  Abdominal: Soft. There is no tenderness.  Musculoskeletal: Normal range of motion.  Neurological: She is alert and oriented to person, place, and time.  Cranial nerves III through XII intact, strength 5 out of 5x4 extremities, negative pronator drift, finger to nose and heel-to-shin coordinated, sensation intact to pinprick and light touch, gait is coordinated and Romberg is negative.     Psychiatric: She has a normal mood and affect.    ED Course  Procedures (including critical care time) Labs Review Labs Reviewed - No data to display Imaging Review No results found.  EKG Interpretation   None      7:14 AM patient  seen and evaluated at the bedside, she is resting comfortably in her contacts in. Patient rates her pain at 3/10, state she is much improved   MDM   1. Migraine      Filed Vitals:   08/09/13 0547  BP: 95/74  Pulse: 111  Temp: 98.1 F (36.7 C)  TempSrc: Oral  Resp: 14  SpO2: 100%     Christina [name] is a 32 y.o. female Pt HA treated and improved while in ED.  Presentation is like pts typical HA and non concerning for [facility], ICH, Meningitis, or temporal arteritis. Pt is afebrile with no focal neuro deficits, nuchal rigidity, or change in vision.  Pt verbalizes understanding and is agreeable with plan to  dc.   Patient is requesting Maxalt for pain control.  Medications  ketorolac (TORADOL) 30 MG/ML injection 30 mg (30 mg Intravenous Given 08/09/13 0608)  metoCLOPramide (REGLAN) injection 10 mg (10 mg Intravenous Given 08/09/13 0608)  diphenhydrAMINE (BENADRYL) injection 12.5 mg (12.5 mg Intravenous Given 08/09/13 0607)  sodium chloride 0.9 % bolus 1,000 mL (1,000 mLs Intravenous New Bag/Given 08/09/13 0608)    Pt is hemodynamically stable, appropriate for, and amenable to discharge at this time. Pt verbalized understanding and agrees with care plan. All questions answered. Outpatient follow-up and specific return precautions discussed.    New Prescriptions   BUTALBITAL-ACETAMINOPHEN-CAFFEINE (FIORICET) 50-325-40 MG PER TABLET    Take 1 tablet by mouth every 6 (six) hours as needed for headache.   RIZATRIPTAN (MAXALT) 5 MG TABLET    Take 1 tablet (5 mg total) by mouth as needed for migraine. May repeat in 2 hours if needed    Note: Portions of this report may have been transcribed using voice recognition software. Every effort was made to ensure accuracy; however, inadvertent computerized transcription errors may be present      Wynetta Emery, PA-C 08/09/13 0715

## 2013-08-09 NOTE — ED Provider Notes (Signed)
Medical screening examination/treatment/procedure(s) were performed by non-physician practitioner and as supervising physician I was immediately available for consultation/collaboration.    Olivia Mackie, MD 08/09/13 [phone]

## 2013-10-04 ENCOUNTER — Emergency Department (HOSPITAL_COMMUNITY)
Admission: EM | Admit: 2013-10-04 | Discharge: 2013-10-04 | Disposition: A | Discharge status: Home / Self Care | Payer: Medicaid Other | Source: Home / Self Care | Attending: Emergency Medicine | Admitting: Emergency Medicine

## 2013-10-04 ENCOUNTER — Encounter (HOSPITAL_COMMUNITY): Payer: Self-pay | Admitting: Emergency Medicine

## 2013-10-04 DIAGNOSIS — H6090 Unspecified otitis externa, unspecified ear: Secondary | ICD-10-CM

## 2013-10-04 DIAGNOSIS — H60399 Other infective otitis externa, unspecified ear: Secondary | ICD-10-CM

## 2013-10-04 DIAGNOSIS — G43909 Migraine, unspecified, not intractable, without status migrainosus: Secondary | ICD-10-CM

## 2013-10-04 MED ORDER — NEOMYCIN-POLYMYXIN-HC 3.5-10000-1 OT SUSP: 3.0000 [drp] | Freq: Four times a day (QID) | OTIC | Status: DC

## 2013-10-04 MED ORDER — AMOXICILLIN-POT CLAVULANATE 875-125 MG PO TABS: 1.0000 | ORAL_TABLET | Freq: Two times a day (BID) | ORAL | Status: DC

## 2013-10-04 MED ORDER — FLUCONAZOLE 150 MG PO TABS: 150.0000 mg | ORAL_TABLET | Freq: Once | ORAL | Status: DC

## 2013-10-04 MED ORDER — DEXAMETHASONE SODIUM PHOSPHATE 10 MG/ML IJ SOLN
10.0000 mg | Freq: Once | INTRAMUSCULAR | Status: AC
  Administered 2013-10-04: 10 mg via INTRAMUSCULAR

## 2013-10-04 MED ORDER — HYDROCODONE-IBUPROFEN 7.5-200 MG PO TABS: 1.0000 | ORAL_TABLET | Freq: Four times a day (QID) | ORAL | Status: DC | PRN

## 2013-10-04 MED ORDER — DEXAMETHASONE SODIUM PHOSPHATE 10 MG/ML IJ SOLN
INTRAMUSCULAR | Status: AC
  Filled 2013-10-04: qty 1

## 2013-10-04 NOTE — ED Notes (Signed)
Dr Lorenz CoasterKeller has already seen pt Pt c/o left ear pain onset 3 days. Denies: f/v/n/d, cold sxs Alert w/no signs of acute distress.

## 2013-10-04 NOTE — Discharge Instructions (Signed)
Migraine Headache A migraine headache is an intense, throbbing pain on one or both sides of your head. A migraine can last for 30 minutes to several hours. CAUSES  The exact cause of a migraine headache is not always known. However, a migraine may be caused when nerves in the brain become irritated and release chemicals that cause inflammation. This causes pain. Certain things may also trigger migraines, such as:  Alcohol.  Smoking.  Stress.  Menstruation.  Aged cheeses.  Foods or drinks that contain nitrates, glutamate, aspartame, or tyramine.  Lack of sleep.  Chocolate.  Caffeine.  Hunger.  Physical exertion.  Fatigue.  Medicines used to treat chest pain (nitroglycerine), birth control pills, estrogen, and some blood pressure medicines. SIGNS AND SYMPTOMS  Pain on one or both sides of your head.  Pulsating or throbbing pain.  Severe pain that prevents daily activities.  Pain that is aggravated by any physical activity.  Nausea, vomiting, or both.  Dizziness.  Pain with exposure to bright lights, loud noises, or activity.  General sensitivity to bright lights, loud noises, or smells. Before you get a migraine, you may get warning signs that a migraine is coming (aura). An aura may include:  Seeing flashing lights.  Seeing bright spots, halos, or zig-zag lines.  Having tunnel vision or blurred vision.  Having feelings of numbness or tingling.  Having trouble talking.  Having muscle weakness. DIAGNOSIS  A migraine headache is often diagnosed based on:  Symptoms.  Physical exam.  A CT scan or MRI of your head. These imaging tests cannot diagnose migraines, but they can help rule out other causes of headaches. TREATMENT Medicines may be given for pain and nausea. Medicines can also be given to help prevent recurrent migraines.  HOME CARE INSTRUCTIONS  Only take over-the-counter or prescription medicines for pain or discomfort as directed by your  health care provider. The use of long-term narcotics is not recommended.  Lie down in a dark, quiet room when you have a migraine.  Keep a journal to find out what may trigger your migraine headaches. For example, write down:  What you eat and drink.  How much sleep you get.  Any change to your diet or medicines.  Limit alcohol consumption.  Quit smoking if you smoke.  Get 7 9 hours of sleep, or as recommended by your health care provider.  Limit stress.  Keep lights dim if bright lights bother you and make your migraines worse. SEEK IMMEDIATE MEDICAL CARE IF:   Your migraine becomes severe.  You have a fever.  You have a stiff neck.  You have vision loss.  You have muscular weakness or loss of muscle control.  You start losing your balance or have trouble walking.  You feel faint or pass out.  You have severe symptoms that are different from your first symptoms. MAKE SURE YOU:   Understand these instructions.  Will watch your condition.  Will get help right away if you are not doing well or get worse. Document Released: 09/04/2005 Document Revised: 06/25/2013 Document Reviewed: 05/12/2013 [facility] Patient Information 2014 [place], [place].  Otitis Externa Otitis externa is a bacterial or fungal infection of the outer ear canal. This is the area from the eardrum to the outside of the ear. Otitis externa is sometimes called "swimmer's ear." CAUSES  Possible causes of infection include:  Swimming in dirty water.  Moisture remaining in the ear after swimming or bathing.  Mild injury (trauma) to the ear.  Objects stuck  in the ear (foreign body).  Cuts or scrapes (abrasions) on the outside of the ear. SYMPTOMS  The first symptom of infection is often itching in the ear canal. Later signs and symptoms may include swelling and redness of the ear canal, ear pain, and yellowish-white fluid (pus) coming from the ear. The ear pain may be worse when pulling on the  earlobe. DIAGNOSIS  Your caregiver will perform a physical exam. A sample of fluid may be taken from the ear and examined for bacteria or fungi. TREATMENT  Antibiotic ear drops are often given for 10 to 14 days. Treatment may also include pain medicine or corticosteroids to reduce itching and swelling. PREVENTION   Keep your ear dry. Use the corner of a towel to absorb water out of the ear canal after swimming or bathing.  Avoid scratching or putting objects inside your ear. This can damage the ear canal or remove the protective wax that lines the canal. This makes it easier for bacteria and fungi to grow.  Avoid swimming in lakes, polluted water, or poorly chlorinated pools.  You may use ear drops made of rubbing alcohol and vinegar after swimming. Combine equal parts of white vinegar and alcohol in a bottle. Put 3 or 4 drops into each ear after swimming. HOME CARE INSTRUCTIONS   Apply antibiotic ear drops to the ear canal as prescribed by your caregiver.  Only take over-the-counter or prescription medicines for pain, discomfort, or fever as directed by your caregiver.  If you have diabetes, follow any additional treatment instructions from your caregiver.  Keep all follow-up appointments as directed by your caregiver. SEEK MEDICAL CARE IF:   You have a fever.  Your ear is still red, swollen, painful, or draining pus after 3 days.  Your redness, swelling, or pain gets worse.  You have a severe headache.  You have redness, swelling, pain, or tenderness in the area behind your ear. MAKE SURE YOU:   Understand these instructions.  Will watch your condition.  Will get help right away if you are not doing well or get worse. Document Released: 09/04/2005 Document Revised: 11/27/2011 Document Reviewed: 09/21/2011 [facility] Patient Information 2014 [place], [place].

## 2013-10-04 NOTE — ED Provider Notes (Signed)
Chief Complaint   Chief Complaint  Patient presents with  . Otalgia    History of Present Illness   Christina [name] is a 33 year old female who has had a one-week history of left ear pain and decreased hearing. She was seen by her primary care physician this past week. She was found to have a cerumen impaction. The ear was irrigated out. She is hearing better, but her ear is still very painful and is getting worse. In spreads to the entire left side of her head. She denies any swelling of the external ear. She's had no fever or chills. She has had a slight sore throat. No nasal congestion or drainage. She also has a long-standing history of migraine headaches. The current migraine has been going on all week long. Her primary care physician started her on Topamax and increasing dose, and she also has Relpax and Maxalt at home which she can take. She denies any visual or neurological symptoms. No stiff neck or fever.  Review of Systems   Other than as noted above, the patient denies any of the following symptoms: Systemic:  No fevers, chills, or sweats. Eye:  No redness, pain, discharge, itching, blurred vision, or diplopia. ENT:  No headache, nasal congestion, sneezing, itching, epistaxis, ear pain, congestion, decreased hearing, ringing in ears, vertigo, or tinnitus.  No oral lesions, sore throat, pain on swallowing, or hoarseness. Neck:  No mass, tenderness or adenopathy. Lungs:  No coughing, wheezing, or shortness of breath. Skin:  No rash or itching.  PMFSH   Past medical history, family history, social history, meds, and allergies were reviewed. She takes amitriptyline and Vicoprofen for her headaches as well as the medications mentioned above.  Physical Examination     Vital signs:  BP 96/65  Pulse 89  Temp(Src) 98.6 F (37 C) (Oral)  Resp 18  SpO2 95%  LMP 02/04/2013  Breastfeeding? No General:  Alert and oriented.  In no distress.  Skin warm and dry. Eye:  PERRL, full  EOMs, lids and conjunctiva normal.   ENT:  She has a cerumen impaction which has not been completely cleared, canal and TM are not visualized.  Nasal mucosa not congested and without drainage.  Mucous membranes moist, no oral lesions, normal dentition, pharynx clear.  No cranial or facial pain to palplation. Neck:  Supple, full ROM.  No adenopathy, tenderness or mass.  Thyroid normal. Lungs:  Breath sounds clear and equal bilaterally.  No wheezes, rales or rhonchi. Heart:  Rhythm regular, without extrasystoles.  No gallops or murmers. Skin:  Clear, warm and dry.   Course in Urgent Care Center   We attempted to irrigated out the ear canal, we did get a lot more wax out of the canal, but never completely cleared. The canal and TM were again not visualized.  For the migraine she was given Decadron 10 mg IM.  Assessment   The primary encounter diagnosis was Otitis externa. A diagnosis of Migraine headache was also pertinent to this visit.  I suggested that she use the drops at home, and if the ear pain continued, followup with Dr. Suzanna ObeyJohn Byers.  I also suggested she followup with Dr. Everlena CooperJaffe for her headaches.  Plan    1.  Meds:  The following meds were prescribed:   Discharge Medication List as of 10/04/2013  4:04 PM    START taking these medications   Details  amoxicillin-clavulanate (AUGMENTIN) 875-125 MG per tablet Take 1 tablet by mouth 2 (two) times  daily., Starting 10/04/2013, Until Discontinued, Normal    fluconazole (DIFLUCAN) 150 MG tablet Take 1 tablet (150 mg total) by mouth once., Starting 10/04/2013, Normal    HYDROcodone-ibuprofen (VICOPROFEN) 7.5-200 MG per tablet Take 1 tablet by mouth every 6 (six) hours as needed for moderate pain., Starting 10/04/2013, Until Discontinued, Print    neomycin-polymyxin-hydrocortisone (CORTISPORIN) 3.5-10000-1 otic suspension Place 3 drops into the left ear 4 (four) times daily., Starting 10/04/2013, Until Discontinued, Normal        2.   Patient Education/Counseling:  The patient was given appropriate handouts, self care instructions, and instructed in symptomatic relief.   3.  Follow up:  The patient was told to follow up here if no better in 3 to 4 days, or sooner if becoming worse in any way, and given some red flag symptoms such as fever, worsening ear pain, or new neurological symptoms which would prompt immediate return.  Follow up with Dr. Suzanna Obey for the ear pain and with Dr. Everlena Cooper for the headaches.     Reuben Likes, MD 10/04/13 2049

## 2013-10-13 ENCOUNTER — Telehealth: Payer: Self-pay

## 2013-10-13 NOTE — Telephone Encounter (Signed)
[facility] sent us afx saying Medicaid has changed their formulary.  They will now only cover Sumatriptan for migraine therapy.  They will not approve any other Triptans until patient has a documented trial and failure of Imitrex.  Would you like to change med?  Please advise.  Thank you.

## 2013-10-27 MED ORDER — SUMATRIPTAN SUCCINATE 100 MG PO TABS: 100.0000 mg | ORAL_TABLET | Freq: Once | ORAL | Status: DC | PRN

## 2013-10-27 NOTE — Telephone Encounter (Signed)
Will change to sumatriptan. -VRP

## 2013-10-27 NOTE — Addendum Note (Signed)
Addended byJoycelyn Schmid: PENUMALLI, VIKRAM on: 10/27/2013 04:46 PM   Modules accepted: Orders, Medications

## 2013-11-03 ENCOUNTER — Ambulatory Visit (INDEPENDENT_AMBULATORY_CARE_PROVIDER_SITE_OTHER): Payer: Medicaid Other | Admitting: Neurology

## 2013-11-03 ENCOUNTER — Encounter: Payer: Self-pay | Admitting: Neurology

## 2013-11-03 VITALS — BP 110/68 | HR 70 | Temp 98.0°F | Resp 16 | Ht 62.0 in | Wt 127.0 lb

## 2013-11-03 DIAGNOSIS — G43109 Migraine with aura, not intractable, without status migrainosus: Secondary | ICD-10-CM

## 2013-11-03 MED ORDER — TOPIRAMATE 50 MG PO TABS: 50.0000 mg | ORAL_TABLET | Freq: Every day | ORAL | Status: DC

## 2013-11-03 MED ORDER — RIZATRIPTAN BENZOATE 10 MG PO TBDP: 10.0000 mg | ORAL_TABLET | ORAL | Status: DC | PRN

## 2013-11-03 NOTE — Patient Instructions (Signed)
Migraine Recommendations: 1.  For acute migraine, take one Maxalt at immediate onset of migraine.  May repeat a dose in 2 hours if migraine continues or returns.  Do not exceed 2 pills in 24 hour period. 2.  Limit use of pain relievers to no more than 2 days out of the week.  These medications include acetaminophen, ibuprofen, triptans and narcotics.  This will help reduce risk of rebound headaches. 3.  Keep a headache diary. 4.  Stay adequately hydrated. 5.  Maintain good sleep hygiene. 6.  Maintain proper stress management. 7.  Increase Topamax to 50mg  at bedtime.  I wrote a prescription for 50mg  tablets.  Take 1 tablet at bedtime.  Call in 4 weeks with an update. 8.  Stop amitriptyline. 9.  Follow up in 3 months.

## 2013-11-03 NOTE — Progress Notes (Signed)
NEUROLOGY CONSULTATION NOTE  Christina [name] A Nealis MRN: [id number] DOB: 07-26-1981  Referring provider: ED referral Primary care provider: NA  Reason for consult:  migraine  HISTORY OF PRESENT ILLNESS: Christina [name] is a 33 year old right-handed woman with history of migraine who presents for migrain.  Records and images were personally reviewed where available.    Onset:  Early 20s Location:  Unilateral, variable, either side Quality:  throbbing Intensity:  10/10 Aura:  scotomas Associated symptoms:  Nausea, photophobia, phonophobia, osmophobia, sometimes vomiting Duration:  2 to 3 days Frequency:  3 to 4 times a month Triggers/exacerbating factors:  Stress, certain smells (burning food, cigarettes, fragrances) Relieving factors:  rest Activity:  Lays down in quiet dark place  Past abortive therapy:  Excedrin (variable effectiveness), Relpax (helpful), NSAIDS (ineffective), Tylenol (ineffective) Past preventative therapy:  None.  Current abortive therapy:  Maxalt (effective) Current preventative therapy:  Amitriptyline 25mg  daily (has been on it for several years.  Helpful initially.  Three weeks ago, started on topiramate 25mg  at bedtime.  Caffeine:  Pepsi, tea (trying to cut down) Stress/depression:  Under a lot of stress.  In Nursing school.  Father has cancer.  Mother has history of several strokes. Sleep hygiene:  poor Family history of headache:  father.  PAST MEDICAL HISTORY: Past Medical History  Diagnosis Date  . Migraine   . BV (bacterial vaginosis)   . Abnormal Pap smear     PAST SURGICAL HISTORY: Past Surgical History  Procedure Laterality Date  . Cesarean section    . Dental surgery      MEDICATIONS: Current Outpatient Prescriptions on File Prior to Visit  Medication Sig Dispense Refill  . amitriptyline (ELAVIL) 25 MG tablet Take 25 mg by mouth at bedtime.      Marland Kitchen. amoxicillin-clavulanate (AUGMENTIN) 875-125 MG per tablet Take 1 tablet by mouth 2  (two) times daily.  20 tablet  0  . butalbital-acetaminophen-caffeine (FIORICET) 50-325-40 MG per tablet Take 1 tablet by mouth every 6 (six) hours as needed for headache.  15 tablet  0  . Eletriptan Hydrobromide (RELPAX PO) Take by mouth.      . fluconazole (DIFLUCAN) 150 MG tablet Take 1 tablet (150 mg total) by mouth once.  2 tablet  3  . HYDROcodone-ibuprofen (VICOPROFEN) 7.5-200 MG per tablet Take 1 tablet by mouth every 6 (six) hours as needed for moderate pain.  30 tablet  0  . neomycin-polymyxin-hydrocortisone (CORTISPORIN) 3.5-10000-1 otic suspension Place 3 drops into the left ear 4 (four) times daily.  10 mL  0  . SUMAtriptan (IMITREX) 100 MG tablet Take 1 tablet (100 mg total) by mouth once as needed for migraine. May repeat x 1 after 2 hours; maximum 2 tabs per day and 8 tabs per month  8 tablet  6   No current facility-administered medications on file prior to visit.    ALLERGIES: No Known Allergies  FAMILY HISTORY: Family History  Problem Relation Age of Onset  . Hypertension Mother   . Stroke Mother   . Cancer Father   . Hypertension Father   . Migraines Father     SOCIAL HISTORY: History   Social History  . Marital Status: Single    Spouse Name: N/A    Number of Children: N/A  . Years of Education: N/A   Occupational History  . Not on file.   Social History Main Topics  . Smoking status: Never Smoker   . Smokeless tobacco: Not on file  . Alcohol  Use: Yes  . Drug Use: No  . Sexual Activity: Yes    Birth Control/ Protection: None   Other Topics Concern  . Not on file   Social History Narrative   Pt lives at home with her two children. She is single, has some college level education, and will be starting a new job on 12/05/12. She does drink caffeine.     REVIEW OF SYSTEMS: Constitutional: No fevers, chills, or sweats, no generalized fatigue, change in appetite Eyes: No visual changes, double vision, eye pain Ear, nose and throat: No hearing loss,  ear pain, nasal congestion, sore throat Cardiovascular: No chest pain, palpitations Respiratory:  No shortness of breath at rest or with exertion, wheezes GastrointestinaI: No nausea, vomiting, diarrhea, abdominal pain, fecal incontinence Genitourinary:  No dysuria, urinary retention or frequency Musculoskeletal:  No neck pain, back pain Integumentary: No rash, pruritus, skin lesions Neurological: as above Psychiatric: No depression, insomnia, anxiety Endocrine: No palpitations, fatigue, diaphoresis, mood swings, change in appetite, change in weight, increased thirst Hematologic/Lymphatic:  No anemia, purpura, petechiae. Allergic/Immunologic: no itchy/runny eyes, nasal congestion, recent allergic reactions, rashes  PHYSICAL EXAM: Filed Vitals:   11/03/13 1232  BP: 110/68  Pulse: 70  Temp: 98 F (36.7 C)  Resp: 16   General: No acute distress Head:  Normocephalic/atraumatic Neck: supple, no paraspinal tenderness, full range of motion Back: No paraspinal tenderness Heart: regular rate and rhythm Lungs: Clear to auscultation bilaterally. Vascular: No carotid bruits. Neurological Exam: Mental status: alert and oriented to person, place, and time, speech fluent and not dysarthric, language intact. Cranial nerves: CN I: not tested CN II: pupils equal, round and reactive to light, visual fields intact, fundi unremarkable. CN III, IV, VI:  full range of motion, no nystagmus, no ptosis CN V: facial sensation intact CN VII: upper and lower face symmetric CN VIII: hearing intact CN IX, X: gag intact, uvula midline CN XI: sternocleidomastoid and trapezius muscles intact CN XII: tongue midline Bulk & Tone: normal, no fasciculations. Motor: 5/5 throughout Sensation: temperature and vibration intact Deep Tendon Reflexes: 2+ throughout, toes down Finger to nose testing: no dysmetria Heel to shin: no dysmetria Gait: normal stance and stride.  Able to turn and walk in tandem. Romberg  negative.  IMPRESSION: Migraine with aura  PLAN: 1.  For simplicity, we will continue the topamax and increase dose to 50mg  at bedtime.  Side effects discussed, including paresthesias, foggy feeling, 1% kidneys stones, 1% glaucoma.  We will discontinue the amitriptyline 25mg  with understanding that this still is a viable option in the future if we titrate dose up. 2.  Refill Maxalt 10mg  3.  Follow up in 3 months.  Thank you for allowing me to take part in the care of this patient.  Shon Millet, DO

## 2013-11-10 ENCOUNTER — Emergency Department (HOSPITAL_COMMUNITY)
Admission: EM | Admit: 2013-11-10 | Discharge: 2013-11-10 | Disposition: A | Discharge status: Home / Self Care | Payer: Medicaid Other | Source: Home / Self Care | Attending: Emergency Medicine | Admitting: Emergency Medicine

## 2013-11-10 ENCOUNTER — Encounter (HOSPITAL_COMMUNITY): Payer: Self-pay | Admitting: Emergency Medicine

## 2013-11-10 DIAGNOSIS — R519 Headache, unspecified: Secondary | ICD-10-CM

## 2013-11-10 DIAGNOSIS — N912 Amenorrhea, unspecified: Secondary | ICD-10-CM

## 2013-11-10 DIAGNOSIS — R51 Headache: Secondary | ICD-10-CM

## 2013-11-10 DIAGNOSIS — E860 Dehydration: Secondary | ICD-10-CM

## 2013-11-10 DIAGNOSIS — G4700 Insomnia, unspecified: Secondary | ICD-10-CM

## 2013-11-10 LAB — POCT URINALYSIS DIP (DEVICE)
Bilirubin Urine: NEGATIVE
Glucose, UA: NEGATIVE mg/dL
Hgb urine dipstick: NEGATIVE
Ketones, ur: NEGATIVE mg/dL
LEUKOCYTES UA: NEGATIVE
Nitrite: NEGATIVE
PROTEIN: NEGATIVE mg/dL
Specific Gravity, Urine: >=1.03 (ref 1.005–1.030)
Urobilinogen, UA: 0.2 mg/dL (ref 0.0–1.0)
pH: 6 (ref 5.0–8.0)

## 2013-11-10 LAB — POCT PREGNANCY, URINE: Preg Test, Ur: NEGATIVE

## 2013-11-10 MED ORDER — ONDANSETRON 4 MG PO TBDP
4.0000 mg | ORAL_TABLET | Freq: Once | ORAL | Status: AC
  Administered 2013-11-10: 4 mg via ORAL

## 2013-11-10 MED ORDER — ONDANSETRON 4 MG PO TBDP
ORAL_TABLET | ORAL | Status: AC
  Filled 2013-11-10: qty 1

## 2013-11-10 MED ORDER — IBUPROFEN 800 MG PO TABS: 800.0000 mg | ORAL_TABLET | Freq: Once | ORAL | Status: DC

## 2013-11-10 MED ORDER — IBUPROFEN 800 MG PO TABS
ORAL_TABLET | ORAL | Status: AC
  Filled 2013-11-10: qty 1

## 2013-11-10 MED ORDER — ONDANSETRON HCL 4 MG PO TABS: 4.0000 mg | ORAL_TABLET | Freq: Three times a day (TID) | ORAL | Status: DC | PRN

## 2013-11-10 NOTE — Discharge Instructions (Signed)
Your urine studies were normal with the exception of dehydration and your pregnancy test was negative. I would advise you to contact your headache specialist about your continued headaches. You should follow up with your OB/GYN regarding that fact that you no longer have a menstrual cycle. I have also listed the contact information for [facility] so that you may contact them to establish for primary care needs. I would encourage you to make sure you are staying well hydrated, as dehydration will certainly make you feel unwell and serve as a trigger for your headaches. Diarrhea or loose bowel movements are often a self limited viral stomach/intestinal issue and the goal of therapy is to maintain adequate hydration as the body resolves the symptoms.

## 2013-11-10 NOTE — ED Provider Notes (Signed)
CSN: [id number]     Arrival date & time 11/10/13  1240 History   None    Chief Complaint  Patient presents with  . Headache     (Consider location/radiation/quality/duration/timing/severity/associated sxs/prior Treatment) HPI Comments: Patient arrives at [facility] with several complaints.  First she reports she is still having daily migraine headaches despite recent modifications (by her neurologist) of her medications. States her Topamax dose was adjusted and she was taken off of her amitriptyline. States she now has insomnia and because of this she feels generally unwell.  Also mentions recent development of nausea and loose bowel movements. Lastly, she want to know why her menstrual cycle has not resumed. Reports her last DepoMedrol injection was in July of 2014, but she has not followed up with her OB/GYN Has not established with PCP. Denies GU complaints. Denies changes in speech, vision or balance. Does not report vomiting. 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   The history is provided by the patient.    Past Medical History  Diagnosis Date  . Migraine   . BV (bacterial vaginosis)   . Abnormal Pap smear    Past Surgical History  Procedure Laterality Date  . Cesarean section    . Dental surgery     Family History  Problem Relation Age of Onset  . Hypertension Mother   . Stroke Mother   . Cancer Father   . Hypertension Father   . Migraines Father    History  Substance Use Topics  . Smoking status: Never Smoker   . Smokeless tobacco: Not on file  . Alcohol Use: Yes   OB History   Grav Para Term Preterm Abortions TAB SAB Ect Mult Living   4 2 2  1  1   2      Review of Systems  Constitutional: Positive for fatigue.  Eyes: Negative.   Respiratory: Negative.   Cardiovascular: Negative.   Gastrointestinal: Positive for nausea and diarrhea. Negative for vomiting,  constipation and blood in stool.  Endocrine: Negative for polydipsia, polyphagia and polyuria.  Genitourinary: Negative.   Musculoskeletal: Negative.   Skin: Negative.   Allergic/Immunologic: Negative for immunocompromised state.  Neurological: Positive for headaches.      Allergies  Review of patient's allergies indicates no known allergies.  Home Medications   Current Outpatient Rx  Name  Route  Sig  Dispense  Refill  . amitriptyline (ELAVIL) 25 MG tablet   Oral   Take 25 mg by mouth at bedtime.         . rizatriptan (MAXALT-MLT) 10 MG disintegrating tablet   Oral   Take 1 tablet (10 mg total) by mouth as needed for migraine. May repeat x1 in 2 hours if needed   9 tablet   11   . topiramate (TOPAMAX) 50 MG tablet   Oral   Take 1 tablet (50 mg total) by mouth at bedtime.   30 tablet   3   . amoxicillin-clavulanate (AUGMENTIN) 875-125 MG per tablet   Oral   Take 1 tablet by mouth 2 (two) times daily.   20 tablet   0   . butalbital-acetaminophen-caffeine (FIORICET) 50-325-40 MG per tablet   Oral   Take 1 tablet by mouth every 6 (six) hours as needed for headache.   15 tablet   0   . Eletriptan Hydrobromide (RELPAX PO)   Oral  Take by mouth.         . fluconazole (DIFLUCAN) 150 MG tablet   Oral   Take 1 tablet (150 mg total) by mouth once.   2 tablet   3   . HYDROcodone-ibuprofen (VICOPROFEN) 7.5-200 MG per tablet   Oral   Take 1 tablet by mouth every 6 (six) hours as needed for moderate pain.   30 tablet   0   . neomycin-polymyxin-hydrocortisone (CORTISPORIN) 3.5-10000-1 otic suspension   Left Ear   Place 3 drops into the left ear 4 (four) times daily.   10 mL   0   . ondansetron (ZOFRAN) 4 MG tablet   Oral   Take 1 tablet (4 mg total) by mouth every 8 (eight) hours as needed for nausea or vomiting.   12 tablet   0   . SUMAtriptan (IMITREX) 100 MG tablet   Oral   Take 1 tablet (100 mg total) by mouth once as needed for migraine. May  repeat x 1 after 2 hours; maximum 2 tabs per day and 8 tabs per month   8 tablet   6    BP 103/69  Pulse 74  Temp(Src) 97.5 F (36.4 C) (Oral)  Resp 14  SpO2 100%  LMP 02/04/2013 Physical Exam  Nursing note and vitals reviewed. Constitutional: She is oriented to person, place, and time. She appears well-developed and well-nourished. No distress.  HENT:  Head: Normocephalic and atraumatic.  Right Ear: Hearing, tympanic membrane, external ear and ear canal normal.  Left Ear: Hearing, tympanic membrane, external ear and ear canal normal.  Nose: Nose normal.  Mouth/Throat: Uvula is midline, oropharynx is clear and moist and mucous membranes are normal.  Eyes: Conjunctivae, EOM and lids are normal. Pupils are equal, round, and reactive to light. Right eye exhibits no discharge. Left eye exhibits no discharge. No scleral icterus.  Fundoscopic exam:      The right eye shows no arteriolar narrowing, no AV nicking, no exudate, no hemorrhage and no papilledema.       The left eye shows no arteriolar narrowing, no AV nicking, no exudate, no hemorrhage and no papilledema.  Neck: Normal range of motion. Neck supple. No thyromegaly present.  Cardiovascular: Normal rate, regular rhythm and normal heart sounds.   Pulmonary/Chest: Effort normal and breath sounds normal. No respiratory distress. She has no wheezes.  Lymphadenopathy:    She has no cervical adenopathy.  Neurological: She is alert and oriented to person, place, and time. She has normal strength. No cranial nerve deficit or sensory deficit. Coordination and gait normal. GCS eye subscore is 4. GCS verbal subscore is 5. GCS motor subscore is 6.  Skin: Skin is warm and dry. No rash noted. No erythema.  Psychiatric: She has a normal mood and affect. Her behavior is normal.    ED Course  Procedures (including critical care time) Labs Review Labs Reviewed  POCT URINALYSIS DIP (DEVICE)  POCT PREGNANCY, URINE   Imaging Review No results  found.    MDM   Final diagnoses:  Headache  Amenorrhea  Dehydration  Insomnia   Urine studies were normal with the exception of dehydration and pregnancy test was negative. advised patient to contact headache specialist about continued headaches and insomnia. Advised follow up with  OB/GYN regarding amenorrhea. Listed the contact information for [facility] so that she may contact them to establish for primary care needs. Encouraged her to make sure she is staying well hydrated, as dehydration will certainly  make her feel unwell and serve as a trigger for  Headaches.Educated patient that diarrhea or loose bowel movements are often a self limited viral stomach/intestinal issue and the goal of therapy is to maintain adequate hydration as the body resolves the symptoms.   Jess BartersJennifer Lee WagnerPresson, GeorgiaPA 11/10/13 [address]  Michi Herrmann Lee ReedsvillePresson, [place]PA 11/10/13 [phone]

## 2013-11-10 NOTE — ED Notes (Signed)
C/O headache, not feeling good, insomnia x 1 week, feels " out  Of whack".  Patient recently followed up with neurologist last Tuesday.  Topamax increased from 25 mg to 50mg .  Amitriptyline 25 mg was stopped.  Reports stomach "upset".  There has been a change in bowel routine: loose stool intermittent.

## 2013-11-10 NOTE — ED Provider Notes (Signed)
Medical screening examination/treatment/procedure(s) were performed by non-physician practitioner and as supervising physician I was immediately available for consultation/collaboration.  Leslee Homeavid Doniqua Saxby, M.D.   Reuben Likesavid C Felecia Stanfill, MD 11/10/13 2209

## 2013-11-15 ENCOUNTER — Inpatient Hospital Stay (HOSPITAL_COMMUNITY)
Admission: AD | Admit: 2013-11-15 | Discharge: 2013-11-15 | Disposition: A | Discharge status: Home / Self Care | Payer: Medicaid Other | Source: Ambulatory Visit | Attending: Obstetrics and Gynecology | Admitting: Obstetrics and Gynecology

## 2013-11-15 ENCOUNTER — Encounter (HOSPITAL_COMMUNITY): Payer: Self-pay | Admitting: *Deleted

## 2013-11-15 DIAGNOSIS — R51 Headache: Secondary | ICD-10-CM | POA: Insufficient documentation

## 2013-11-15 DIAGNOSIS — N898 Other specified noninflammatory disorders of vagina: Secondary | ICD-10-CM | POA: Insufficient documentation

## 2013-11-15 DIAGNOSIS — B373 Candidiasis of vulva and vagina: Secondary | ICD-10-CM

## 2013-11-15 DIAGNOSIS — B3731 Acute candidiasis of vulva and vagina: Secondary | ICD-10-CM

## 2013-11-15 LAB — WET PREP, GENITAL
Clue Cells Wet Prep HPF POC: NONE SEEN
TRICH WET PREP: NONE SEEN
YEAST WET PREP: NONE SEEN

## 2013-11-15 LAB — URINALYSIS, ROUTINE W REFLEX MICROSCOPIC
BILIRUBIN URINE: NEGATIVE
Glucose, UA: NEGATIVE mg/dL
HGB URINE DIPSTICK: NEGATIVE
Ketones, ur: NEGATIVE mg/dL
Leukocytes, UA: NEGATIVE
Nitrite: NEGATIVE
PH: 6 (ref 5.0–8.0)
Protein, ur: NEGATIVE mg/dL
Specific Gravity, Urine: <1.005 — ABNORMAL LOW (ref 1.005–1.030)
UROBILINOGEN UA: 0.2 mg/dL (ref 0.0–1.0)

## 2013-11-15 LAB — POCT PREGNANCY, URINE: Preg Test, Ur: NEGATIVE

## 2013-11-15 MED ORDER — FLUCONAZOLE 150 MG PO TABS: 150.0000 mg | ORAL_TABLET | Freq: Every day | ORAL | Status: DC

## 2013-11-15 NOTE — MAU Provider Note (Signed)
History     CSN: [id number]  Arrival date and time: 11/15/13 1630   None     Chief Complaint  Patient presents with  . Vaginal Discharge   HPI Comments: Christina [name] 33 y.o. [id number] presents to MAU with vaginal discharge that is itching. She has not taken any OTC medications.  It has been ongoing for several days. She has no new partner. Uses condoms 100 % of time.  Is finishing up LPN school.  Vaginal Discharge The patient's primary symptoms include a vaginal discharge. Associated symptoms include headaches.      Past Medical History  Diagnosis Date  . Migraine   . BV (bacterial vaginosis)   . Abnormal Pap smear     Past Surgical History  Procedure Laterality Date  . Cesarean section    . Dental surgery      Family History  Problem Relation Age of Onset  . Hypertension Mother   . Stroke Mother   . Cancer Father   . Hypertension Father   . Migraines Father     History  Substance Use Topics  . Smoking status: Never Smoker   . Smokeless tobacco: Not on file  . Alcohol Use: Yes    Allergies: No Known Allergies  Prescriptions prior to admission  Medication Sig Dispense Refill  . amitriptyline (ELAVIL) 25 MG tablet Take 25 mg by mouth at bedtime.      . butalbital-acetaminophen-caffeine (FIORICET) 50-325-40 MG per tablet Take 1 tablet by mouth every 6 (six) hours as needed for headache.  15 tablet  0  . Eletriptan Hydrobromide (RELPAX PO) Take by mouth.      Marland Kitchen HYDROcodone-ibuprofen (VICOPROFEN) 7.5-200 MG per tablet Take 1 tablet by mouth every 6 (six) hours as needed for moderate pain.  30 tablet  0  . topiramate (TOPAMAX) 50 MG tablet Take 1 tablet (50 mg total) by mouth at bedtime.  30 tablet  3    Review of Systems  Constitutional: Negative.   Eyes: Negative.   Respiratory: Negative.   Cardiovascular: Negative.   Gastrointestinal: Negative.   Genitourinary: Positive for vaginal discharge.       Vaginal discharge  Musculoskeletal: Negative.    Skin: Negative.   Neurological: Positive for headaches.  Psychiatric/Behavioral: Negative.    Physical Exam   Last menstrual period 02/04/2013.  Physical Exam  Constitutional: She is oriented to person, place, and time. She appears well-developed and well-nourished. No distress.  HENT:  Head: Normocephalic and atraumatic.  Eyes: Pupils are equal, round, and reactive to light.  Neck: Normal range of motion.  Cardiovascular: Normal rate, regular rhythm and normal heart sounds.   Respiratory: Effort normal and breath sounds normal.  GI: Soft. Bowel sounds are normal.  Genitourinary:  Genital: External negative Vaginal:thick white clumpy Cervix:closed/ long Bimanual:negative   Musculoskeletal: Normal range of motion.  Neurological: She is alert and oriented to person, place, and time.  Skin: Skin is warm and dry.  Psychiatric: She has a normal mood and affect.  Little anxious   Results for orders placed during the hospital encounter of 11/15/13 (from the past 24 hour(s))  URINALYSIS, ROUTINE W REFLEX MICROSCOPIC     Status: Abnormal   Collection Time    11/15/13  4:30 PM      Result Value Ref Range   Color, Urine YELLOW  YELLOW   APPearance CLEAR  CLEAR   Specific Gravity, Urine <1.005 (*) 1.005 - 1.030   pH 6.0  5.0 - 8.0  Glucose, UA NEGATIVE  NEGATIVE mg/dL   Hgb urine dipstick NEGATIVE  NEGATIVE   Bilirubin Urine NEGATIVE  NEGATIVE   Ketones, ur NEGATIVE  NEGATIVE mg/dL   Protein, ur NEGATIVE  NEGATIVE mg/dL   Urobilinogen, UA 0.2  0.0 - 1.0 mg/dL   Nitrite NEGATIVE  NEGATIVE   Leukocytes, UA NEGATIVE  NEGATIVE  POCT PREGNANCY, URINE     Status: None   Collection Time    11/15/13  4:41 PM      Result Value Ref Range   Preg Test, Ur NEGATIVE  NEGATIVE  WET PREP, GENITAL     Status: Abnormal   Collection Time    11/15/13  4:50 PM      Result Value Ref Range   Yeast Wet Prep HPF POC NONE SEEN  NONE SEEN   Trich, Wet Prep NONE SEEN  NONE SEEN   Clue Cells Wet  Prep HPF POC NONE SEEN  NONE SEEN   WBC, Wet Prep HPF POC FEW (*) NONE SEEN    MAU Course  Procedures  MDM  Wet prep/ GC/Chlamydia  Assessment and Plan   A:  Presumptive yeast  P: Diflucan 150 mg po Follow up with [facility] as needed  Carolynn ServeBarefoot, Mariadelaluz Guggenheim Miller 11/15/2013, 5:04 PM

## 2013-11-15 NOTE — MAU Provider Note (Signed)
Attestation of Attending Supervision of Advanced Practitioner: Evaluation and management procedures were performed by the PA/NP/CNM/OB Fellow under my supervision/collaboration. Chart reviewed and agree with management and plan.  Catera Hankins V 11/15/2013 8:16 PM   

## 2013-11-15 NOTE — Discharge Instructions (Signed)
Candida Infection, Adult A candida infection (also called yeast, fungus and Monilia infection) is an overgrowth of yeast that can occur anywhere on the body. A yeast infection commonly occurs in warm, moist body areas. Usually, the infection remains localized but can spread to become a systemic infection. A yeast infection may be a sign of a more severe disease such as diabetes, leukemia, or AIDS. A yeast infection can occur in both men and women. In women, Candida vaginitis is a vaginal infection. It is one of the most common causes of vaginitis. Men usually do not have symptoms or know they have an infection until other problems develop. Men may find out they have a yeast infection because their sex partner has a yeast infection. Uncircumcised men are more likely to get a yeast infection than circumcised men. This is because the uncircumcised glans is not exposed to air and does not remain as dry as that of a circumcised glans. Older adults may develop yeast infections around dentures. CAUSES  Women  Antibiotics.  Steroid medication taken for a long time.  Being overweight (obese).  Diabetes.  Poor immune condition.  Certain serious medical conditions.  Immune suppressive medications for organ transplant patients.  Chemotherapy.  Pregnancy.  Menstration.  Stress and fatigue.  Intravenous drug use.  Oral contraceptives.  Wearing tight-fitting clothes in the crotch area.  Catching it from a sex partner who has a yeast infection.  Spermicide.  Intravenous, urinary, or other catheters. Men  Catching it from a sex partner who has a yeast infection.  Having oral or anal sex with a person who has the infection.  Spermicide.  Diabetes.  Antibiotics.  Poor immune system.  Medications that suppress the immune system.  Intravenous drug use.  Intravenous, urinary, or other catheters. SYMPTOMS  Women  Thick, white vaginal discharge.  Vaginal itching.  Redness and  swelling in and around the vagina.  Irritation of the lips of the vagina and perineum.  Blisters on the vaginal lips and perineum.  Painful sexual intercourse.  Low blood sugar (hypoglycemia).  Painful urination.  Bladder infections.  Intestinal problems such as constipation, indigestion, bad breath, bloating, increase in gas, diarrhea, or loose stools. Men  Men may develop intestinal problems such as constipation, indigestion, bad breath, bloating, increase in gas, diarrhea, or loose stools.  Dry, cracked skin on the penis with itching or discomfort.  Jock itch.  Dry, flaky skin.  Athlete's foot.  Hypoglycemia. DIAGNOSIS  Women  A history and an exam are performed.  The discharge may be examined under a microscope.  A culture may be taken of the discharge. Men  A history and an exam are performed.  Any discharge from the penis or areas of cracked skin will be looked at under the microscope and cultured.  Stool samples may be cultured. TREATMENT  Women  Vaginal antifungal suppositories and creams.  Medicated creams to decrease irritation and itching on the outside of the vagina.  Warm compresses to the perineal area to decrease swelling and discomfort.  Oral antifungal medications.  Medicated vaginal suppositories or cream for repeated or recurrent infections.  Wash and dry the irritation areas before applying the cream.  Eating yogurt with lactobacillus may help with prevention and treatment.  Sometimes painting the vagina with gentian violet solution may help if creams and suppositories do not work. Men  Antifungal creams and oral antifungal medications.  Sometimes treatment must continue for 30 days after the symptoms go away to prevent recurrence. HOME CARE   INSTRUCTIONS  Women  Use cotton underwear and avoid tight-fitting clothing.  Avoid colored, scented toilet paper and deodorant tampons or pads.  Do not douche.  Keep your diabetes  under control.  Finish all the prescribed medications.  Keep your skin clean and dry.  Consume milk or yogurt with lactobacillus active culture regularly. If you get frequent yeast infections and think that is what the infection is, there are over-the-counter medications that you can get. If the infection does not show healing in 3 days, talk to your caregiver.  Tell your sex partner you have a yeast infection. Your partner may need treatment also, especially if your infection does not clear up or recurs. Men  Keep your skin clean and dry.  Keep your diabetes under control.  Finish all prescribed medications.  Tell your sex partner that you have a yeast infection so they can be treated if necessary. SEEK MEDICAL CARE IF:   Your symptoms do not clear up or worsen in one week after treatment.  You have an oral temperature above 102 F (38.9 C).  You have trouble swallowing or eating for a prolonged time.  You develop blisters on and around your vagina.  You develop vaginal bleeding and it is not your menstrual period.  You develop abdominal pain.  You develop intestinal problems as mentioned above.  You get weak or lightheaded.  You have painful or increased urination.  You have pain during sexual intercourse. MAKE SURE YOU:   Understand these instructions.  Will watch your condition.  Will get help right away if you are not doing well or get worse. Document Released: 10/12/2004 Document Revised: 11/27/2011 Document Reviewed: 01/24/2010 Exit[employer] Patient Information 2014 [employer].  

## 2013-11-15 NOTE — MAU Note (Signed)
Pt presents with complaints of discharge from her rectum and vaginal area.

## 2013-11-17 LAB — GC/CHLAMYDIA PROBE AMP
CT PROBE, AMP APTIMA: NEGATIVE
GC Probe RNA: NEGATIVE

## 2013-12-11 ENCOUNTER — Other Ambulatory Visit: Payer: Self-pay | Admitting: Diagnostic Neuroimaging

## 2014-01-02 ENCOUNTER — Emergency Department (HOSPITAL_COMMUNITY)
Admission: EM | Admit: 2014-01-02 | Discharge: 2014-01-02 | Disposition: A | Discharge status: Home / Self Care | Payer: Medicaid Other | Attending: Emergency Medicine | Admitting: Emergency Medicine

## 2014-01-02 ENCOUNTER — Encounter (HOSPITAL_COMMUNITY): Payer: Self-pay | Admitting: Emergency Medicine

## 2014-01-02 DIAGNOSIS — R1013 Epigastric pain: Secondary | ICD-10-CM | POA: Insufficient documentation

## 2014-01-02 DIAGNOSIS — R197 Diarrhea, unspecified: Secondary | ICD-10-CM | POA: Insufficient documentation

## 2014-01-02 DIAGNOSIS — Z79899 Other long term (current) drug therapy: Secondary | ICD-10-CM | POA: Insufficient documentation

## 2014-01-02 DIAGNOSIS — K219 Gastro-esophageal reflux disease without esophagitis: Secondary | ICD-10-CM | POA: Insufficient documentation

## 2014-01-02 DIAGNOSIS — Z8619 Personal history of other infectious and parasitic diseases: Secondary | ICD-10-CM | POA: Insufficient documentation

## 2014-01-02 DIAGNOSIS — Z8742 Personal history of other diseases of the female genital tract: Secondary | ICD-10-CM | POA: Insufficient documentation

## 2014-01-02 DIAGNOSIS — Z3202 Encounter for pregnancy test, result negative: Secondary | ICD-10-CM | POA: Insufficient documentation

## 2014-01-02 DIAGNOSIS — G43909 Migraine, unspecified, not intractable, without status migrainosus: Secondary | ICD-10-CM | POA: Insufficient documentation

## 2014-01-02 DIAGNOSIS — Z9889 Other specified postprocedural states: Secondary | ICD-10-CM | POA: Insufficient documentation

## 2014-01-02 LAB — URINALYSIS, ROUTINE W REFLEX MICROSCOPIC
Bilirubin Urine: NEGATIVE
GLUCOSE, UA: NEGATIVE mg/dL
HGB URINE DIPSTICK: NEGATIVE
Ketones, ur: NEGATIVE mg/dL
Leukocytes, UA: NEGATIVE
Nitrite: NEGATIVE
PH: 6 (ref 5.0–8.0)
Protein, ur: NEGATIVE mg/dL
SPECIFIC GRAVITY, URINE: 1.004 — AB (ref 1.005–1.030)
UROBILINOGEN UA: 0.2 mg/dL (ref 0.0–1.0)

## 2014-01-02 LAB — COMPREHENSIVE METABOLIC PANEL
ALK PHOS: 53 U/L (ref 39–117)
ALT: 11 U/L (ref 0–35)
AST: 16 U/L (ref 0–37)
Albumin: 4.5 g/dL (ref 3.5–5.2)
BUN: 5 mg/dL — ABNORMAL LOW (ref 6–23)
CO2: 28 mEq/L (ref 19–32)
Calcium: 9.5 mg/dL (ref 8.4–10.5)
Chloride: 100 mEq/L (ref 96–112)
Creatinine, Ser: 0.73 mg/dL (ref 0.50–1.10)
GFR calc non Af Amer: >90 mL/min (ref >90)
GLUCOSE: 84 mg/dL (ref 70–99)
Potassium: 3.2 mEq/L — ABNORMAL LOW (ref 3.7–5.3)
SODIUM: 141 meq/L (ref 137–147)
TOTAL PROTEIN: 7.8 g/dL (ref 6.0–8.3)
Total Bilirubin: 0.5 mg/dL (ref 0.3–1.2)

## 2014-01-02 LAB — CBC WITH DIFFERENTIAL/PLATELET
Basophils Absolute: 0 10*3/uL (ref 0.0–0.1)
Basophils Relative: 0 % (ref 0–1)
EOS ABS: 0 10*3/uL (ref 0.0–0.7)
Eosinophils Relative: 0 % (ref 0–5)
HCT: 37.7 % (ref 36.0–46.0)
Hemoglobin: 12.8 g/dL (ref 12.0–15.0)
LYMPHS ABS: 2.1 10*3/uL (ref 0.7–4.0)
LYMPHS PCT: 38 % (ref 12–46)
MCH: 30.4 pg (ref 26.0–34.0)
MCHC: 34 g/dL (ref 30.0–36.0)
MCV: 89.5 fL (ref 78.0–100.0)
Monocytes Absolute: 0.7 10*3/uL (ref 0.1–1.0)
Monocytes Relative: 12 % (ref 3–12)
Neutro Abs: 2.8 10*3/uL (ref 1.7–7.7)
Neutrophils Relative %: 50 % (ref 43–77)
PLATELETS: 204 10*3/uL (ref 150–400)
RBC: 4.21 MIL/uL (ref 3.87–5.11)
RDW: 11.6 % (ref 11.5–15.5)
WBC: 5.6 10*3/uL (ref 4.0–10.5)

## 2014-01-02 LAB — POC URINE PREG, ED: Preg Test, Ur: NEGATIVE

## 2014-01-02 LAB — LIPASE, BLOOD: Lipase: 25 U/L (ref 11–59)

## 2014-01-02 MED ORDER — SUCRALFATE 1 G PO TABS: 1.0000 g | ORAL_TABLET | Freq: Three times a day (TID) | ORAL | Status: DC

## 2014-01-02 MED ORDER — OMEPRAZOLE 20 MG PO CPDR: DELAYED_RELEASE_CAPSULE | ORAL | Status: DC

## 2014-01-02 NOTE — Progress Notes (Signed)
  CARE MANAGEMENT ED NOTE 01/02/2014  Patient:  Lenora BoysWILLIAMS,Atiyana A   Account Number:  [id number]  Date Initiated:  01/02/2014  Documentation initiated by:  Radford PaxFERRERO,Jeriko Kowalke  Subjective/Objective Assessment:   Patient presents to ED with n/v/d for one month     Subjective/Objective Assessment Detail:     Action/Plan:   Action/Plan Detail:   Anticipated DC Date:  01/02/2014     Status Recommendation to Physician:   Result of Recommendation:    Other ED Services  Consult Working Plan    DC Planning Services  Other  PCP issues    Choice offered to / List presented to:            Status of service:  Completed, signed off  ED Comments:   ED Comments Detail:  EDCM spoke to patient at bedside.  As per patient, "[facility], but I'm looking for a new doctor."  Patient confirms she has an open Medicaid plan.  [facility] provided patient with a list of pcps who accept Medicaid insurance within [facility]. Patient thankful for resources.  No further EDCM needs at this time.

## 2014-01-02 NOTE — ED Notes (Signed)
Bed: WA07 Expected date:  Expected time:  Means of arrival:  Comments: Hold for triage 1 

## 2014-01-02 NOTE — ED Notes (Signed)
Per pt, states N/V/D for about a month-states she gets relief from tums

## 2014-01-02 NOTE — ED Notes (Signed)
Pt reports n/v/d for over a month, was seen at urgent care and was prescribed zofran which doesn't help a lot. Pt also sts she takes pepcid and tums witch helps with hearburn. 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 Pt is tearful during assessment

## 2014-01-02 NOTE — ED Provider Notes (Signed)
CSN: [id number]     Arrival date & time 01/02/14  1604 History   First MD Initiated Contact with Patient 01/02/14 1641     Chief Complaint  Patient presents with  . N/V/D      (Consider location/radiation/quality/duration/timing/severity/associated sxs/prior Treatment) HPI Comments: Patient presents with h/o GERD, h/o c-section and no other abd surgeries -- c/o N/V/D x 1 month as well as intermittent lower abd cramping and periumbilical cramping. She has not yet followed up with PCP. Diarrhea is watery. Vomiting is usually after eating. Pain does not radiate. No fever, urinary sx, vaginal d/c. She has h/o heartburn. Symptoms are worsened with certain foods (pizza, spaghetti). She saw an urgent care who rx zofran which helps when patient awakens with nausea in the middle of the night. Friend gave her tums and pepcid which she has been using for several days with improvement in symptoms. Patient reports being stressed with family and school. The onset of this condition was acute. The course is waxing and waning.     The history is provided by the patient.    Past Medical History  Diagnosis Date  . Migraine   . BV (bacterial vaginosis)   . Abnormal Pap smear    Past Surgical History  Procedure Laterality Date  . Cesarean section    . Dental surgery     Family History  Problem Relation Age of Onset  . Hypertension Mother   . Stroke Mother   . Cancer Father   . Hypertension Father   . Migraines Father    History  Substance Use Topics  . Smoking status: Never Smoker   . Smokeless tobacco: Not on file  . Alcohol Use: Yes   OB History   Grav Para Term Preterm Abortions TAB SAB Ect Mult Living   4 2 2  2  2   2      Review of Systems  Constitutional: Negative for fever.  HENT: Negative for rhinorrhea and sore throat.   Eyes: Negative for redness.  Respiratory: Negative for cough.   Cardiovascular: Negative for chest pain.  Gastrointestinal: Positive for nausea, vomiting,  abdominal pain and diarrhea. Negative for blood in stool.  Genitourinary: Negative for dysuria, vaginal bleeding and vaginal discharge.  Musculoskeletal: Negative for myalgias.  Skin: Negative for rash.  Neurological: Negative for headaches.    Allergies  Review of patient's allergies indicates no known allergies.  Home Medications   Prior to Admission medications   Medication Sig Start Date End Date Taking? Authorizing Provider  calcium carbonate (TUMS - DOSED IN MG ELEMENTAL CALCIUM) 500 MG chewable tablet Chew 1 tablet by mouth 2 (two) times daily with a meal.    Yes Historical Provider, MD  eletriptan (RELPAX) 40 MG tablet Take 40 mg by mouth as needed for migraine or headache. One tablet by mouth at onset of headache. May repeat in 2 hours if headache persists or recurs.   Yes Historical Provider, MD  famotidine (PEPCID AC) 10 MG chewable tablet Chew 10 mg by mouth 2 (two) times daily as needed (stomach pain).    Yes Historical Provider, MD  HYDROcodone-ibuprofen (VICOPROFEN) 7.5-200 MG per tablet Take 1 tablet by mouth every 6 (six) hours as needed for moderate pain. 10/04/13  Yes Reuben Likesavid C Keller, MD  rizatriptan (MAXALT-MLT) 10 MG disintegrating tablet Take 10 mg by mouth as needed for migraine. May repeat in 2 hours if needed   Yes Historical Provider, MD  topiramate (TOPAMAX) 50 MG tablet Take 1 tablet (  50 mg total) by mouth at bedtime. 11/03/13  Yes Adam Gus Rankinobert Jaffe, DO   BP 115/55  Pulse 77  Temp(Src) 98.4 F (36.9 C) (Oral)  Resp 16  SpO2 100%  Physical Exam  Nursing note and vitals reviewed. Constitutional: She appears well-developed and well-nourished.  HENT:  Head: Normocephalic and atraumatic.  Eyes: Conjunctivae are normal. Right eye exhibits no discharge. Left eye exhibits no discharge.  Neck: Normal range of motion. Neck supple.  Cardiovascular: Normal rate, regular rhythm and normal heart sounds.   Pulmonary/Chest: Effort normal and breath sounds normal.   Abdominal: Soft. Bowel sounds are normal. There is tenderness (mild epigastric). There is no rebound and no guarding.  Neurological: She is alert.  Skin: Skin is warm and dry.  Psychiatric: She has a normal mood and affect.    ED Course  Procedures (including critical care time) Labs Review Labs Reviewed  COMPREHENSIVE METABOLIC PANEL - Abnormal; Notable for the following:    Potassium 3.2 (*)    BUN 5 (*)    All other components within normal limits  URINALYSIS, ROUTINE W REFLEX MICROSCOPIC - Abnormal; Notable for the following:    Specific Gravity, Urine 1.004 (*)    All other components within normal limits  CBC WITH DIFFERENTIAL  LIPASE, BLOOD  POC URINE PREG, ED    Imaging Review No results found.   EKG Interpretation None      5:35 PM Patient seen and examined. Work-up initiated. Medications ordered.   Vital signs reviewed and are as follows: Filed Vitals:   01/02/14 1613  BP: 115/55  Pulse: 77  Temp: 98.4 F (36.9 C)  Resp: 16   7:08 PM Labs reassuring, patient stable.   PCP/GI referrals given. Will start omeprazole and carafate.   The patient was urged to return to the Emergency Department immediately with worsening of current symptoms, worsening abdominal pain, persistent vomiting, blood noted in stools, fever, or any other concerns. The patient verbalized understanding.     MDM   Final diagnoses:  Epigastric pain   Patient with abd pain > 1 month, worse with food, better with pepcid/tums. Abd is soft here. Labs reassuring. History and exam suspicious for PUD. Do not suspect perforation. She is tolerating PO's. Vitals are stable, no fever.  No signs of dehydration, tolerating PO's. Lungs are clear. No focal abdominal pain, no concern for appendicitis, cholecystitis, pancreatitis, ruptured viscus, UTI, kidney stone, or any other abdominal etiology. Supportive therapy indicated with return if symptoms worsen. Patient counseled. She agrees with plan.       Renne CriglerJoshua Loisann Roach, PA-C 01/02/14 1910

## 2014-01-02 NOTE — Discharge Instructions (Signed)
Please read and follow all provided instructions.  Your diagnoses today include:  1. Epigastric pain     Tests performed today include:  Blood counts and electrolytes  Blood tests to check liver and kidney function  Blood tests to check pancreas function  Urine test to look for infection and pregnancy (in women)  Vital signs. See below for your results today.   Medications prescribed:   Carafate - for stomach upset and to protect your stomach   Omeprazole (Prilosec) - stomach acid reducer  This medication can be found over-the-counter  Take any prescribed medications only as directed.  Home care instructions:   Follow any educational materials contained in this packet.  Avoid alcohol and NSAID medications such as aspirin, ibuprofen, and naproxen.   Avoid foods that upset your stomach  Follow-up instructions: Please follow-up with your primary care provider in the next 3 days for further evaluation of your symptoms. If you do not have a primary care doctor -- see below for referral information.   Return instructions:  SEEK IMMEDIATE MEDICAL ATTENTION IF:  The pain does not go away or becomes severe   A temperature above 101F develops   Repeated vomiting occurs (multiple episodes)   The pain becomes localized to portions of the abdomen. The right side could possibly be appendicitis. In an adult, the left lower portion of the abdomen could be colitis or diverticulitis.   Blood is being passed in stools or vomit (bright red or black tarry stools)   You develop chest pain, difficulty breathing, dizziness or fainting, or become confused, poorly responsive, or inconsolable (young children)  If you have any other emergent concerns regarding your health  Additional Information: Abdominal (belly) pain can be caused by many things. Your caregiver performed an examination and possibly ordered blood/urine tests and imaging (CT scan, x-rays, ultrasound). Many cases can be  observed and treated at home after initial evaluation in the emergency department. Even though you are being discharged home, abdominal pain can be unpredictable. Therefore, you need a repeated exam if your pain does not resolve, returns, or worsens. Most patients with abdominal pain don't have to be admitted to the hospital or have surgery, but serious problems like appendicitis and gallbladder attacks can start out as nonspecific pain. Many abdominal conditions cannot be diagnosed in one visit, so follow-up evaluations are very important.  Your vital signs today were: BP 101/50   Pulse 61   Temp(Src) 97.6 F (36.4 C) (Oral)   Resp 14   SpO2 100% If your blood pressure (bp) was elevated above 135/85 this visit, please have this repeated by your doctor within one month. --------------

## 2014-01-02 NOTE — ED Provider Notes (Signed)
Medical screening examination/treatment/procedure(s) were performed by non-physician practitioner and as supervising physician I was immediately available for consultation/collaboration.   EKG Interpretation None        Mikail Goostree, MD 01/02/14 2144 

## 2014-02-03 ENCOUNTER — Ambulatory Visit: Payer: Medicaid Other | Admitting: Neurology

## 2014-02-10 ENCOUNTER — Telehealth: Payer: Self-pay | Admitting: Neurology

## 2014-02-10 NOTE — Telephone Encounter (Signed)
Pt no showed 3 month follow up appt w/ Dr. Everlena Cooper 02/03/14. No show letter mailed to pt / Sherri S.

## 2014-03-03 ENCOUNTER — Inpatient Hospital Stay (HOSPITAL_COMMUNITY)
Admission: AD | Admit: 2014-03-03 | Discharge: 2014-03-03 | Disposition: A | Discharge status: Home / Self Care | Payer: Medicaid Other | Source: Ambulatory Visit | Attending: Obstetrics & Gynecology | Admitting: Obstetrics & Gynecology

## 2014-03-03 ENCOUNTER — Encounter (HOSPITAL_COMMUNITY): Payer: Self-pay | Admitting: *Deleted

## 2014-03-03 DIAGNOSIS — N939 Abnormal uterine and vaginal bleeding, unspecified: Secondary | ICD-10-CM

## 2014-03-03 DIAGNOSIS — R109 Unspecified abdominal pain: Secondary | ICD-10-CM | POA: Insufficient documentation

## 2014-03-03 DIAGNOSIS — R112 Nausea with vomiting, unspecified: Secondary | ICD-10-CM | POA: Insufficient documentation

## 2014-03-03 DIAGNOSIS — N898 Other specified noninflammatory disorders of vagina: Secondary | ICD-10-CM

## 2014-03-03 LAB — URINALYSIS, ROUTINE W REFLEX MICROSCOPIC
Glucose, UA: NEGATIVE mg/dL
Ketones, ur: 15 mg/dL — AB
LEUKOCYTES UA: NEGATIVE
Nitrite: NEGATIVE
PH: 7 (ref 5.0–8.0)
Protein, ur: 30 mg/dL — AB
Specific Gravity, Urine: 1.01 (ref 1.005–1.030)
Urobilinogen, UA: 1 mg/dL (ref 0.0–1.0)

## 2014-03-03 LAB — CBC
HCT: 38 % (ref 36.0–46.0)
Hemoglobin: 12.7 g/dL (ref 12.0–15.0)
MCH: 30.8 pg (ref 26.0–34.0)
MCHC: 33.4 g/dL (ref 30.0–36.0)
MCV: 92.2 fL (ref 78.0–100.0)
Platelets: 181 10*3/uL (ref 150–400)
RBC: 4.12 MIL/uL (ref 3.87–5.11)
RDW: 11.8 % (ref 11.5–15.5)
WBC: 7 10*3/uL (ref 4.0–10.5)

## 2014-03-03 LAB — WET PREP, GENITAL
Clue Cells Wet Prep HPF POC: NONE SEEN
TRICH WET PREP: NONE SEEN
WBC, Wet Prep HPF POC: NONE SEEN

## 2014-03-03 LAB — URINE MICROSCOPIC-ADD ON

## 2014-03-03 LAB — POCT PREGNANCY, URINE: PREG TEST UR: NEGATIVE

## 2014-03-03 LAB — HCG, QUANTITATIVE, PREGNANCY

## 2014-03-03 NOTE — MAU Note (Signed)
Patient states her last Depo was in July 2014. Her first period after that was 4-22 thru 4-26 with spotting until 4-30. States she started bleeding again on 5-29 till 6-5. Started spotting again on 6-14 and heavy today with severe abdominal cramping. States she had 2 pregnancy tests in May because her period was late and one was positive and one negative. Thought she had miscarried. Has had nausea with occasional vomiting.

## 2014-03-03 NOTE — Discharge Instructions (Signed)
Abnormal Uterine Bleeding Abnormal uterine bleeding means bleeding from the vagina that is not your normal menstrual period. This can be:  Bleeding or spotting between periods.  Bleeding after sex (sexual intercourse).  Bleeding that is heavier or more than normal.  Periods that last longer than usual.  Bleeding after menopause. There are many problems that may cause this. Treatment will depend on the cause of the bleeding. Any kind of bleeding that is not normal should be reviewed by your doctor.  HOME CARE Watch your condition for any changes. These actions may lessen any discomfort you are having:  Do not use tampons or douches as told by your doctor.  Change your pads often. You should get regular pelvic exams and Pap tests. Keep all appointments for tests as told by your doctor. GET HELP IF:  You are bleeding for more than 1 week.  You feel dizzy at times. GET HELP RIGHT AWAY IF:   You pass out.  You have to change pads every 15 to 30 minutes.  You have belly pain.  You have a fever.  You become sweaty or weak.  You are passing large blood clots from the vagina.  You feel sick to your stomach (nauseous) and throw up (vomit). MAKE SURE YOU:  Understand these instructions.  Will watch your condition.  Will get help right away if you are not doing well or get worse. Document Released: 07/02/2009 Document Revised: 06/25/2013 Document Reviewed: 04/03/2013 [employer] Patient Information 2014 [employer].  

## 2014-03-03 NOTE — MAU Provider Note (Signed)
History     CSN: [id number]  Arrival date and time: 03/03/14 1625   First Provider Initiated Contact with Patient 03/03/14 1843      Chief Complaint  Patient presents with  . Vaginal Bleeding  . Abdominal Cramping  . Possible Pregnancy   Vaginal Bleeding Associated symptoms include abdominal pain (cramping). Pertinent negatives include no dysuria, frequency, nausea, urgency or vomiting.  Abdominal Cramping Pertinent negatives include no dysuria, frequency, nausea or vomiting.  Possible Pregnancy Associated symptoms include abdominal pain (cramping). Pertinent negatives include no nausea or vomiting.    Pt is a .33 yo a here with report of a llast Depo was in July 2014. Her first period after that was 4-22 thru 4-26 with spotting until 4-30. States she started bleeding again on 5-29 till 6-5. Started spotting again on 6-14 and heavy today with severe abdominal cramping. States she had 2 pregnancy tests in May because her period was late and one was positive and one negative. Thought she had miscarried.  Also reports nausea with occasional vomiting.   Past Medical History  Diagnosis Date  . Migraine   . BV (bacterial vaginosis)   . Abnormal Pap smear     Past Surgical History  Procedure Laterality Date  . Cesarean section    . Dental surgery      Family History  Problem Relation Age of Onset  . Hypertension Mother   . Stroke Mother   . Cancer Father   . Hypertension Father   . Migraines Father     History  Substance Use Topics  . Smoking status: Never Smoker   . Smokeless tobacco: Never Used  . Alcohol Use: Yes    Allergies: No Known Allergies  Prescriptions prior to admission  Medication Sig Dispense Refill  . amitriptyline (ELAVIL) 50 MG tablet Take 50 mg by mouth at bedtime.      Marland Kitchen. omeprazole (PRILOSEC) 20 MG capsule Take 20 mg by mouth daily.      . Prenatal Vit-Fe Fumarate-FA (PRENATAL MULTIVITAMIN) TABS tablet Take 1 tablet by mouth daily at 12 noon.       . [DISCONTINUED] omeprazole (PRILOSEC) 20 MG capsule Take one capsule PO twice a day for 3 days, then one capsule PO once a day.  30 capsule  0    Review of Systems  Constitutional: Positive for malaise/fatigue (two days ago).  Gastrointestinal: Positive for abdominal pain (cramping). Negative for nausea and vomiting.  Genitourinary: Positive for vaginal bleeding. Negative for dysuria, urgency and frequency.       Vaginal bleeding  All other systems reviewed and are negative.  Physical Exam   Blood pressure 111/63, pulse 81, temperature 98.9 F (37.2 C), temperature source Oral, resp. rate 16, height 5\' 2"  (1.575 m), weight 53.887 kg (118 lb 12.8 oz), last menstrual period 02/13/2014, SpO2 99.00%.  Physical Exam  Constitutional: She is oriented to person, place, and time. She appears well-developed and well-nourished. No distress.  HENT:  Head: Normocephalic.  Neck: Normal range of motion. Neck supple.  Cardiovascular: Normal rate, regular rhythm and normal heart sounds.   Respiratory: Effort normal and breath sounds normal.  GI: Soft. She exhibits no mass. There is no guarding.  Genitourinary: There is bleeding (negative clots) around the vagina.  Musculoskeletal: Normal range of motion. She exhibits no edema.  Neurological: She is alert and oriented to person, place, and time. She has normal reflexes.  Skin: Skin is warm and dry.    MAU Course  Procedures Results  for orders placed during the hospital encounter of 03/03/14 (from the past 24 hour(s))  URINALYSIS, ROUTINE W REFLEX MICROSCOPIC     Status: Abnormal   Collection Time    03/03/14  4:50 PM      Result Value Ref Range   Color, Urine AMBER (*) YELLOW   APPearance CLOUDY (*) CLEAR   Specific Gravity, Urine 1.010  1.005 - 1.030   pH 7.0  5.0 - 8.0   Glucose, UA NEGATIVE  NEGATIVE mg/dL   Hgb urine dipstick LARGE (*) NEGATIVE   Bilirubin Urine SMALL (*) NEGATIVE   Ketones, ur 15 (*) NEGATIVE mg/dL   Protein, ur  30 (*) NEGATIVE mg/dL   Urobilinogen, UA 1.0  0.0 - 1.0 mg/dL   Nitrite NEGATIVE  NEGATIVE   Leukocytes, UA NEGATIVE  NEGATIVE  URINE MICROSCOPIC-ADD ON     Status: Abnormal   Collection Time    03/03/14  4:50 PM      Result Value Ref Range   Squamous Epithelial / LPF MANY (*) RARE   Urine-Other FIELD OBSCURED BY RBC'S    POCT PREGNANCY, URINE     Status: None   Collection Time    03/03/14  5:19 PM      Result Value Ref Range   Preg Test, Ur NEGATIVE  NEGATIVE  HCG, QUANTITATIVE, PREGNANCY     Status: None   Collection Time    03/03/14  6:10 PM      Result Value Ref Range   hCG, Beta Chain, Quant, S <1  <5 mIU/mL  CBC     Status: None   Collection Time    03/03/14  6:10 PM      Result Value Ref Range   WBC 7.0  4.0 - 10.5 K/uL   RBC 4.12  3.87 - 5.11 MIL/uL   Hemoglobin 12.7  12.0 - 15.0 g/dL   HCT 16.138.0  09.636.0 - 04.546.0 %   MCV 92.2  78.0 - 100.0 fL   MCH 30.8  26.0 - 34.0 pg   MCHC 33.4  30.0 - 36.0 g/dL   RDW 40.911.8  81.111.5 - 91.415.5 %   Platelets 181  150 - 400 K/uL  WET PREP, GENITAL     Status: Abnormal   Collection Time    03/03/14  7:03 PM      Result Value Ref Range   Yeast Wet Prep HPF POC FEW (*) NONE SEEN   Trich, Wet Prep NONE SEEN  NONE SEEN   Clue Cells Wet Prep HPF POC NONE SEEN  NONE SEEN   WBC, Wet Prep HPF POC NONE SEEN  NONE SEEN     Assessment and Plan  Vaginal Bleeding - likely menses  Plan: Discharge to home Provided reassurance Follow-up as needed  [facility] LLCMUHAMMAD,WALIDAH 03/03/2014, 6:45 PM

## 2014-03-04 LAB — GC/CHLAMYDIA PROBE AMP
CT Probe RNA: NEGATIVE
GC Probe RNA: NEGATIVE

## 2014-03-06 NOTE — MAU Provider Note (Signed)

## 2014-07-20 ENCOUNTER — Encounter (HOSPITAL_COMMUNITY): Payer: Self-pay | Admitting: *Deleted

## 2015-03-26 ENCOUNTER — Emergency Department (HOSPITAL_COMMUNITY)
Admission: EM | Admit: 2015-03-26 | Discharge: 2015-03-26 | Disposition: A | Discharge status: Home / Self Care | Payer: BLUE CROSS/BLUE SHIELD | Attending: Emergency Medicine | Admitting: Emergency Medicine

## 2015-03-26 ENCOUNTER — Encounter (HOSPITAL_COMMUNITY): Payer: Self-pay

## 2015-03-26 DIAGNOSIS — S0990XA Unspecified injury of head, initial encounter: Secondary | ICD-10-CM | POA: Insufficient documentation

## 2015-03-26 DIAGNOSIS — Y9289 Other specified places as the place of occurrence of the external cause: Secondary | ICD-10-CM | POA: Diagnosis not present

## 2015-03-26 DIAGNOSIS — M25561 Pain in right knee: Secondary | ICD-10-CM | POA: Diagnosis not present

## 2015-03-26 DIAGNOSIS — Z8742 Personal history of other diseases of the female genital tract: Secondary | ICD-10-CM | POA: Diagnosis not present

## 2015-03-26 DIAGNOSIS — R519 Headache, unspecified: Secondary | ICD-10-CM

## 2015-03-26 DIAGNOSIS — Y998 Other external cause status: Secondary | ICD-10-CM | POA: Diagnosis not present

## 2015-03-26 DIAGNOSIS — Y9389 Activity, other specified: Secondary | ICD-10-CM | POA: Insufficient documentation

## 2015-03-26 DIAGNOSIS — W01198A Fall on same level from slipping, tripping and stumbling with subsequent striking against other object, initial encounter: Secondary | ICD-10-CM | POA: Insufficient documentation

## 2015-03-26 DIAGNOSIS — G43909 Migraine, unspecified, not intractable, without status migrainosus: Secondary | ICD-10-CM | POA: Diagnosis not present

## 2015-03-26 DIAGNOSIS — Z8719 Personal history of other diseases of the digestive system: Secondary | ICD-10-CM | POA: Insufficient documentation

## 2015-03-26 DIAGNOSIS — Z79899 Other long term (current) drug therapy: Secondary | ICD-10-CM | POA: Diagnosis not present

## 2015-03-26 DIAGNOSIS — R51 Headache: Secondary | ICD-10-CM

## 2015-03-26 MED ORDER — HYDROCODONE-ACETAMINOPHEN 5-325 MG PO TABS: 2.0000 | ORAL_TABLET | ORAL | Status: DC | PRN

## 2015-03-26 MED ORDER — METHOCARBAMOL 500 MG PO TABS: 500.0000 mg | ORAL_TABLET | Freq: Two times a day (BID) | ORAL | Status: DC | PRN

## 2015-03-26 MED ORDER — DIAZEPAM 5 MG PO TABS: 5.0000 mg | ORAL_TABLET | Freq: Two times a day (BID) | ORAL | Status: DC

## 2015-03-26 MED ORDER — METOCLOPRAMIDE HCL 10 MG PO TABS
10.0000 mg | ORAL_TABLET | Freq: Once | ORAL | Status: AC
  Administered 2015-03-26: 10 mg via ORAL
  Filled 2015-03-26: qty 1

## 2015-03-26 MED ORDER — KETOROLAC TROMETHAMINE 60 MG/2ML IM SOLN
60.0000 mg | Freq: Once | INTRAMUSCULAR | Status: AC
  Administered 2015-03-26: 60 mg via INTRAMUSCULAR
  Filled 2015-03-26: qty 2

## 2015-03-26 MED ORDER — DIPHENHYDRAMINE HCL 25 MG PO CAPS
25.0000 mg | ORAL_CAPSULE | Freq: Once | ORAL | Status: AC
  Administered 2015-03-26: 25 mg via ORAL
  Filled 2015-03-26: qty 1

## 2015-03-26 NOTE — Discharge Instructions (Signed)
Knee Bracing Knee braces are supports to help stabilize and protect an injured or painful knee. They come in many different styles. They should support and protect the knee without increasing the chance of other injuries to yourself or others. It is important not to have a false sense of security when using a brace. Knee braces that help you to keep using your knee:  Do not restore normal knee stability under high stress forces.  May decrease some aspects of athletic performance. Some of the different types of knee braces are:  Prophylactic knee braces are designed to prevent or reduce the severity of knee injuries during sports that make injury to the knee more likely.  Rehabilitative knee braces are designed to allow protected motion of:  Injured knees.  Knees that have been treated with or without surgery. There is no evidence that the use of a supportive knee brace protects the graft following a successful anterior cruciate ligament (ACL) reconstruction. However, braces are sometimes used to:   Protect injured ligaments.  Control knee movement during the initial healing period. They may be used as part of the treatment program for the various injured ligaments or cartilage of the knee including the:  Anterior cruciate ligament.  Medial collateral ligament.  Medial or lateral cartilage (meniscus).  Posterior cruciate ligament.  Lateral collateral ligament. Rehabilitative knee braces are most commonly used:  During crutch-assisted walking right after injury.  During crutch-assisted walking right after surgery to repair the cartilage and/or cruciate ligament injury.  For a short period of time, 2-8 weeks, after the injury or surgery. The value of a rehabilitative brace as opposed to a cast or splint includes the:  Ability to adjust the brace for swelling.  Ability to remove the brace for examinations, icing, or showering.  Ability to allow for movement in a controlled  range of motion. Functional knee braces give support to knees that have already been injured. They are designed to provide stability for the injured knee and provide protection after repair. Functional knee braces may not affect performance much. Lower extremity muscle strengthening, flexibility, and improvement in technique are more important than bracing in treating ligamentous knee injuries. Functional braces are not a substitute for rehabilitation or surgical procedures. Unloader/off-loader braces are designed to provide pain relief in arthritic knees. Patients with wear and tear arthritis from growing old or from an old cartilage injury (osteoarthritis) of the knee, and bowlegged (varus) or knock-knee (valgus) deformities, often develop increased pain in the arthritic side due to increased loading. Unloader/off-loader braces are made to reduce uneven loading in such knees. There is reduction in bowing out movement in bowlegged knees when the correct unloader brace is used. Patients with advanced osteoarthritis or severe varus or valgus alignment problems would not likely benefit from bracing. Patellofemoral braces help the kneecap to move smoothly and well centered over the end of the femur in the knee.  Most people who wear knee braces feel that they help. However, there is a lack of scientific evidence that knee braces are helpful at the level needed for athletic participation to prevent injury. In spite of this, athletes report an increase in knee stability, pain relief, performance improvement, and confidence during athletics when using a brace.  Different knee problems require different knee braces:  Your caregiver may suggest one kind of knee brace after knee surgery.  A caregiver may choose another kind of knee brace for support instead of surgery for some types of torn ligaments.  You may  also need one for pain in the front of your knee that is not getting better with strengthening and  flexibility exercises. Get your caregiver's advice if you want to try a knee brace. The caregiver will advise you on where to get them and provide a prescription when it is needed to fashion and/or fit the brace. Knee braces are the least important part of preventing knee injuries or getting better following injury. Stretching, strengthening and technique improvement are far more important in caring for and preventing knee injuries. When strengthening your knee, increase your activities a little at a time so as not to develop injuries from overuse. Work out an exercise plan with your caregiver and/or physical therapist to get the best program for you. Do not let a knee brace become a crutch. Always remember, there are no braces which support the knee as well as your original ligaments and cartilage you were born with. Conditioning, proper warm-up, and stretching remain the most important parts of keeping your knees healthy. HOW TO USE A KNEE BRACE  During sports, knee braces should be used as directed by your caregiver.  Make sure that the hinges are where the knee bends.  Straps, tapes, or hook-and-loop tapes should be fastened around your leg as instructed.  You should check the placement of the brace during activities to make sure that it has not moved. Poorly positioned braces can hurt rather than help you.  To work well, a knee brace should be worn during all activities that put you at risk of knee injury.  Warm up properly before beginning athletic activities. HOME CARE INSTRUCTIONS  Knee braces often get damaged during normal use. Replace worn-out braces for maximum benefit.  Clean regularly with soap and water.  Inspect your brace often for wear and tear.  Cover exposed metal to protect others from injury.  Durable materials may cost more, but last longer. SEEK IMMEDIATE MEDICAL CARE IF:   Your knee seems to be getting worse rather than better.  You have increasing pain or  swelling in the knee.  You have problems caused by the knee brace.  You have increased swelling or inflammation (redness or soreness) in your knee.  Your knee becomes warm and more painful and you develop an unexplained temperature over 101F (38.3C). MAKE SURE YOU:   Understand these instructions.  Will watch your condition.  Will get help right away if you are not doing well or get worse. See your caregiver, physical therapist, or orthopedic surgeon for additional information. Document Released: 11/25/2003 Document Revised: 01/19/2014 Document Reviewed: 03/03/2009 [facility] Patient Information 2015 [place]. This information is not intended to replace advice given to you by your health care provider. Make sure you discuss any questions you have with your health care provider.  Musculoskeletal Pain Musculoskeletal pain is muscle and boney aches and pains. These pains can occur in any part of the body. Your caregiver may treat you without knowing the cause of the pain. They may treat you if blood or urine tests, X-rays, and other tests were normal.  CAUSES There is often not a definite cause or reason for these pains. These pains may be caused by a type of germ (virus). The discomfort may also come from overuse. Overuse includes working out too hard when your body is not fit. Boney aches also come from weather changes. Bone is sensitive to atmospheric pressure changes. HOME CARE INSTRUCTIONS   Ask when your test results will be ready. Make sure you  get your test results.  Only take over-the-counter or prescription medicines for pain, discomfort, or fever as directed by your caregiver. If you were given medications for your condition, do not drive, operate machinery or power tools, or sign legal documents for 24 hours. Do not drink alcohol. Do not take sleeping pills or other medications that may interfere with treatment.  Continue all activities unless the activities cause more  pain. When the pain lessens, slowly resume normal activities. Gradually increase the intensity and duration of the activities or exercise.  During periods of severe pain, bed rest may be helpful. Lay or sit in any position that is comfortable.  Putting ice on the injured area.  Put ice in a bag.  Place a towel between your skin and the bag.  Leave the ice on for 15 to 20 minutes, 3 to 4 times a day.  Follow up with your caregiver for continued problems and no reason can be found for the pain. If the pain becomes worse or does not go away, it may be necessary to repeat tests or do additional testing. Your caregiver may need to look further for a possible cause. SEEK IMMEDIATE MEDICAL CARE IF:  You have pain that is getting worse and is not relieved by medications.  You develop chest pain that is associated with shortness or breath, sweating, feeling sick to your stomach (nauseous), or throw up (vomit).  Your pain becomes localized to the abdomen.  You develop any new symptoms that seem different or that concern you. MAKE SURE YOU:   Understand these instructions.  Will watch your condition.  Will get help right away if you are not doing well or get worse. Document Released: 09/04/2005 Document Revised: 11/27/2011 Document Reviewed: 05/09/2013 [facility] Patient Information 2015 [place], [place]. This information is not intended to replace advice given to you by your health care provider. Make sure you discuss any questions you have with your health care provider.  Migraine Headache A migraine headache is very bad, throbbing pain on one or both sides of your head. Talk to your doctor about what things may bring on (trigger) your migraine headaches. HOME CARE  Only take medicines as told by your doctor.  Lie down in a dark, quiet room when you have a migraine.  Keep a journal to find out if certain things bring on migraine headaches. For example, write down:  What you eat and  drink.  How much sleep you get.  Any change to your diet or medicines.  Lessen how much alcohol you drink.  Quit smoking if you smoke.  Get enough sleep.  Lessen any stress in your life.  Keep lights dim if bright lights bother you or make your migraines worse. GET HELP RIGHT AWAY IF:   Your migraine becomes really bad.  You have a fever.  You have a stiff neck.  You have trouble seeing.  Your muscles are weak, or you lose muscle control.  You lose your balance or have trouble walking.  You feel like you will pass out (faint), or you pass out.  You have really bad symptoms that are different than your first symptoms. MAKE SURE YOU:   Understand these instructions.  Will watch your condition.  Will get help right away if you are not doing well or get worse. Document Released: 06/13/2008 Document Revised: 11/27/2011 Document Reviewed: 05/12/2013 [facility] Patient Information 2015 [place], [place]. This information is not intended to replace advice given to you by your health care  provider. Make sure you discuss any questions you have with your health care provider.

## 2015-03-26 NOTE — ED Notes (Signed)
Pt presents with c/o fall with associated head injury and and right knee injury. Pt reports that she fell approx 5 days ago and hit her right knee on the concrete steps and the back of her head as well. Pt reports she felt like she "blacked out" for a second when she fell. Pt reports a headache at this time. NAD, ambulatory to triage.

## 2015-03-26 NOTE — ED Provider Notes (Signed)
CSN: [id number]     Arrival date & time 03/26/15  1414 History   This chart was scribed for non-physician practitioner, Danelle BerryLeisa Prue Lingenfelter, PA-C working with Jaci Carrelhristopher Pollina, MD by Arlan OrganAshley Leger, ED Scribe. This patient was seen in room WTR6/WTR6 and the patient's care was started at 3:21 PM.   Chief Complaint  Patient presents with  . Head Injury  . Knee Injury   The history is provided by the patient. No language interpreter was used.    HPI Comments: Christina [name] is a 34 y.o. female without any pertinent past medical history who presents to the Emergency Department here after a head injury sustained 5 days ago. Pt states she fell and hit her R knee and back of her head against the concrete steps. She thinks she "blacked out" for a little bit. No evaluation at time of fall. She now c/o constant, ongoing, worsening R knee pain, HA with muscle soreness in the back of her neck, worse on the left side. Knee pain is made worse with ambulation, weight bearing, and certain positioning. No alleviating factors at this time. She has a hx of migraines and her normal medicine for migraines has not improved her HA.out any improvement for symptoms. She denies fever, chills, nausea, vomiting. No numbness, tingling, weakness, visual changes,  No known allergies to medications.  Past Medical History  Diagnosis Date  . Migraine   . BV (bacterial vaginosis)   . Abnormal Pap smear    Past Surgical History  Procedure Laterality Date  . Cesarean section    . Dental surgery     Family History  Problem Relation Age of Onset  . Hypertension Mother   . Stroke Mother   . Cancer Father   . Hypertension Father   . Migraines Father    History  Substance Use Topics  . Smoking status: Never Smoker   . Smokeless tobacco: Never Used  . Alcohol Use: No   OB History    Gravida Para Term Preterm AB TAB SAB Ectopic Multiple Living   4 2 2  2  2   2      Review of Systems  Constitutional: Negative for fever  and chills.  Eyes: Negative for visual disturbance.  Respiratory: Negative for chest tightness and shortness of breath.   Cardiovascular: Negative for chest pain.  Gastrointestinal: Negative for nausea, vomiting and abdominal pain.  Genitourinary: Negative.   Skin: Negative.   Psychiatric/Behavioral: Negative for confusion.    Allergies  Review of patient's allergies indicates no known allergies.  Home Medications   Prior to Admission medications   Medication Sig Start Date End Date Taking? Authorizing Provider  amitriptyline (ELAVIL) 50 MG tablet Take 50 mg by mouth at bedtime.   Yes Historical Provider, MD  cyclobenzaprine (FLEXERIL) 10 MG tablet 1 TABLET BY MOUTH NIGHTLY AS NEEDED (PRN) FOR MUSCLE SPASMS 03/11/15  Yes Historical Provider, MD  omeprazole (PRILOSEC) 20 MG capsule Take 20 mg by mouth daily. 01/02/14  Yes [name], PA-C  propranolol (INDERAL) 40 MG tablet Take 40 mg by mouth 2 (two) times daily. 03/17/15  Yes Historical Provider, MD  RELPAX 20 MG tablet TAKE 1 TABLET BY MOUTH AT ONSET OF HEADACHE, REPEAT IN 2 HRS IF NO IMPROVEMENT 03/11/15  Yes Historical Provider, MD  sucralfate (CARAFATE) 1 G tablet 1 TABLET BY MOUTH FOUR TIMES A DAY 02/03/15  Yes Historical Provider, MD  traMADol (ULTRAM) 50 MG tablet Take 50 mg by mouth 2 (two) times daily as  needed. for pain 03/18/15   Historical Provider, MD   Triage Vitals: BP 100/56 mmHg  Pulse 107  Temp(Src) 98.5 F (36.9 C) (Oral)  Resp 16  SpO2 100%  LMP 03/19/2015   Physical Exam  Constitutional: She is oriented to person, place, and time. She appears well-developed and well-nourished. No distress.  HENT:  Head: Normocephalic and atraumatic.  Right Ear: External ear normal.  Left Ear: External ear normal.  Nose: Nose normal.  Mouth/Throat: Oropharynx is clear and moist. No oropharyngeal exudate.  Eyes: Conjunctivae, EOM and lids are normal. Pupils are equal, round, and reactive to light. Right eye exhibits no  discharge. Left eye exhibits no discharge. No scleral icterus.  Neck: Trachea normal, normal range of motion and phonation normal. Neck supple. No JVD present. Muscular tenderness present. No spinous process tenderness present. No rigidity. No tracheal deviation, no edema, no erythema and normal range of motion present.    Cardiovascular: Normal rate and regular rhythm.   Pulmonary/Chest: Effort normal and breath sounds normal. No stridor. No respiratory distress.  Abdominal: She exhibits no distension.  Musculoskeletal: Normal range of motion. She exhibits no edema.       Right knee: She exhibits normal range of motion, no swelling, no effusion, no deformity, no laceration, no erythema, normal alignment, no LCL laxity, normal patellar mobility, no bony tenderness, normal meniscus and no MCL laxity. No tenderness found.  Lymphadenopathy:    She has no cervical adenopathy.  Neurological: She is alert and oriented to person, place, and time. She has normal strength. She is not disoriented. No cranial nerve deficit or sensory deficit. She exhibits normal muscle tone. Coordination and gait normal.  CN 2-12 grossly intact   Skin: Skin is warm and dry. No rash noted. She is not diaphoretic. No erythema. No pallor.  Psychiatric: She has a normal mood and affect. Her behavior is normal. Judgment and thought content normal.  Nursing note and vitals reviewed.   ED Course  Procedures (including critical care time)  DIAGNOSTIC STUDIES: Oxygen Saturation is 100% on RA, Normal by my interpretation.    COORDINATION OF CARE: 3:25 PM-Discussed treatment plan with pt at bedside and pt agreed to plan.     Labs Review Labs Reviewed - No data to display  Imaging Review No results found.   EKG Interpretation None      MDM   Final diagnoses:  None   Pt with neck pain, knee pain, and HA, s/p a fall several days ago.  No focal neurological deficits, neck muscles tender on left side, suspect  tension HA in addition to normal migraine +/- post-concussion HA, although no N/V, no indication to get CT head Will treat muscle pain with toradol, give HA cocktail PO here in ED Knee - no sign of trauma or swelling - will give sleeve for comfort, pt given RICE tx instructions, will get her own NSAIDS, no need for crutches Neck pain - given rx for robaxin, 6 norco and 2 valium for her muscle spasm, suspect she will have HA for a while with migraine hx, trauma and muscle tightness.   Return precautions given, pt encouraged to f/u with/establish care with a pcp, pt verbalized understanding.  I personally performed the services described in this documentation, which was scribed in my presence. The recorded information has been reviewed and is accurate.   Danelle Berry, PA-C 04/01/15 1043  Gilda Crease, MD 04/03/15 [phone]

## 2015-09-29 ENCOUNTER — Emergency Department (HOSPITAL_COMMUNITY): Payer: BLUE CROSS/BLUE SHIELD

## 2015-09-29 ENCOUNTER — Encounter (HOSPITAL_COMMUNITY): Payer: Self-pay

## 2015-09-29 DIAGNOSIS — W1839XA Other fall on same level, initial encounter: Secondary | ICD-10-CM | POA: Insufficient documentation

## 2015-09-29 DIAGNOSIS — Y9389 Activity, other specified: Secondary | ICD-10-CM | POA: Diagnosis not present

## 2015-09-29 DIAGNOSIS — Y92003 Bedroom of unspecified non-institutional (private) residence as the place of occurrence of the external cause: Secondary | ICD-10-CM | POA: Insufficient documentation

## 2015-09-29 DIAGNOSIS — R079 Chest pain, unspecified: Secondary | ICD-10-CM | POA: Insufficient documentation

## 2015-09-29 DIAGNOSIS — S0990XA Unspecified injury of head, initial encounter: Secondary | ICD-10-CM | POA: Diagnosis not present

## 2015-09-29 DIAGNOSIS — Y998 Other external cause status: Secondary | ICD-10-CM | POA: Diagnosis not present

## 2015-09-29 LAB — BASIC METABOLIC PANEL
Anion gap: 11 (ref 5–15)
BUN: 7 mg/dL (ref 6–20)
CHLORIDE: 101 mmol/L (ref 101–111)
CO2: 27 mmol/L (ref 22–32)
CREATININE: 0.8 mg/dL (ref 0.44–1.00)
Calcium: 9.3 mg/dL (ref 8.9–10.3)
Glucose, Bld: 105 mg/dL — ABNORMAL HIGH (ref 65–99)
POTASSIUM: 2.9 mmol/L — AB (ref 3.5–5.1)
SODIUM: 139 mmol/L (ref 135–145)

## 2015-09-29 LAB — CBC
HCT: 38.3 % (ref 36.0–46.0)
Hemoglobin: 12.8 g/dL (ref 12.0–15.0)
MCH: 30.4 pg (ref 26.0–34.0)
MCHC: 33.4 g/dL (ref 30.0–36.0)
MCV: 91 fL (ref 78.0–100.0)
PLATELETS: 256 10*3/uL (ref 150–400)
RBC: 4.21 MIL/uL (ref 3.87–5.11)
RDW: 11.7 % (ref 11.5–15.5)
WBC: 8.2 10*3/uL (ref 4.0–10.5)

## 2015-09-29 LAB — I-STAT TROPONIN, ED: Troponin i, poc: 0 ng/mL (ref 0.00–0.08)

## 2015-09-29 NOTE — ED Notes (Signed)
Pt states she was getting out of the shower tonight and started having left sided cp. She states she has been under a lot of stress. Her mother and father passed away recently. One yesterday and one in December. States she also fell and hit her head going into her bedroom but not sure what made her fall.

## 2015-09-30 ENCOUNTER — Emergency Department (HOSPITAL_COMMUNITY)
Admission: EM | Admit: 2015-09-30 | Discharge: 2015-09-30 | Discharge status: Against Medical Advice | Payer: BLUE CROSS/BLUE SHIELD | Attending: Emergency Medicine | Admitting: Emergency Medicine

## 2015-09-30 NOTE — ED Notes (Signed)
Pt states that she cannot wait any longer. States that she has been under a lot of stress with loosing her parents recently. States that she will come back tomorrow. Apologized for length of wait.

## 2016-03-03 ENCOUNTER — Emergency Department (HOSPITAL_BASED_OUTPATIENT_CLINIC_OR_DEPARTMENT_OTHER)
Admission: EM | Admit: 2016-03-03 | Discharge: 2016-03-03 | Disposition: A | Discharge status: Home / Self Care | Payer: BLUE CROSS/BLUE SHIELD | Attending: Emergency Medicine | Admitting: Emergency Medicine

## 2016-03-03 ENCOUNTER — Encounter (HOSPITAL_BASED_OUTPATIENT_CLINIC_OR_DEPARTMENT_OTHER): Payer: Self-pay | Admitting: *Deleted

## 2016-03-03 ENCOUNTER — Emergency Department (HOSPITAL_BASED_OUTPATIENT_CLINIC_OR_DEPARTMENT_OTHER): Payer: BLUE CROSS/BLUE SHIELD

## 2016-03-03 DIAGNOSIS — Y929 Unspecified place or not applicable: Secondary | ICD-10-CM | POA: Insufficient documentation

## 2016-03-03 DIAGNOSIS — M62838 Other muscle spasm: Secondary | ICD-10-CM

## 2016-03-03 DIAGNOSIS — W109XXA Fall (on) (from) unspecified stairs and steps, initial encounter: Secondary | ICD-10-CM | POA: Insufficient documentation

## 2016-03-03 DIAGNOSIS — S93402A Sprain of unspecified ligament of left ankle, initial encounter: Secondary | ICD-10-CM | POA: Insufficient documentation

## 2016-03-03 DIAGNOSIS — Y939 Activity, unspecified: Secondary | ICD-10-CM | POA: Insufficient documentation

## 2016-03-03 DIAGNOSIS — Y999 Unspecified external cause status: Secondary | ICD-10-CM | POA: Insufficient documentation

## 2016-03-03 MED ORDER — IBUPROFEN 600 MG PO TABS: 600.0000 mg | ORAL_TABLET | Freq: Four times a day (QID) | ORAL | Status: DC | PRN
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
## 2016-03-03 NOTE — Discharge Instructions (Signed)
Ankle Sprain °An ankle sprain is an injury to the strong, fibrous tissues (ligaments) that hold the bones of your ankle joint together.  °CAUSES °An ankle sprain is usually caused by a fall or by twisting your ankle. Ankle sprains most commonly occur when you step on the outer edge of your foot, and your ankle turns inward. People who participate in sports are more prone to these types of injuries.  °SYMPTOMS  °· Pain in your ankle. The pain may be present at rest or only when you are trying to stand or walk. °· Swelling. °· Bruising. Bruising may develop immediately or within 1 to 2 days after your injury. °· Difficulty standing or walking, particularly when turning corners or changing directions. °DIAGNOSIS  °Your caregiver will ask you details about your injury and perform a physical exam of your ankle to determine if you have an ankle sprain. During the physical exam, your caregiver will press on and apply pressure to specific areas of your foot and ankle. Your caregiver will try to move your ankle in certain ways. An X-ray exam may be done to be sure a bone was not broken or a ligament did not separate from one of the bones in your ankle (avulsion fracture).  °TREATMENT  °Certain types of braces can help stabilize your ankle. Your caregiver can make a recommendation for this. Your caregiver may recommend the use of medicine for pain. If your sprain is severe, your caregiver may refer you to a surgeon who helps to restore function to parts of your skeletal system (orthopedist) or a physical therapist. °HOME CARE INSTRUCTIONS  °· Apply ice to your injury for 1-2 days or as directed by your caregiver. Applying ice helps to reduce inflammation and pain. °· Put ice in a plastic bag. °· Place a towel between your skin and the bag. °· Leave the ice on for 15-20 minutes at a time, every 2 hours while you are awake. °· Only take over-the-counter or prescription medicines for pain, discomfort, or fever as directed by  your caregiver. °· Elevate your injured ankle above the level of your heart as much as possible for 2-3 days. °· If your caregiver recommends crutches, use them as instructed. Gradually put weight on the affected ankle. Continue to use crutches or a cane until you can walk without feeling pain in your ankle. °· If you have a plaster splint, wear the splint as directed by your caregiver. Do not rest it on anything harder than a pillow for the first 24 hours. Do not put weight on it. Do not get it wet. You may take it off to take a shower or bath. °· You may have been given an elastic bandage to wear around your ankle to provide support. If the elastic bandage is too tight (you have numbness or tingling in your foot or your foot becomes cold and blue), adjust the bandage to make it comfortable. °· If you have an air splint, you may blow more air into it or let air out to make it more comfortable. You may take your splint off at night and before taking a shower or bath. Wiggle your toes in the splint several times per day to decrease swelling. °SEEK MEDICAL CARE IF:  °· You have rapidly increasing bruising or swelling. °· Your toes feel extremely cold or you lose feeling in your foot. °· Your pain is not relieved with medicine. °SEEK IMMEDIATE MEDICAL CARE IF: °· Your toes are numb or blue. °·   You have severe pain that is increasing. MAKE SURE YOU:   Understand these instructions.  Will watch your condition.  Will get help right away if you are not doing well or get worse.   This information is not intended to replace advice given to you by your health care provider. Make sure you discuss any questions you have with your health care provider.    Muscle Cramps and Spasms Muscle cramps and spasms occur when a muscle or muscles tighten and you have no control over this tightening (involuntary muscle contraction). They are a common problem and can develop in any muscle. The most common place is in the calf  muscles of the leg. Both muscle cramps and muscle spasms are involuntary muscle contractions, but they also have differences:   Muscle cramps are sporadic and painful. They may last a few seconds to a quarter of an hour. Muscle cramps are often more forceful and last longer than muscle spasms.  Muscle spasms may or may not be painful. They may also last just a few seconds or much longer. CAUSES  It is uncommon for cramps or spasms to be due to a serious underlying problem. In many cases, the cause of cramps or spasms is unknown. Some common causes are:   Overexertion.   Overuse from repetitive motions (doing the same thing over and over).   Remaining in a certain position for a long period of time.   Improper preparation, form, or technique while performing a sport or activity.   Dehydration.   Injury.   Side effects of some medicines.   Abnormally low levels of the salts and ions in your blood (electrolytes), especially potassium and calcium. This could happen if you are taking water pills (diuretics) or you are pregnant.  Some underlying medical problems can make it more likely to develop cramps or spasms. These include, but are not limited to:   Diabetes.   Parkinson disease.   Hormone disorders, such as thyroid problems.   Alcohol abuse.   Diseases specific to muscles, joints, and bones.   Blood vessel disease where not enough blood is getting to the muscles.  HOME CARE INSTRUCTIONS   Stay well hydrated. Drink enough water and fluids to keep your urine clear or pale yellow.  It may be helpful to massage, stretch, and relax the affected muscle.  For tight or tense muscles, use a warm towel, heating pad, or hot shower water directed to the affected area.  If you are sore or have pain after a cramp or spasm, applying ice to the affected area may relieve discomfort.  Put ice in a plastic bag.  Place a towel between your skin and the bag.  Leave the ice  on for 15-20 minutes, 03-04 times a day.  Medicines used to treat a known cause of cramps or spasms may help reduce their frequency or severity. Only take over-the-counter or prescription medicines as directed by your caregiver. SEEK MEDICAL CARE IF:  Your cramps or spasms get more severe, more frequent, or do not improve over time.  MAKE SURE YOU:   Understand these instructions.  Will watch your condition.  Will get help right away if you are not doing well or get worse.   This information is not intended to replace advice given to you by your health care provider. Make sure you discuss any questions you have with your health care provider.   Document Released: 02/24/2002 Document Revised: 12/30/2012 Document Reviewed: 08/21/2012 Elsevier Interactive  Patient Education 2016 [employer].

## 2016-03-03 NOTE — ED Notes (Signed)
She slipped and fell 2 days ago. Injury to her left ankle. Swelling.

## 2016-06-23 ENCOUNTER — Encounter (HOSPITAL_COMMUNITY): Payer: Self-pay | Admitting: Emergency Medicine

## 2016-06-23 ENCOUNTER — Ambulatory Visit (HOSPITAL_COMMUNITY)
Admission: EM | Admit: 2016-06-23 | Discharge: 2016-06-23 | Disposition: A | Discharge status: Home / Self Care | Payer: BLUE CROSS/BLUE SHIELD | Attending: Family Medicine | Admitting: Family Medicine

## 2016-06-23 DIAGNOSIS — M25572 Pain in left ankle and joints of left foot: Secondary | ICD-10-CM

## 2016-06-23 MED ORDER — MELOXICAM 7.5 MG PO TABS: 7.5000 mg | ORAL_TABLET | Freq: Two times a day (BID) | ORAL | 0 refills | Status: DC

## 2016-06-23 NOTE — ED Triage Notes (Signed)
Pt here for recurrent left foot pain onset 3-4 days... States she twisted her ankle 2 months ago  States she is a [employer] and is on her feet all day  States she saw an orthopedic and was told she needs an MRI but did not f/u   Denies inj/trauma  Steady gait... A&O x4... NAD

## 2016-06-23 NOTE — ED Provider Notes (Signed)
[facility]    CSN: [id number] Arrival date & time: 06/23/16  1926     History   Chief Complaint Chief Complaint  Patient presents with  . Foot Pain    HPI Christina [name] is a 35 y.o. female.   The history is provided by the patient.  Foot Pain  This is a recurrent problem. The current episode started more than 2 days ago (recurrent ankle pain, per ortho needs MRI, stands and walking all day on job.). The problem has not changed since onset.The symptoms are aggravated by walking.    Past Medical History:  Diagnosis Date  . Abnormal Pap smear   . BV (bacterial vaginosis)   . Migraine     Patient Active Problem List   Diagnosis Date Noted  . Migraine with aura 12/04/2012  . Cephalgia 12/04/2012    Past Surgical History:  Procedure Laterality Date  . CESAREAN SECTION    . DENTAL SURGERY      OB History    Gravida Para Term Preterm AB Living   4 2 2   2 2    SAB TAB Ectopic Multiple Live Births   2               Home Medications    Prior to Admission medications   Medication Sig Start Date End Date Taking? Authorizing Provider  amitriptyline (ELAVIL) 50 MG tablet Take 50 mg by mouth at bedtime.   Yes Historical Provider, MD  cyclobenzaprine (FLEXERIL) 10 MG tablet 1 TABLET BY MOUTH NIGHTLY AS NEEDED (PRN) FOR MUSCLE SPASMS 03/11/15   Historical Provider, MD  diazepam (VALIUM) 5 MG tablet Take 1 tablet (5 mg total) by mouth 2 (two) times daily. 03/26/15   [name], PA-C  HYDROcodone-acetaminophen (NORCO/VICODIN) 5-325 MG per tablet Take 2 tablets by mouth every 4 (four) hours as needed. 03/26/15   [name], PA-C  ibuprofen (ADVIL,MOTRIN) 600 MG tablet Take 1 tablet (600 mg total) by mouth every 6 (six) hours as needed. 03/03/16   [name], MD  methocarbamol (ROBAXIN) 500 MG tablet Take 1 tablet (500 mg total) by mouth 2 (two) times daily as needed for muscle spasms. 03/26/15   [name], PA-C  omeprazole (PRILOSEC) 20 MG capsule Take 20 mg by  mouth daily. 01/02/14   [name], PA-C  propranolol (INDERAL) 40 MG tablet Take 40 mg by mouth 2 (two) times daily. 03/17/15   Historical Provider, MD  RELPAX 20 MG tablet TAKE 1 TABLET BY MOUTH AT ONSET OF HEADACHE, REPEAT IN 2 HRS IF NO IMPROVEMENT 03/11/15   Historical Provider, MD  sucralfate (CARAFATE) 1 G tablet 1 TABLET BY MOUTH FOUR TIMES A DAY 02/03/15   Historical Provider, MD  traMADol (ULTRAM) 50 MG tablet Take 50 mg by mouth 2 (two) times daily as needed. for pain 03/18/15   Historical Provider, MD    Family History Family History  Problem Relation Age of Onset  . Hypertension Mother   . Stroke Mother   . Cancer Father   . Hypertension Father   . Migraines Father     Social History Social History  Substance Use Topics  . Smoking status: Never Smoker  . Smokeless tobacco: Never Used  . Alcohol use No     Allergies   Review of patient's allergies indicates no known allergies.   Review of Systems Review of Systems  Musculoskeletal: Positive for gait problem and joint swelling. Negative for arthralgias.  All other systems reviewed and  are negative.    Physical Exam Triage Vital Signs ED Triage Vitals  Enc Vitals Group     BP 06/23/16 1953 124/66     Pulse Rate 06/23/16 1953 99     Resp 06/23/16 1953 12     Temp 06/23/16 1953 98 F (36.7 C)     Temp Source 06/23/16 1953 Oral     SpO2 06/23/16 1953 100 %     Weight --      Height --      Head Circumference --      Peak Flow --      Pain Score 06/23/16 2004 5     Pain Loc --      Pain Edu? --      Excl. in GC? --    No data found.   Updated Vital Signs BP 124/66 (BP Location: Left Arm)   Pulse 99   Temp 98 F (36.7 C) (Oral)   Resp 12   SpO2 100%   Visual Acuity Right Eye Distance:   Left Eye Distance:   Bilateral Distance:    Right Eye Near:   Left Eye Near:    Bilateral Near:     Physical Exam  Constitutional: She is oriented to person, place, and time. She appears well-developed  and well-nourished. No distress.  Musculoskeletal: She exhibits tenderness. She exhibits no deformity.       Left ankle: She exhibits decreased range of motion and swelling. She exhibits no ecchymosis, no deformity and normal pulse. Tenderness. Lateral malleolus tenderness found. No head of 5th metatarsal and no proximal fibula tenderness found. Achilles tendon normal.  Neurological: She is alert and oriented to person, place, and time.  Skin: Skin is warm and dry.  Nursing note and vitals reviewed.    UC Treatments / Results  Labs (all labs ordered are listed, but only abnormal results are displayed) Labs Reviewed - No data to display  EKG  EKG Interpretation None       Radiology No results found.  Procedures Procedures (including critical care time)  Medications Ordered in UC Medications - No data to display   Initial Impression / Assessment and Plan / UC Course  I have reviewed the triage vital signs and the nursing notes.  Pertinent labs & imaging results that were available during my care of the patient were reviewed by me and considered in my medical decision making (see chart for details).  Clinical Course      Final Clinical Impressions(s) / UC Diagnoses   Final diagnoses:  None    New Prescriptions New Prescriptions   No medications on file     [name], MD 06/23/16 2011

## 2016-06-23 NOTE — Discharge Instructions (Signed)
Use brace. Crutches and medicine as needed, see your orthopedist for further evaluation.

## 2016-07-03 ENCOUNTER — Emergency Department (HOSPITAL_COMMUNITY): Payer: BLUE CROSS/BLUE SHIELD

## 2016-07-03 ENCOUNTER — Emergency Department (HOSPITAL_COMMUNITY)
Admission: EM | Admit: 2016-07-03 | Discharge: 2016-07-03 | Disposition: A | Discharge status: Home / Self Care | Payer: BLUE CROSS/BLUE SHIELD | Attending: Emergency Medicine | Admitting: Emergency Medicine

## 2016-07-03 ENCOUNTER — Encounter (HOSPITAL_COMMUNITY): Payer: Self-pay

## 2016-07-03 DIAGNOSIS — M25572 Pain in left ankle and joints of left foot: Secondary | ICD-10-CM | POA: Diagnosis not present

## 2016-07-03 NOTE — ED Triage Notes (Signed)
Pt off/on swelling to left ankle.  Works [employer] all day.  Injury over a year ago.  Pt states seen in urgent care on 10/6.  Pt states they did not do any xrays and feels needs further evaluation.  Pain goes up leg from ankle

## 2016-07-03 NOTE — ED Provider Notes (Signed)
[facility] Provider Note   CSN: [id number] Arrival date & time: 07/03/16  [id number]  By signing my name below, I, Placido SouLogan Joldersma, attest that this documentation has been prepared under the direction and in the presence of Bethel BornKelly Marie Ava Tangney, PA-C. Electronically Signed: Placido SouLogan Joldersma, ED Scribe. 07/03/16. 12:33 PM.  History   Chief Complaint Chief Complaint  Patient presents with  . Ankle Pain    HPI HPI Comments: Christina Boysancy A [name] is a 35 y.o. female who presents to the Emergency Department complaining of chronic, moderate, left ankle pain which worsened beginning a few days ago. Pt states she injured her ankle over one year ago and was dx with a "sprain". She was evaluated at UC 10 days ago and was given a brace but states that the brace does not fit her ankle. She reports associated, mild, left ankle swelling and instability. Her pain worsens with ambulation and movement. She takes hydrocodone and ibuprofen prn which provides mild short term relief. She has been evaluated by an orthopaedist one time for her ankle pain and states she was told she needs to have an MRI performed. Pt states she is going to f/u with her orthopaedist later today. She works in [employer] and stands and ambulates throughout the day. No numbness or other associated symptoms at this time.    The history is provided by the patient. No language interpreter was used.    Past Medical History:  Diagnosis Date  . Abnormal Pap smear   . BV (bacterial vaginosis)   . Migraine     Patient Active Problem List   Diagnosis Date Noted  . Migraine with aura 12/04/2012  . Cephalgia 12/04/2012    Past Surgical History:  Procedure Laterality Date  . CESAREAN SECTION    . DENTAL SURGERY      OB History    Gravida Para Term Preterm AB Living   4 2 2   2 2    SAB TAB Ectopic Multiple Live Births   2               Home Medications    Prior to Admission medications   Medication Sig Start Date End Date  Taking? Authorizing Provider  amitriptyline (ELAVIL) 50 MG tablet Take 50 mg by mouth at bedtime.    Historical Provider, MD  cyclobenzaprine (FLEXERIL) 10 MG tablet 1 TABLET BY MOUTH NIGHTLY AS NEEDED (PRN) FOR MUSCLE SPASMS 03/11/15   Historical Provider, MD  diazepam (VALIUM) 5 MG tablet Take 1 tablet (5 mg total) by mouth 2 (two) times daily. 03/26/15   [name], PA-C  HYDROcodone-acetaminophen (NORCO/VICODIN) 5-325 MG per tablet Take 2 tablets by mouth every 4 (four) hours as needed. 03/26/15   [name], PA-C  ibuprofen (ADVIL,MOTRIN) 600 MG tablet Take 1 tablet (600 mg total) by mouth every 6 (six) hours as needed. 03/03/16   [name], MD  meloxicam (MOBIC) 7.5 MG tablet Take 1 tablet (7.5 mg total) by mouth 2 (two) times daily after a meal. 06/23/16   Linna HoffJames D Kindl, MD  methocarbamol (ROBAXIN) 500 MG tablet Take 1 tablet (500 mg total) by mouth 2 (two) times daily as needed for muscle spasms. 03/26/15   [name], PA-C  omeprazole (PRILOSEC) 20 MG capsule Take 20 mg by mouth daily. 01/02/14   [name], PA-C  propranolol (INDERAL) 40 MG tablet Take 40 mg by mouth 2 (two) times daily. 03/17/15   Historical Provider, MD  RELPAX 20 MG tablet TAKE 1 TABLET BY  MOUTH AT ONSET OF HEADACHE, REPEAT IN 2 HRS IF NO IMPROVEMENT 03/11/15   Historical Provider, MD  sucralfate (CARAFATE) 1 G tablet 1 TABLET BY MOUTH FOUR TIMES A DAY 02/03/15   Historical Provider, MD  traMADol (ULTRAM) 50 MG tablet Take 50 mg by mouth 2 (two) times daily as needed. for pain 03/18/15   Historical Provider, MD    Family History Family History  Problem Relation Age of Onset  . Hypertension Mother   . Stroke Mother   . Cancer Father   . Hypertension Father   . Migraines Father     Social History Social History  Substance Use Topics  . Smoking status: Never Smoker  . Smokeless tobacco: Never Used  . Alcohol use No     Allergies   Review of patient's allergies indicates no known allergies.   Review of  Systems Review of Systems  Musculoskeletal: Positive for arthralgias and joint swelling.  Skin: Negative for color change and wound.  Neurological: Positive for weakness. Negative for numbness.   Physical Exam Updated Vital Signs BP 120/75 (BP Location: Left Arm)   Pulse 103   Temp 98.4 F (36.9 C) (Oral)   Resp 16   SpO2 100%   Physical Exam  Constitutional: She is oriented to person, place, and time. She appears well-developed and well-nourished.  HENT:  Head: Normocephalic and atraumatic.  Eyes: EOM are normal.  Neck: Normal range of motion.  Cardiovascular: Normal rate.   Pulmonary/Chest: Effort normal. No respiratory distress.  Abdominal: Soft.  Musculoskeletal: Normal range of motion.  Left ankle and foot: No obvious swelling or deformity. Tenderness to palpation over lateral ankle. FROM. N/V intact. Normal gait   Neurological: She is alert and oriented to person, place, and time.  Skin: Skin is warm and dry.  Psychiatric: She has a normal mood and affect.  Nursing note and vitals reviewed.  ED Treatments / Results  Labs (all labs ordered are listed, but only abnormal results are displayed) Labs Reviewed - No data to display  EKG  EKG Interpretation None       Radiology Dg Ankle Complete Left  Result Date: 07/03/2016 CLINICAL DATA:  Injury. EXAM: LEFT ANKLE COMPLETE - 3+ VIEW COMPARISON:  No recent prior. FINDINGS: Diffuse soft tissue swelling. No acute bony abnormality identified. No evidence of fracture or dislocation. IMPRESSION: No acute bony abnormality identified. No evidence of fracture or dislocation. Electronically Signed   By: Maisie Fus  Register   On: 07/03/2016 10:42    Procedures Procedures  DIAGNOSTIC STUDIES: Oxygen Saturation is 100% on RA, normal by my interpretation.    COORDINATION OF CARE: 12:32 PM Discussed next steps with pt. Pt verbalized understanding and is agreeable with the plan.    Medications Ordered in ED Medications - No  data to display   Initial Impression / Assessment and Plan / ED Course  I have reviewed the triage vital signs and the nursing notes.  Pertinent labs & imaging results that were available during my care of the patient were reviewed by me and considered in my medical decision making (see chart for details).  Clinical Course   Patient X-Ray negative for obvious fracture or dislocation.  Pt has been seen by [facility] for her ankle in the past and I urged to follow up once again. Patient given an ASO brace while in ED, conservative therapy recommended and discussed. Patient will be discharged home & is agreeable with above plan. Returns precautions discussed. Pt appears safe for  discharge.  I personally performed the services described in this documentation, which was scribed in my presence. The recorded information has been reviewed and is accurate.  Final Clinical Impressions(s) / ED Diagnoses   Final diagnoses:  Left ankle pain, unspecified chronicity    New Prescriptions New Prescriptions   No medications on file     Bethel Born, PA-C 07/04/16 2128    Rolland Porter, MD 07/17/16 2114

## 2016-07-03 NOTE — Discharge Instructions (Signed)
Rest, ice, compression, elevation

## 2016-07-03 NOTE — ED Notes (Signed)
Bed: WA28 Expected date:  Expected time:  Means of arrival:  Comments: 

## 2017-03-29 MED FILL — raNITIdine HCL 150 MG TABS: 150 | 30 days supply | Qty: 60 | Fill #0

## 2017-03-29 MED FILL — CLINDAMYCIN PH 1% GEL: 1 | 30 days supply | Qty: 30 | Fill #0

## 2017-04-12 ENCOUNTER — Encounter (HOSPITAL_COMMUNITY): Payer: Self-pay

## 2017-04-12 ENCOUNTER — Ambulatory Visit (HOSPITAL_COMMUNITY)
Admission: EM | Admit: 2017-04-12 | Discharge: 2017-04-12 | Disposition: A | Discharge status: Home / Self Care | Payer: 59 | Attending: Nurse Practitioner | Admitting: Nurse Practitioner

## 2017-04-12 DIAGNOSIS — N898 Other specified noninflammatory disorders of vagina: Secondary | ICD-10-CM | POA: Insufficient documentation

## 2017-04-12 DIAGNOSIS — Z113 Encounter for screening for infections with a predominantly sexual mode of transmission: Secondary | ICD-10-CM | POA: Diagnosis not present

## 2017-04-12 DIAGNOSIS — Z79899 Other long term (current) drug therapy: Secondary | ICD-10-CM | POA: Insufficient documentation

## 2017-04-12 DIAGNOSIS — Z823 Family history of stroke: Secondary | ICD-10-CM | POA: Diagnosis not present

## 2017-04-12 DIAGNOSIS — Z8249 Family history of ischemic heart disease and other diseases of the circulatory system: Secondary | ICD-10-CM | POA: Diagnosis not present

## 2017-04-12 LAB — POCT PREGNANCY, URINE: Preg Test, Ur: NEGATIVE

## 2017-04-12 MED ORDER — AZITHROMYCIN 250 MG PO TABS
ORAL_TABLET | ORAL | Status: AC
  Filled 2017-04-12: qty 4

## 2017-04-12 MED ORDER — CEFTRIAXONE SODIUM 250 MG IJ SOLR
250.0000 mg | Freq: Once | INTRAMUSCULAR | Status: AC
  Administered 2017-04-12: 250 mg via INTRAMUSCULAR

## 2017-04-12 MED ORDER — FLUCONAZOLE 150 MG PO TABS: 150.0000 mg | ORAL_TABLET | Freq: Once | ORAL | 0 refills | Status: AC

## 2017-04-12 MED ORDER — LIDOCAINE HCL (PF) 1 % IJ SOLN
INTRAMUSCULAR | Status: AC
  Filled 2017-04-12: qty 2

## 2017-04-12 MED ORDER — AZITHROMYCIN 250 MG PO TABS
1000.0000 mg | ORAL_TABLET | Freq: Once | ORAL | Status: AC
  Administered 2017-04-12: 1000 mg via ORAL

## 2017-04-12 MED ORDER — CEFTRIAXONE SODIUM 250 MG IJ SOLR
INTRAMUSCULAR | Status: AC
  Filled 2017-04-12: qty 250

## 2017-04-12 NOTE — ED Provider Notes (Signed)
CSN: [id number]     Arrival date & time 04/12/17  1622 History   First MD Initiated Contact with Patient 04/12/17 1713     Chief Complaint  Patient presents with  . Vaginal Discharge   (Consider location/radiation/quality/duration/timing/severity/associated sxs/prior Treatment)  Patient presents with a complaint of vaginal discharge. Onset of symptoms was 1 week ago and has been unchanged since that time. The patient describes the discharge as white and thick and does experience lower abdominal discomfort with the discharge. She does not complain of burning with urination. She denies genital lesions at this time. The patient does have a history of STD's. The patient is HIV negative. She is G3 P2. LMP unknown. Patient has not had a menstrual cycle this year as she was previously on Depo. Last injection was January 2018. She has a new sexual partner and has been using condoms for barrier protections; however, she reports that the condom did break during prior sexual encounter.         Past Medical History:  Diagnosis Date  . Abnormal Pap smear   . BV (bacterial vaginosis)   . Migraine    Past Surgical History:  Procedure Laterality Date  . CESAREAN SECTION    . DENTAL SURGERY     Family History  Problem Relation Age of Onset  . Hypertension Mother   . Stroke Mother   . Cancer Father   . Hypertension Father   . Migraines Father    Social History  Substance Use Topics  . Smoking status: Never Smoker  . Smokeless tobacco: Never Used  . Alcohol use No   OB History    Gravida Para Term Preterm AB Living   4 2 2   2 2    SAB TAB Ectopic Multiple Live Births   2             Review of Systems  Gastrointestinal: Negative for nausea and vomiting.  Genitourinary: Positive for vaginal discharge. Negative for dysuria, pelvic pain, vaginal bleeding and vaginal pain.  Musculoskeletal: Negative for back pain.  All other systems reviewed and are negative.   Allergies  Patient has  no known allergies.  Home Medications   Prior to Admission medications   Medication Sig Start Date End Date Taking? Authorizing Provider  amitriptyline (ELAVIL) 50 MG tablet Take 50 mg by mouth at bedtime.    [provider]  cyclobenzaprine (FLEXERIL) 10 MG tablet 1 TABLET BY MOUTH NIGHTLY AS NEEDED (PRN) FOR MUSCLE SPASMS 03/11/15   [provider]  diazepam (VALIUM) 5 MG tablet Take 1 tablet (5 mg total) by mouth 2 (two) times daily. 03/26/15   Danelle Berryapia, Leisa, PA-C  HYDROcodone-acetaminophen (NORCO/VICODIN) 5-325 MG per tablet Take 2 tablets by mouth every 4 (four) hours as needed. 03/26/15   Danelle Berryapia, Leisa, PA-C  ibuprofen (ADVIL,MOTRIN) 600 MG tablet Take 1 tablet (600 mg total) by mouth every 6 (six) hours as needed. 03/03/16   Arby BarrettePfeiffer, Marcy, MD  meloxicam (MOBIC) 7.5 MG tablet Take 1 tablet (7.5 mg total) by mouth 2 (two) times daily after a meal. 06/23/16   Kindl, Quita SkyeJames D, MD  methocarbamol (ROBAXIN) 500 MG tablet Take 1 tablet (500 mg total) by mouth 2 (two) times daily as needed for muscle spasms. 03/26/15   Danelle Berryapia, Leisa, PA-C  omeprazole (PRILOSEC) 20 MG capsule Take 20 mg by mouth daily. 01/02/14   Renne CriglerGeiple, Joshua, PA-C  propranolol (INDERAL) 40 MG tablet Take 40 mg by mouth 2 (two) times daily. 03/17/15  [provider]  RELPAX 20 MG tablet TAKE 1 TABLET BY MOUTH AT ONSET OF HEADACHE, REPEAT IN 2 HRS IF NO IMPROVEMENT 03/11/15   [provider]  sucralfate (CARAFATE) 1 G tablet 1 TABLET BY MOUTH FOUR TIMES A DAY 02/03/15   [provider]  traMADol (ULTRAM) 50 MG tablet Take 50 mg by mouth 2 (two) times daily as needed. for pain 03/18/15   [provider]   Meds Ordered and Administered this Visit   Medications  cefTRIAXone (ROCEPHIN) injection 250 mg (not administered)  azithromycin (ZITHROMAX) tablet 1,000 mg (not administered)    BP 122/77 (BP Location: Right Arm)   Pulse 99   Temp 98.5 F (36.9 C) (Oral)   Resp 15   Ht 5\' 3"  (1.6  m)   Wt 145 lb (65.8 kg)   SpO2 99%   BMI 25.69 kg/m  No data found.   Physical Exam  Constitutional: She is oriented to person, place, and time. She appears well-developed and well-nourished.  Cardiovascular: Normal rate and regular rhythm.   Pulmonary/Chest: Effort normal and breath sounds normal.  Genitourinary:  Genitourinary Comments: Female chaperone present. External genitalia without erythema, exudate or discharge. Vaginal vault is with minimal thin, white discharge. Cervix is of normal color and without any lesions. The cervical os is closed. No bleeding noted. Uterus is noted to be of normal size and nontender. No cervical motion tenderness. No palpable masses. The adnexa are without any massess or tenderness  Musculoskeletal: Normal range of motion.  Neurological: She is alert and oriented to person, place, and time.  Skin: Skin is warm and dry.  Psychiatric: She has a normal mood and affect.    Urgent Care Course     Procedures (including critical care time)  Labs Review Labs Reviewed  POCT PREGNANCY, URINE  CERVICOVAGINAL ANCILLARY ONLY    Imaging Review No results found.   Visual Acuity Review  Right Eye Distance:   Left Eye Distance:   Bilateral Distance:    Right Eye Near:   Left Eye Near:    Bilateral Near:         MDM   1. Vaginal discharge     Cervicovaginal culture pending. Patient requesting prophylactic treatment for STDs. Will administer rocephin and azithromycin in the clinic. We will call her with the results of the other tests. She also requests Diflucan for possible yeast infection which we will provide. Urine pregnancy test was negative.   Discussed diagnosis and treatment with patient. All questions have been answered and all concerns have been addressed. The patient verbalized understanding and had no further questions      Lurline IdolMurrill, Han Vejar, [place]NP 04/12/17 1732

## 2017-04-12 NOTE — ED Triage Notes (Signed)
Pt. Here for abnormal vaginal discharge x1 week. Pt. Describes it as white and thick with vaginal itching. Pt. Also would like to be tested for STI because she may have been exposed.

## 2017-04-13 LAB — CERVICOVAGINAL ANCILLARY ONLY
BACTERIAL VAGINITIS: NEGATIVE
CANDIDA VAGINITIS: POSITIVE — AB
CHLAMYDIA, DNA PROBE: NEGATIVE
Neisseria Gonorrhea: NEGATIVE
Trichomonas: NEGATIVE

## 2017-04-26 MED FILL — AMITRIPTYLINE HCL 75 MG TAB: 75 | 30 days supply | Qty: 30 | Fill #0

## 2017-04-26 MED FILL — raNITIdine HCL 150 MG TABS: 150 | 30 days supply | Qty: 60 | Fill #1

## 2017-05-22 MED FILL — AMITRIPTYLINE HCL 75 MG TAB: 75 | 30 days supply | Qty: 30 | Fill #0

## 2017-05-29 ENCOUNTER — Ambulatory Visit (HOSPITAL_COMMUNITY)
Admission: EM | Admit: 2017-05-29 | Discharge: 2017-05-29 | Disposition: A | Discharge status: Home / Self Care | Payer: 59 | Attending: Emergency Medicine | Admitting: Emergency Medicine

## 2017-05-29 ENCOUNTER — Encounter (HOSPITAL_COMMUNITY): Payer: Self-pay | Admitting: Emergency Medicine

## 2017-05-29 DIAGNOSIS — Z79899 Other long term (current) drug therapy: Secondary | ICD-10-CM | POA: Insufficient documentation

## 2017-05-29 DIAGNOSIS — N898 Other specified noninflammatory disorders of vagina: Secondary | ICD-10-CM | POA: Diagnosis present

## 2017-05-29 DIAGNOSIS — N760 Acute vaginitis: Secondary | ICD-10-CM | POA: Diagnosis not present

## 2017-05-29 LAB — POCT URINALYSIS DIP (DEVICE)
Bilirubin Urine: NEGATIVE
Glucose, UA: NEGATIVE mg/dL
HGB URINE DIPSTICK: NEGATIVE
Ketones, ur: NEGATIVE mg/dL
Leukocytes, UA: NEGATIVE
Nitrite: NEGATIVE
PROTEIN: NEGATIVE mg/dL
UROBILINOGEN UA: 0.2 mg/dL (ref 0.0–1.0)
pH: 6.5 (ref 5.0–8.0)

## 2017-05-29 LAB — POCT PREGNANCY, URINE: Preg Test, Ur: NEGATIVE

## 2017-05-29 MED ORDER — METRONIDAZOLE 500 MG PO TABS: 500.0000 mg | ORAL_TABLET | Freq: Two times a day (BID) | ORAL | 0 refills | Status: AC

## 2017-05-29 MED ORDER — FLUCONAZOLE 150 MG PO TABS: 150.0000 mg | ORAL_TABLET | Freq: Once | ORAL | 1 refills | Status: AC

## 2017-05-29 MED FILL — metroNIDAZOLE 500 MG TABS: 500 | 7 days supply | Qty: 14 | Fill #0

## 2017-05-29 MED FILL — FLUCONAZOLE 150 MG TABLET: 150 | 7 days supply | Qty: 6 | Fill #0

## 2017-05-29 NOTE — Discharge Instructions (Signed)
Take the medication as written. Give us a working phone number so that we can contact you if needed. Refrain from sexual contact until you know your results and your partner is treated if necessary.   Go to www.goodrx.com to look up your medications. This will give you a list of where you can find your prescriptions at the most affordable prices. Or ask the pharmacist what the cash price is, or if they have any other discount programs available to help make your medication more affordable. This can be less expensive than what you would pay with insurance.

## 2017-05-29 NOTE — ED Provider Notes (Signed)
HPI  SUBJECTIVE:  Christina [name] is a 36 y.o. female who presents with  5 days oderous vaginal discharge, vaginal irritation and itching. No Nausea, vomiting, abdominal, back, pelvic pain. No dysuria, urgency, frequency, cloudy or oderous urine, hematuria,  genital blisters.. No aggravating, alleviating factors. Has tried OTC yeast cream without improvement.. No dyspareunia . + recent abx use- states that she finished a Z-Pak last week for URI and that her symptoms started shortly thereafter. No perfumed soaps / bodywashes. Pt sexually active with relatively new female partner of 2 months who is asymptomatic. Patient will like to be checked for all STDs including HIV, syphilis, urine pregnancy. No history of gonorrhea, chlamydia, HIV, HSV, syphilis, Trichomonas, diabetes, hypertension, PID, ectopic pregnancy. She has a history of BV, yeast infections. LMP: January. She is on Depo-Provera shots, but has not had a shot since January. Would Like pregnancy test as well. AVW:[id number], Reggie Pile, FNP  Pt seen here on 7/26 for vaginal discharge. Wet prep positive for BV, negative for BV, Trichomonas. Gonorrhea Chlamydia were negative.  Past Medical History:  Diagnosis Date  . Abnormal Pap smear   . BV (bacterial vaginosis)   . Migraine     Past Surgical History:  Procedure Laterality Date  . CESAREAN SECTION    . DENTAL SURGERY      Family History  Problem Relation Age of Onset  . Hypertension Mother   . Stroke Mother   . Cancer Father   . Hypertension Father   . Migraines Father     Social History  Substance Use Topics  . Smoking status: Never Smoker  . Smokeless tobacco: Never Used  . Alcohol use No    No current facility-administered medications for this encounter.   Current Outpatient Prescriptions:  .  amitriptyline (ELAVIL) 50 MG tablet, Take 50 mg by mouth at bedtime., Disp: , Rfl:  .  fluconazole (DIFLUCAN) 150 MG tablet, Take 1 tablet (150 mg total) by mouth once. Two tabs  by mouth on day #1, 2 tabs by mouth on day #3, 2 tabs by mouth on day #6., Disp: 6 tablet, Rfl: 1 .  metroNIDAZOLE (FLAGYL) 500 MG tablet, Take 1 tablet (500 mg total) by mouth 2 (two) times daily., Disp: 14 tablet, Rfl: 0 .  omeprazole (PRILOSEC) 20 MG capsule, Take 20 mg by mouth daily., Disp: , Rfl:  .  propranolol (INDERAL) 40 MG tablet, Take 40 mg by mouth 2 (two) times daily., Disp: , Rfl: 0 .  RELPAX 20 MG tablet, TAKE 1 TABLET BY MOUTH AT ONSET OF HEADACHE, REPEAT IN 2 HRS IF NO IMPROVEMENT, Disp: , Rfl: 5 .  sucralfate (CARAFATE) 1 G tablet, 1 TABLET BY MOUTH FOUR TIMES A DAY, Disp: , Rfl: 5  No Known Allergies   ROS  As noted in HPI.   Physical Exam  BP 116/71   Pulse (!) 107   Temp 98.7 F (37.1 C) (Oral)   Resp 16   Wt 145 lb (65.8 kg)   SpO2 100%   BMI 25.69 kg/m   Constitutional: Well developed, well nourished, no acute distress Eyes:  EOMI, conjunctiva normal bilaterally HENT: Normocephalic, atraumatic,mucus membranes moist Respiratory: Normal inspiratory effort Cardiovascular: Normal rate GI: nondistended soft, nontender. No suprapubic tenderness  GU: External genitalia normal.  Normal vaginal mucosa.  Normal os.  thick nonoderous  white vaginal discharge. Uterus smooth, NT. No  CMT. No  adnexal tenderness. No adnexal masses.  Chaperone present during exam skin: No rash, skin intact  Musculoskeletal: no deformities Neurologic: Alert & oriented x 3, no focal neuro deficits Psychiatric: Speech and behavior appropriate   ED Course   Medications - No data to display  Orders Placed This Encounter  Procedures  . Pelvic exam    Standing Status:   Standing    Number of Occurrences:   1  . HIV antibody    Standing Status:   Standing    Number of Occurrences:   1  . RPR    Standing Status:   Standing    Number of Occurrences:   1  . POCT urinalysis dip (device)    Standing Status:   Standing    Number of Occurrences:   1  . Pregnancy, urine POC     Standing Status:   Standing    Number of Occurrences:   1    Results for orders placed or performed during the hospital encounter of 05/29/17 (from the past 24 hour(s))  POCT urinalysis dip (device)     Status: None   Collection Time: 05/29/17 10:36 AM  Result Value Ref Range   Glucose, UA NEGATIVE NEGATIVE mg/dL   Bilirubin Urine NEGATIVE NEGATIVE   Ketones, ur NEGATIVE NEGATIVE mg/dL   Specific Gravity, Urine <=1.005 1.005 - 1.030   Hgb urine dipstick NEGATIVE NEGATIVE   pH 6.5 5.0 - 8.0   Protein, ur NEGATIVE NEGATIVE mg/dL   Urobilinogen, UA 0.2 0.0 - 1.0 mg/dL   Nitrite NEGATIVE NEGATIVE   Leukocytes, UA NEGATIVE NEGATIVE  Pregnancy, urine POC     Status: None   Collection Time: 05/29/17 10:42 AM  Result Value Ref Range   Preg Test, Ur NEGATIVE NEGATIVE   No results found.  ED Clinical Impression  Vaginitis and vulvovaginitis   ED Assessment/Plan  Urine pregnancy negative. UA negative.  H&P most c/w yeast infection vs BV. Sent off GC/chlamydia, wet prep, HIV, RPR. Will not treat empirically now. Will send home with flagyl for possible BV and diflucan for yeast infection. States that she will await wet prep results prior to starting the Flagyl. Advised starting Diflucan today Patient states that she requires to Diflucan on day #1, 2 Diflucan on day #3 and 2 Diflucan on day #6. States that her normal regimen does not work for her. Advised pt to refrain from sexual contact until she  knows lab results, symptoms resolve, and partner(s) are treated if necessary. Pt provided working phone number. Follow-up with PMD as needed. Discussed labs, MDM, plan and followup with patient. Pt agrees with plan.   Meds ordered this encounter  Medications  . fluconazole (DIFLUCAN) 150 MG tablet    Sig: Take 1 tablet (150 mg total) by mouth once. Two tabs by mouth on day #1, 2 tabs by mouth on day #3, 2 tabs by mouth on day #6.    Dispense:  6 tablet    Refill:  1  . metroNIDAZOLE (FLAGYL)  500 MG tablet    Sig: Take 1 tablet (500 mg total) by mouth 2 (two) times daily.    Dispense:  14 tablet    Refill:  0    *This clinic note was created using Scientist, clinical (histocompatibility and immunogenetics)Dragon dictation software. Therefore, there may be occasional mistakes despite careful proofreading.  ?    Domenick GongMortenson, Karimah Winquist, MD 05/29/17 1048

## 2017-05-29 NOTE — ED Triage Notes (Signed)
PT reports she was seen here 1 month ago. PT was given one diflucan tablet. PT reports she typically takes 2 first day, 2 on day 3, and 2 on day 6. PT reports ongoing vaginal irritation ever since.

## 2017-05-30 LAB — CERVICOVAGINAL ANCILLARY ONLY
Bacterial vaginitis: NEGATIVE
CANDIDA VAGINITIS: NEGATIVE
Chlamydia: NEGATIVE
Neisseria Gonorrhea: NEGATIVE
TRICH (WINDOWPATH): NEGATIVE

## 2017-05-30 LAB — HIV ANTIBODY (ROUTINE TESTING W REFLEX): HIV SCREEN 4TH GENERATION: NONREACTIVE

## 2017-05-30 LAB — RPR: RPR Ser Ql: NONREACTIVE

## 2017-06-25 MED FILL — AMITRIPTYLINE HCL 75 MG TAB: 75 | 10 days supply | Qty: 10 | Fill #0

## 2017-06-25 MED FILL — CLINDAMYCIN PH 1% GEL: 1 | 30 days supply | Qty: 30 | Fill #0

## 2017-06-25 MED FILL — FLUCONAZOLE 150 MG TABLET: 150 | 7 days supply | Qty: 6 | Fill #1

## 2017-07-09 ENCOUNTER — Telehealth: Payer: BLUE CROSS/BLUE SHIELD | Admitting: Family

## 2017-07-09 DIAGNOSIS — K219 Gastro-esophageal reflux disease without esophagitis: Secondary | ICD-10-CM

## 2017-07-09 MED ORDER — METOPROLOL TARTRATE 25 MG PO TABS: 25.0000 mg | ORAL_TABLET | Freq: Two times a day (BID) | ORAL | 0 refills | Status: DC

## 2017-07-09 MED ORDER — AMITRIPTYLINE HCL 75 MG PO TABS: 75.0000 mg | ORAL_TABLET | Freq: Every day | ORAL | 0 refills | Status: AC

## 2017-07-09 MED ORDER — RANITIDINE HCL 150 MG PO TABS: 150.0000 mg | ORAL_TABLET | Freq: Two times a day (BID) | ORAL | 0 refills | Status: DC

## 2017-07-09 MED FILL — METOPROLOL TARTRATE 25 MG T: 25 | 14 days supply | Qty: 28 | Fill #0

## 2017-07-09 MED FILL — raNITIdine HCL 150 MG TABS: 150 | 14 days supply | Qty: 28 | Fill #0

## 2017-07-09 MED FILL — AMITRIPTYLINE HCL 75 MG TAB: 75 | 14 days supply | Qty: 14 | Fill #0

## 2017-07-09 NOTE — Progress Notes (Signed)
We are sorry that you are not feeling well.  Here is how we plan to help!  Based on what you shared with me it looks like you most likely have Gastroesophageal Reflux Disease (GERD)  Gastroesophageal reflux disease (GERD) happens when acid from your stomach flows up into the esophagus.  When acid comes in contact with the esophagus, the acid causes sorenss (inflammation) in the esophagus.  Over time, GERD may create small holes (ulcers) in the lining of the esophagus.  I have refilled your zantac 150 mg and also your amitriptyline and metoprolol. Please keep your appointment with your new provider as I will not be able refill these medications again.   Your symptoms should improve in the next day or two.  You can use antacids as needed until symptoms resolve.  Call us if your heartburn worsens, you have trouble swallowing, weight loss, spitting up blood or recurrent vomiting.  Home Care:  May include lifestyle changes such as weight loss, quitting smoking and alcohol consumption  Avoid foods and drinks that make your symptoms worse, such as:  Caffeine or alcoholic drinks  Chocolate  Peppermint or mint flavorings  Garlic and onions  Spicy foods  Citrus fruits, such as oranges, lemons, or limes  Tomato-based foods such as sauce, chili, salsa and pizza  Fried and fatty foods  Avoid lying down for 3 hours prior to your bedtime or prior to taking a nap  Eat small, frequent meals instead of a large meals  Wear loose-fitting clothing.  Do not wear anything tight around your waist that causes pressure on your stomach.  Raise the head of your bed 6 to 8 inches with wood blocks to help you sleep.  Extra pillows will not help.  Seek Help Right Away If:  You have pain in your arms, neck, jaw, teeth or back  Your pain increases or changes in intensity or duration  You develop nausea, vomiting or sweating (diaphoresis)  You develop shortness of breath or you faint  Your vomit is  green, yellow, black or looks like coffee grounds or blood  Your stool is red, bloody or black  These symptoms could be signs of other problems, such as heart disease, gastric bleeding or esophageal bleeding.  Make sure you :  Understand these instructions.  Will watch your condition.  Will get help right away if you are not doing well or get worse.  Your e-visit answers were reviewed by a board certified advanced clinical practitioner to complete your personal care plan.  Depending on the condition, your plan could have included both over the counter or prescription medications.  If there is a problem please reply  once you have received a response from your provider.  Your safety is important to us.  If you have drug allergies check your prescription carefully.    You can use MyChart to ask questions about today's visit, request a non-urgent call back, or ask for a work or school excuse for 24 hours related to this e-Visit. If it has been greater than 24 hours you will need to follow up with your provider, or enter a new e-Visit to address those concerns.  You will get an e-mail in the next two days asking about your experience.  I hope that your e-visit has been valuable and will speed your recovery. Thank you for using e-visits.

## 2017-07-20 DIAGNOSIS — Z79899 Other long term (current) drug therapy: Secondary | ICD-10-CM | POA: Diagnosis not present

## 2017-07-20 DIAGNOSIS — K219 Gastro-esophageal reflux disease without esophagitis: Secondary | ICD-10-CM | POA: Diagnosis not present

## 2017-07-20 DIAGNOSIS — G43701 Chronic migraine without aura, not intractable, with status migrainosus: Secondary | ICD-10-CM | POA: Diagnosis not present

## 2017-07-20 DIAGNOSIS — L700 Acne vulgaris: Secondary | ICD-10-CM | POA: Diagnosis not present

## 2017-07-20 MED FILL — AMITRIPTYLINE HCL 75 MG TAB: 75 | 90 days supply | Qty: 90 | Fill #0

## 2017-07-20 MED FILL — ELETRIPTAN HBR 20 MG TABLET: 20 | 30 days supply | Qty: 6 | Fill #0

## 2017-07-20 MED FILL — CLINDAMYCIN PH 1% GEL: 1 | 30 days supply | Qty: 60 | Fill #0

## 2017-07-20 MED FILL — raNITIdine HCL 150 MG TABS: 150 | 90 days supply | Qty: 180 | Fill #0

## 2017-07-20 MED FILL — ALPRAZolam 0.5 MG TABS: 0.5 | 30 days supply | Qty: 60 | Fill #0

## 2017-07-20 MED FILL — METOPROLOL TARTRATE 25 MG T: 25 | 90 days supply | Qty: 180 | Fill #0

## 2017-07-20 MED FILL — OMEPRAZOLE DR 40 MG CAPSULE: 40 | 90 days supply | Qty: 90 | Fill #0

## 2017-07-23 MED FILL — MUPIROCIN 2% OINTMENT: 2 | 10 days supply | Qty: 22 | Fill #0

## 2017-07-24 MED FILL — RIZATRIPTAN 10 MG TABLET: 10 | 30 days supply | Qty: 12 | Fill #0

## 2017-08-20 MED FILL — CLINDAMYCIN PH 1% GEL: 1 | 30 days supply | Qty: 60 | Fill #1

## 2017-08-20 MED FILL — ALPRAZolam 0.5 MG TABS: 0.5 | 30 days supply | Qty: 60 | Fill #1

## 2017-08-20 MED FILL — MUPIROCIN 2% OINTMENT: 2 | 10 days supply | Qty: 22 | Fill #1

## 2017-09-28 MED FILL — MUPIROCIN 2% OINTMENT: 2 | 10 days supply | Qty: 22 | Fill #2

## 2017-09-28 MED FILL — ALPRAZolam 0.5 MG TABS: 0.5 | 30 days supply | Qty: 60 | Fill #2

## 2017-09-28 MED FILL — CLINDAMYCIN PH 1% GEL: 1 | 30 days supply | Qty: 30 | Fill #1

## 2017-10-22 MED FILL — METOPROLOL TARTRATE 25 MG T: 25 | 90 days supply | Qty: 180 | Fill #1

## 2017-10-22 MED FILL — raNITIdine HCL 150 MG TABS: 150 | 90 days supply | Qty: 180 | Fill #1

## 2017-10-22 MED FILL — AMITRIPTYLINE HCL 75 MG TAB: 75 | 90 days supply | Qty: 90 | Fill #1

## 2017-10-22 MED FILL — MUPIROCIN 2% OINTMENT: 2 | 10 days supply | Qty: 22 | Fill #3

## 2017-10-25 ENCOUNTER — Telehealth: Payer: BLUE CROSS/BLUE SHIELD | Admitting: Family

## 2017-10-25 DIAGNOSIS — B9689 Other specified bacterial agents as the cause of diseases classified elsewhere: Secondary | ICD-10-CM

## 2017-10-25 DIAGNOSIS — N390 Urinary tract infection, site not specified: Secondary | ICD-10-CM

## 2017-10-25 DIAGNOSIS — B3731 Acute candidiasis of vulva and vagina: Secondary | ICD-10-CM

## 2017-10-25 DIAGNOSIS — B373 Candidiasis of vulva and vagina: Secondary | ICD-10-CM

## 2017-10-25 DIAGNOSIS — N760 Acute vaginitis: Secondary | ICD-10-CM

## 2017-10-25 MED ORDER — FLUCONAZOLE 150 MG PO TABS
150.0000 mg | ORAL_TABLET | Freq: Two times a day (BID) | ORAL | 0 refills | Status: AC
Start: 2017-10-25 — End: 2017-10-26

## 2017-10-25 MED ORDER — CEPHALEXIN 500 MG PO CAPS: 500.0000 mg | ORAL_CAPSULE | Freq: Two times a day (BID) | ORAL | 0 refills | Status: DC

## 2017-10-25 NOTE — Progress Notes (Signed)
Thank you for the details you included in the comment boxes. Those details are very helpful in determining the best course of treatment for you and help us to provide the best care. I have sent the Diflucan as you requested.  We are sorry that you are not feeling well.  Here is how we plan to help!  Based on what you shared with me it looks like you most likely have a simple urinary tract infection.  A UTI (Urinary Tract Infection) is a bacterial infection of the bladder.  Most cases of urinary tract infections are simple to treat but a key part of your care is to encourage you to drink plenty of fluids and watch your symptoms carefully.  I have prescribed Keflex 500 mg twice a day for 7 days.  Your symptoms should gradually improve. Call us if the burning in your urine worsens, you develop worsening fever, back pain or pelvic pain or if your symptoms do not resolve after completing the antibiotic.  Urinary tract infections can be prevented by drinking plenty of water to keep your body hydrated.  Also be sure when you wipe, wipe from front to back and don't hold it in!  If possible, empty your bladder every 4 hours.  Your e-visit answers were reviewed by a board certified advanced clinical practitioner to complete your personal care plan.  Depending on the condition, your plan could have included both over the counter or prescription medications.  If there is a problem please reply  once you have received a response from your provider.  Your safety is important to us.  If you have drug allergies check your prescription carefully.    You can use MyChart to ask questions about today's visit, request a non-urgent call back, or ask for a work or school excuse for 24 hours related to this e-Visit. If it has been greater than 24 hours you will need to follow up with your provider, or enter a new e-Visit to address those concerns.   You will get an e-mail in the next two days asking about your  experience.  I hope that your e-visit has been valuable and will speed your recovery. Thank you for using e-visits.

## 2017-11-16 MED FILL — ALPRAZolam 0.5 MG TABS: 0.5 | 30 days supply | Qty: 60 | Fill #3

## 2017-11-16 MED FILL — CLINDAMYCIN PH 1% GEL: 1 | 30 days supply | Qty: 30 | Fill #2

## 2017-12-03 MED FILL — HYDROCODON-APAP 5-325: 5-325 | 3 days supply | Qty: 11 | Fill #0

## 2017-12-26 MED FILL — CLINDAMYCIN PH 1% GEL: 1 | 30 days supply | Qty: 30 | Fill #3

## 2017-12-26 MED FILL — MUPIROCIN 2% OINTMENT: 2 | 10 days supply | Qty: 22 | Fill #4

## 2017-12-26 MED FILL — RIZATRIPTAN BENZOATE 10 MG: 10 | 30 days supply | Qty: 12 | Fill #1

## 2018-01-18 DIAGNOSIS — Z309 Encounter for contraceptive management, unspecified: Secondary | ICD-10-CM | POA: Diagnosis not present

## 2018-01-18 DIAGNOSIS — F411 Generalized anxiety disorder: Secondary | ICD-10-CM | POA: Diagnosis not present

## 2018-01-18 DIAGNOSIS — L700 Acne vulgaris: Secondary | ICD-10-CM | POA: Diagnosis not present

## 2018-01-18 DIAGNOSIS — G441 Vascular headache, not elsewhere classified: Secondary | ICD-10-CM | POA: Diagnosis not present

## 2018-01-18 DIAGNOSIS — K219 Gastro-esophageal reflux disease without esophagitis: Secondary | ICD-10-CM | POA: Diagnosis not present

## 2018-01-18 DIAGNOSIS — G43701 Chronic migraine without aura, not intractable, with status migrainosus: Secondary | ICD-10-CM | POA: Diagnosis not present

## 2018-01-18 DIAGNOSIS — Z79899 Other long term (current) drug therapy: Secondary | ICD-10-CM | POA: Diagnosis not present

## 2018-01-18 MED FILL — ALPRAZolam 1 MG TABS: 1 | 30 days supply | Qty: 60 | Fill #0

## 2018-01-18 MED FILL — AMITRIPTYLINE HCL 100 MG TA: 100 | 30 days supply | Qty: 30 | Fill #0

## 2018-01-18 MED FILL — OLOPATADINE HCL 0.2% EYE DR: 0.2 | 25 days supply | Qty: 3 | Fill #0

## 2018-01-18 MED FILL — METOPROLOL TARTRATE 25 MG T: 25 | 90 days supply | Qty: 180 | Fill #0

## 2018-01-18 MED FILL — CHLORHEXIDINE 0.12% RINSE: 0.12 | 47 days supply | Qty: 473 | Fill #0

## 2018-01-18 MED FILL — metroNIDAZOLE 0.75 % GEL: 0.75 | 5 days supply | Qty: 70 | Fill #0

## 2018-01-21 MED FILL — CLINDAMYCIN PH 1% GEL: 1 | 75 days supply | Qty: 90 | Fill #0

## 2018-01-21 MED FILL — MUPIROCIN 2% OINTMENT: 2 | 5 days supply | Qty: 22 | Fill #0

## 2018-01-21 MED FILL — RIZATRIPTAN BENZOATE 10 MG: 10 | 30 days supply | Qty: 18 | Fill #0

## 2018-02-12 MED FILL — AMITRIPTYLINE HCL 75 MG TAB: 75 | 90 days supply | Qty: 90 | Fill #0

## 2018-02-20 MED FILL — metroNIDAZOLE 0.75 % GEL: 0.75 | 5 days supply | Qty: 70 | Fill #1

## 2018-02-20 MED FILL — MUPIROCIN 2% OINTMENT: 2 | 5 days supply | Qty: 22 | Fill #1

## 2018-02-20 MED FILL — OLOPATADINE HCL 0.2 % SOLN: 0.2 | 25 days supply | Qty: 3 | Fill #1

## 2018-02-20 MED FILL — ALPRAZolam 1 MG TABS: 1 | 30 days supply | Qty: 60 | Fill #1

## 2018-02-27 MED FILL — DEXILANT DR 30 MG CAPSULE: 30 | 90 days supply | Qty: 90 | Fill #0

## 2018-03-15 DIAGNOSIS — Z01419 Encounter for gynecological examination (general) (routine) without abnormal findings: Secondary | ICD-10-CM | POA: Diagnosis not present

## 2018-03-15 DIAGNOSIS — L700 Acne vulgaris: Secondary | ICD-10-CM | POA: Diagnosis not present

## 2018-03-15 DIAGNOSIS — R000 Tachycardia, unspecified: Secondary | ICD-10-CM | POA: Diagnosis not present

## 2018-03-15 DIAGNOSIS — F430 Acute stress reaction: Secondary | ICD-10-CM | POA: Diagnosis not present

## 2018-03-15 DIAGNOSIS — F411 Generalized anxiety disorder: Secondary | ICD-10-CM | POA: Diagnosis not present

## 2018-03-26 MED FILL — LINZESS 72 MCG CAPSULE: 72 | 30 days supply | Qty: 30 | Fill #0

## 2018-03-26 MED FILL — CLINDAMYCIN PH 1% SOLUTION: 1 | 30 days supply | Qty: 60 | Fill #0

## 2018-03-27 MED FILL — CLINDAMYCIN PH 1% GEL: 1 | 75 days supply | Qty: 90 | Fill #1

## 2018-03-27 MED FILL — MUPIROCIN 2% OINTMENT: 2 | 5 days supply | Qty: 22 | Fill #2

## 2018-03-27 MED FILL — OLOPATADINE HCL 0.2 % SOLN: 0.2 | 25 days supply | Qty: 3 | Fill #2

## 2018-03-27 MED FILL — ALPRAZolam 1 MG TABS: 1 | 30 days supply | Qty: 60 | Fill #2

## 2018-05-23 MED FILL — LINZESS 72 MCG CAPSULE: 72 | 30 days supply | Qty: 30 | Fill #0

## 2018-05-23 MED FILL — AMITRIPTYLINE HCL 75 MG TAB: 75 | 90 days supply | Qty: 90 | Fill #0

## 2018-05-23 MED FILL — CLINDAMYCIN PH 1% SOLUTION: 1 | 30 days supply | Qty: 60 | Fill #1

## 2018-05-23 MED FILL — DEXILANT DR 30 MG CAPSULE: 30 | 90 days supply | Qty: 90 | Fill #1

## 2018-05-23 MED FILL — MUPIROCIN 2% OINTMENT: 2 | 5 days supply | Qty: 22 | Fill #3

## 2018-05-23 MED FILL — METOPROLOL TARTRATE 25 MG T: 25 | 90 days supply | Qty: 180 | Fill #0

## 2018-05-24 DIAGNOSIS — Z124 Encounter for screening for malignant neoplasm of cervix: Secondary | ICD-10-CM | POA: Diagnosis not present

## 2018-05-24 DIAGNOSIS — F411 Generalized anxiety disorder: Secondary | ICD-10-CM | POA: Diagnosis not present

## 2018-05-24 DIAGNOSIS — Z79899 Other long term (current) drug therapy: Secondary | ICD-10-CM | POA: Diagnosis not present

## 2018-05-24 DIAGNOSIS — Z01419 Encounter for gynecological examination (general) (routine) without abnormal findings: Secondary | ICD-10-CM | POA: Diagnosis not present

## 2018-05-24 DIAGNOSIS — F4322 Adjustment disorder with anxiety: Secondary | ICD-10-CM | POA: Diagnosis not present

## 2018-05-24 DIAGNOSIS — Z202 Contact with and (suspected) exposure to infections with a predominantly sexual mode of transmission: Secondary | ICD-10-CM | POA: Diagnosis not present

## 2018-05-24 DIAGNOSIS — E663 Overweight: Secondary | ICD-10-CM | POA: Diagnosis not present

## 2018-05-24 DIAGNOSIS — F430 Acute stress reaction: Secondary | ICD-10-CM | POA: Diagnosis not present

## 2018-05-24 DIAGNOSIS — K219 Gastro-esophageal reflux disease without esophagitis: Secondary | ICD-10-CM | POA: Diagnosis not present

## 2018-05-24 DIAGNOSIS — Z87410 Personal history of cervical dysplasia: Secondary | ICD-10-CM | POA: Diagnosis not present

## 2018-05-24 DIAGNOSIS — G43701 Chronic migraine without aura, not intractable, with status migrainosus: Secondary | ICD-10-CM | POA: Diagnosis not present

## 2018-05-24 DIAGNOSIS — R87612 Low grade squamous intraepithelial lesion on cytologic smear of cervix (LGSIL): Secondary | ICD-10-CM | POA: Diagnosis not present

## 2018-05-24 DIAGNOSIS — Z309 Encounter for contraceptive management, unspecified: Secondary | ICD-10-CM | POA: Diagnosis not present

## 2018-06-05 MED FILL — ALPRAZolam 1 MG TABS: 1 | 30 days supply | Qty: 60 | Fill #0

## 2018-06-19 ENCOUNTER — Other Ambulatory Visit: Payer: Self-pay | Admitting: Obstetrics and Gynecology

## 2018-06-19 DIAGNOSIS — R1030 Lower abdominal pain, unspecified: Secondary | ICD-10-CM | POA: Diagnosis not present

## 2018-06-19 DIAGNOSIS — N946 Dysmenorrhea, unspecified: Secondary | ICD-10-CM | POA: Diagnosis not present

## 2018-06-19 DIAGNOSIS — R87618 Other abnormal cytological findings on specimens from cervix uteri: Secondary | ICD-10-CM | POA: Diagnosis not present

## 2018-07-01 DIAGNOSIS — N946 Dysmenorrhea, unspecified: Secondary | ICD-10-CM | POA: Diagnosis not present

## 2018-07-08 MED FILL — CLINDAMYCIN PH 1% GEL: 1 | 90 days supply | Qty: 90 | Fill #0

## 2018-07-08 MED FILL — LINZESS 72 MCG CAPSULE: 72 | 30 days supply | Qty: 30 | Fill #1

## 2018-07-08 MED FILL — ALPRAZolam 1 MG TABS: 1 | 30 days supply | Qty: 60 | Fill #1

## 2018-07-08 MED FILL — CLINDAMYCIN PH 1% SOLUTION: 1 | 30 days supply | Qty: 60 | Fill #2

## 2018-08-21 DIAGNOSIS — N83202 Unspecified ovarian cyst, left side: Secondary | ICD-10-CM | POA: Diagnosis not present

## 2018-08-26 ENCOUNTER — Other Ambulatory Visit: Payer: Self-pay | Admitting: Obstetrics and Gynecology

## 2018-08-26 DIAGNOSIS — Z3202 Encounter for pregnancy test, result negative: Secondary | ICD-10-CM | POA: Diagnosis not present

## 2018-08-26 DIAGNOSIS — N888 Other specified noninflammatory disorders of cervix uteri: Secondary | ICD-10-CM | POA: Diagnosis not present

## 2018-08-26 DIAGNOSIS — Z8742 Personal history of other diseases of the female genital tract: Secondary | ICD-10-CM | POA: Diagnosis not present

## 2018-08-27 MED FILL — CLINDAMYCIN PH 1% SOLUTION: 1 | 30 days supply | Qty: 60 | Fill #3

## 2018-08-27 MED FILL — MUPIROCIN 2% OINTMENT: 2 | 5 days supply | Qty: 22 | Fill #4

## 2018-08-27 MED FILL — METOPROLOL TARTRATE 25 MG T: 25 | 90 days supply | Qty: 180 | Fill #1

## 2018-08-27 MED FILL — ALPRAZolam 1 MG TABS: 1 | 30 days supply | Qty: 60 | Fill #2

## 2018-08-29 MED FILL — AMITRIPTYLINE HCL 75 MG TAB: 75 | 90 days supply | Qty: 90 | Fill #0

## 2018-09-16 ENCOUNTER — Other Ambulatory Visit: Payer: Self-pay | Admitting: Obstetrics and Gynecology

## 2018-09-16 DIAGNOSIS — N762 Acute vulvitis: Secondary | ICD-10-CM | POA: Diagnosis not present

## 2018-09-16 DIAGNOSIS — B977 Papillomavirus as the cause of diseases classified elsewhere: Secondary | ICD-10-CM | POA: Diagnosis not present

## 2018-09-16 DIAGNOSIS — N9089 Other specified noninflammatory disorders of vulva and perineum: Secondary | ICD-10-CM | POA: Diagnosis not present

## 2018-09-16 DIAGNOSIS — L819 Disorder of pigmentation, unspecified: Secondary | ICD-10-CM | POA: Diagnosis not present

## 2018-09-25 MED FILL — DEXILANT DR 30 MG CAPSULE: 30 | 90 days supply | Qty: 90 | Fill #0

## 2018-09-25 MED FILL — LINZESS 72 MCG CAPSULE: 72 | 30 days supply | Qty: 30 | Fill #1

## 2018-09-25 MED FILL — MUPIROCIN 2% OINTMENT: 2 | 5 days supply | Qty: 22 | Fill #5

## 2018-09-25 MED FILL — CLINDAMYCIN PH 1% SOLUTION: 1 | 30 days supply | Qty: 60 | Fill #4

## 2018-10-04 MED FILL — CLOBETASOL PROPIONATE 0.05: 0.05 | 14 days supply | Qty: 15 | Fill #0

## 2018-10-23 MED FILL — CLINDAMYCIN PH 1% GEL: 1 | 90 days supply | Qty: 90 | Fill #1

## 2018-10-23 MED FILL — MUPIROCIN 2% OINTMENT: 2 | 5 days supply | Qty: 22 | Fill #6

## 2018-11-01 ENCOUNTER — Encounter (HOSPITAL_COMMUNITY): Payer: Self-pay | Admitting: Emergency Medicine

## 2018-11-01 ENCOUNTER — Other Ambulatory Visit: Payer: Self-pay

## 2018-11-01 ENCOUNTER — Emergency Department (HOSPITAL_COMMUNITY)
Admission: EM | Admit: 2018-11-01 | Discharge: 2018-11-01 | Disposition: A | Discharge status: Home / Self Care | Payer: 59 | Attending: Emergency Medicine | Admitting: Emergency Medicine

## 2018-11-01 DIAGNOSIS — R112 Nausea with vomiting, unspecified: Secondary | ICD-10-CM | POA: Diagnosis not present

## 2018-11-01 DIAGNOSIS — Z79899 Other long term (current) drug therapy: Secondary | ICD-10-CM | POA: Insufficient documentation

## 2018-11-01 DIAGNOSIS — R197 Diarrhea, unspecified: Secondary | ICD-10-CM | POA: Insufficient documentation

## 2018-11-01 LAB — COMPREHENSIVE METABOLIC PANEL
ALBUMIN: 4.4 g/dL (ref 3.5–5.0)
ALK PHOS: 72 U/L (ref 38–126)
ALT: 17 U/L (ref 0–44)
AST: 20 U/L (ref 15–41)
Anion gap: 11 (ref 5–15)
BUN: 13 mg/dL (ref 6–20)
CALCIUM: 9 mg/dL (ref 8.9–10.3)
CHLORIDE: 102 mmol/L (ref 98–111)
CO2: 24 mmol/L (ref 22–32)
CREATININE: 0.78 mg/dL (ref 0.44–1.00)
GFR calc Af Amer: >60 mL/min (ref >60)
GFR calc non Af Amer: >60 mL/min (ref >60)
GLUCOSE: 92 mg/dL (ref 70–99)
Potassium: 3.6 mmol/L (ref 3.5–5.1)
Sodium: 137 mmol/L (ref 135–145)
Total Bilirubin: 1 mg/dL (ref 0.3–1.2)
Total Protein: 8.8 g/dL — ABNORMAL HIGH (ref 6.5–8.1)

## 2018-11-01 LAB — CBC
HEMATOCRIT: 43.1 % (ref 36.0–46.0)
Hemoglobin: 13.9 g/dL (ref 12.0–15.0)
MCH: 30.8 pg (ref 26.0–34.0)
MCHC: 32.3 g/dL (ref 30.0–36.0)
MCV: 95.6 fL (ref 80.0–100.0)
PLATELETS: 227 10*3/uL (ref 150–400)
RBC: 4.51 MIL/uL (ref 3.87–5.11)
RDW: 12.5 % (ref 11.5–15.5)
WBC: 10.2 10*3/uL (ref 4.0–10.5)
nRBC: 0 % (ref 0.0–0.2)

## 2018-11-01 LAB — URINALYSIS, ROUTINE W REFLEX MICROSCOPIC
Bilirubin Urine: NEGATIVE
Glucose, UA: NEGATIVE mg/dL
HGB URINE DIPSTICK: NEGATIVE
KETONES UR: 80 mg/dL — AB
Leukocytes,Ua: NEGATIVE
Nitrite: NEGATIVE
PH: 5 (ref 5.0–8.0)
PROTEIN: NEGATIVE mg/dL
Specific Gravity, Urine: 1.02 (ref 1.005–1.030)

## 2018-11-01 LAB — I-STAT BETA HCG BLOOD, ED (MC, WL, AP ONLY): I-stat hCG, quantitative: <5 m[IU]/mL (ref <5)

## 2018-11-01 LAB — LIPASE, BLOOD: Lipase: 29 U/L (ref 11–51)

## 2018-11-01 MED ORDER — LOPERAMIDE HCL 2 MG PO CAPS
4.0000 mg | ORAL_CAPSULE | Freq: Once | ORAL | Status: AC
  Administered 2018-11-01: 4 mg via ORAL
  Filled 2018-11-01: qty 2

## 2018-11-01 MED ORDER — IBUPROFEN 800 MG PO TABS
800.0000 mg | ORAL_TABLET | Freq: Once | ORAL | Status: AC
  Administered 2018-11-01: 800 mg via ORAL
  Filled 2018-11-01: qty 1

## 2018-11-01 MED ORDER — SODIUM CHLORIDE 0.9% FLUSH
3.0000 mL | Freq: Once | INTRAVENOUS | Status: AC
Start: 2018-11-01 — End: 2018-11-01
  Administered 2018-11-01: 3 mL via INTRAVENOUS

## 2018-11-01 MED ORDER — ONDANSETRON HCL 4 MG PO TABS: 4.0000 mg | ORAL_TABLET | Freq: Three times a day (TID) | ORAL | 0 refills | Status: DC | PRN

## 2018-11-01 MED ORDER — DICYCLOMINE HCL 20 MG PO TABS: 20.0000 mg | ORAL_TABLET | Freq: Two times a day (BID) | ORAL | 0 refills | Status: DC

## 2018-11-01 MED ORDER — ONDANSETRON HCL 4 MG/2ML IJ SOLN
4.0000 mg | Freq: Once | INTRAMUSCULAR | Status: AC | PRN
  Administered 2018-11-01: 4 mg via INTRAVENOUS
  Filled 2018-11-01: qty 2

## 2018-11-01 MED ORDER — SODIUM CHLORIDE 0.9 % IV BOLUS
1000.0000 mL | Freq: Once | INTRAVENOUS | Status: AC
  Administered 2018-11-01: 1000 mL via INTRAVENOUS

## 2018-11-01 NOTE — Discharge Instructions (Signed)
Get help right away if: °You have chest pain. °You feel extremely weak or you faint. °You see blood in your vomit. °Your vomit looks like coffee grounds. °You have bloody or black stools or stools that look like tar. °You have a severe headache, a stiff neck, or both. °You have a rash. °You have severe pain, cramping, or bloating in your abdomen. °You have trouble breathing or you are breathing very quickly. °Your heart is beating very quickly. °Your skin feels cold and clammy. °You feel confused. °You have pain when you urinate. °You have signs of dehydration, such as: °Dark urine, very little urine, or no urine. °Cracked lips. °Dry mouth. °Sunken eyes. °Sleepiness. °Weakness. °

## 2018-11-01 NOTE — ED Notes (Signed)
EDPA Provider at bedside. 

## 2018-11-01 NOTE — ED Notes (Signed)
CULTURE SENT 

## 2018-11-01 NOTE — ED Notes (Signed)
PT STATES HR IS ALWAYS HIGH 100-109. HAS NOT TAKE HEART MEDICATION TODAY. ORTHO STATICS PERFORMED

## 2018-11-01 NOTE — ED Provider Notes (Signed)
[facility]-EMERGENCY DEPT Provider Note   CSN: [id number] Arrival date & time: 11/01/18  1058     History   Chief Complaint Chief Complaint  Patient presents with  . Abdominal Pain  . Emesis  . Diarrhea    HPI Christina [name] is a 38 y.o. female who presents emergency department chief complaint of nausea vomiting and diarrhea.  Patient presents with sudden onset of nausea followed by multiple episodes of nonbloody nonbilious vomitus.  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  She denies any recent foreign travel, ingestion of suspicious foods.  She denies abdominal pain, urinary symptoms, back pain, fever.  She describes her diarrhea as brown and watery.  She is unable to hold down any foods of fluid prior to arrival.  She is unable to hold any types of medications for treatment of symptoms.  Not had anything like this on a regular basis in the past.  HPI  Past Medical History:  Diagnosis Date  . Abnormal Pap smear   . BV (bacterial vaginosis)   . Migraine     Patient Active Problem List   Diagnosis Date Noted  . Migraine with aura 12/04/2012  . Cephalgia 12/04/2012    Past Surgical History:  Procedure Laterality Date  . CESAREAN SECTION    . DENTAL SURGERY       OB History    Gravida  4   Para  2   Term  2   Preterm      AB  2   Living  2     SAB  2   TAB      Ectopic      Multiple      Live Births               Home Medications    Prior to Admission medications   Medication Sig Start Date End Date Taking? Authorizing Provider  amitriptyline (ELAVIL) 50 MG tablet Take 50 mg by mouth at bedtime.    [provider]  amitriptyline (ELAVIL) 75 MG tablet Take 1 tablet (75 mg total) by mouth at bedtime. 07/09/17   Jannifer Rodney A, FNP  cephALEXin (KEFLEX) 500 MG capsule Take 1 capsule (500 mg total) by mouth 2 (two) times daily. 10/25/17   Withrow, Everardo All, FNP  metoprolol tartrate (LOPRESSOR) 25 MG tablet Take 1 tablet (25 mg total) by mouth 2 (two) times daily. 07/09/17   Junie Spencer, FNP  omeprazole (PRILOSEC) 20 MG capsule Take 20 mg by mouth daily. 01/02/14   Renne Crigler, PA-C  propranolol (INDERAL) 40 MG tablet Take 40 mg by mouth 2 (two) times daily. 03/17/15   [provider]  ranitidine (ZANTAC) 150 MG tablet Take 1 tablet (150 mg total) by mouth 2 (two) times daily. 07/09/17   Hawks, Christy A, FNP  RELPAX 20 MG tablet TAKE 1 TABLET BY MOUTH AT ONSET OF HEADACHE, REPEAT IN 2 HRS IF NO IMPROVEMENT 03/11/15   [provider]  sucralfate (CARAFATE) 1 G tablet 1 TABLET BY MOUTH FOUR TIMES A DAY 02/03/15   [provider]    Family History Family History  Problem Relation Age of Onset  . Hypertension Mother   . Stroke Mother   . Cancer Father   . Hypertension Father   . Migraines Father     Social History Social History   Tobacco Use  . Smoking  status: Never Smoker  . Smokeless tobacco: Never Used  Substance Use Topics  . Alcohol use: No  . Drug use: No     Allergies   Patient has no known allergies.   Review of Systems Review of Systems  Ten systems reviewed and are negative for acute change, except as noted in the HPI.   Physical Exam Updated Vital Signs BP (!) 106/59   Pulse 97   Temp 99 F (37.2 C) (Oral)   Resp 14   LMP 09/18/2018   SpO2 98%   Physical Exam Vitals signs and nursing note reviewed.  Constitutional:      General: She is not in acute distress.    Appearance: She is well-developed. She is not diaphoretic.  HENT:     Head: Normocephalic and atraumatic.     Mouth/Throat:     Mouth: Mucous membranes are dry.  Eyes:     General: No scleral icterus.    Conjunctiva/sclera: Conjunctivae normal.  Neck:     Musculoskeletal: Normal range of motion.  Cardiovascular:     Rate and Rhythm: Normal rate and regular rhythm.     Heart sounds: Normal heart sounds. No  murmur. No friction rub. No gallop.   Pulmonary:     Effort: Pulmonary effort is normal. No respiratory distress.     Breath sounds: Normal breath sounds.  Abdominal:     General: Bowel sounds are normal. There is no distension.     Palpations: Abdomen is soft. There is no mass.     Tenderness: There is no abdominal tenderness. There is no guarding or rebound.     Hernia: No hernia is present.  Skin:    General: Skin is warm and dry.  Neurological:     Mental Status: She is alert and oriented to person, place, and time.  Psychiatric:        Behavior: Behavior normal.      ED Treatments / Results  Labs (all labs ordered are listed, but only abnormal results are displayed) Labs Reviewed  LIPASE, BLOOD  COMPREHENSIVE METABOLIC PANEL  CBC  URINALYSIS, ROUTINE W REFLEX MICROSCOPIC  I-STAT BETA HCG BLOOD, ED ([facility], [facility], [facility] ONLY)    EKG None  Radiology No results found.  Procedures Procedures (including critical care time)  Medications Ordered in ED Medications  sodium chloride flush (NS) 0.9 % injection 3 mL (3 mLs Intravenous Given 11/01/18 1215)  ondansetron (ZOFRAN) injection 4 mg (4 mg Intravenous Given 11/01/18 1215)     Initial Impression / Assessment and Plan / ED Course  I have reviewed the triage vital signs and the nursing notes.  Pertinent labs & imaging results that were available during my care of the patient were reviewed by me and considered in my medical decision making (see chart for details).     Patient with symptoms consistent with viral gastroenteritis.  Vitals are stable, no fever.  No signs of dehydration, tolerating PO fluids > 6 oz.  Lungs are clear.  No focal abdominal pain, no concern for appendicitis, cholecystitis, pancreatitis, ruptured viscus, UTI, kidney stone, or any other abdominal etiology.  Supportive therapy indicated with return if symptoms worsen.  Patient counseled.   Final Clinical Impressions(s) / ED Diagnoses   Final diagnoses:   Nausea vomiting and diarrhea    ED Discharge Orders    None       Arthor Captain, PA-C 11/01/18 1729    Gwyneth Sprout, MD 11/02/18 1541

## 2018-11-01 NOTE — ED Notes (Signed)
PT CONTINUES TO BE AWARE OF NEED FOR URINE SAMPLE. UNABLE TO OBTAIN AT THIS TIME

## 2018-11-01 NOTE — ED Triage Notes (Signed)
Pt reports works at [employer] and last night when got home started having n/v/d. Reports been around sick coworkers.

## 2018-11-01 NOTE — ED Notes (Signed)
Pt requested something for diarrhea. Care team made aware.

## 2018-11-01 NOTE — ED Notes (Signed)
AWARE OF NEED FOR URINE 

## 2018-11-04 ENCOUNTER — Telehealth: Payer: 59 | Admitting: Family

## 2018-11-04 DIAGNOSIS — R112 Nausea with vomiting, unspecified: Secondary | ICD-10-CM

## 2018-11-04 NOTE — Progress Notes (Signed)
Based on what you shared with me it looks like you have a serious condition that should be evaluated in a face to face office visit.  NOTE: If you entered your credit card information for this eVisit, you will not be charged. You may see a "hold" on your card for the $30 but that hold will drop off and you will not have a charge processed.  I reviewed your ED note, and it was felt this was viral. Usually with viral symptoms they do not last 3+ days. I think it would be best to be seen face to face to rule out other causes.    Approximately, 5 mins was spent documenting and reviewing patient's chart.   If you are having a true medical emergency please call 911.  If you need an urgent face to face visit, [facility] has four urgent care centers for your convenience.  If you need care fast and have a high deductible or no insurance consider:   [email or web address] to reserve your spot online an avoid wait times  [facility] [address], [address] [place], [place] [address] 8 am to 8 pm Monday-Friday 10 am to 4 pm Saturday-Sunday *Across the street from [employer]  [address] [place], [address] 8 am to 5 pm Monday-Friday * In the [facility] on the [facility]   The following sites will take your  insurance:  . [facility]  [phone] Get Driving Directions Find a Provider at this Location  [address] [place], [place] [address] . 10 am to 8 pm Monday-Friday . 12 pm to 8 pm Saturday-Sunday   . [facility] Urgent Care at [facility]  [phone] Get Driving Directions Find a Provider at this Location  [address], [address] [place], [place] [address] . 8 am to 8 pm Monday-Friday . 9 am to 6 pm Saturday . 11 am to 6 pm Sunday   . [facility] Health Urgent Care at [facility]  [phone] Get Driving Directions  [address] [place], [place] [address] . 8  am to 8 pm Monday-Friday . 8 am to 4 pm Saturday-Sunday   Your e-visit answers were reviewed by a board certified advanced clinical practitioner to complete your personal care plan.  Thank you for using e-Visits.

## 2018-12-04 DIAGNOSIS — F430 Acute stress reaction: Secondary | ICD-10-CM | POA: Diagnosis not present

## 2018-12-04 DIAGNOSIS — G43701 Chronic migraine without aura, not intractable, with status migrainosus: Secondary | ICD-10-CM | POA: Diagnosis not present

## 2018-12-04 DIAGNOSIS — R000 Tachycardia, unspecified: Secondary | ICD-10-CM | POA: Diagnosis not present

## 2018-12-04 DIAGNOSIS — F4322 Adjustment disorder with anxiety: Secondary | ICD-10-CM | POA: Diagnosis not present

## 2018-12-04 DIAGNOSIS — Z79899 Other long term (current) drug therapy: Secondary | ICD-10-CM | POA: Diagnosis not present

## 2018-12-04 DIAGNOSIS — F411 Generalized anxiety disorder: Secondary | ICD-10-CM | POA: Diagnosis not present

## 2018-12-04 DIAGNOSIS — K219 Gastro-esophageal reflux disease without esophagitis: Secondary | ICD-10-CM | POA: Diagnosis not present

## 2018-12-04 MED FILL — DEXILANT DR 30 MG CAPSULE: 30 | 90 days supply | Qty: 90 | Fill #0

## 2018-12-04 MED FILL — METOPROLOL TARTRATE 25 MG T: 25 | 90 days supply | Qty: 180 | Fill #0

## 2018-12-04 MED FILL — AMITRIPTYLINE HCL 75 MG TAB: 75 | 90 days supply | Qty: 90 | Fill #0

## 2018-12-04 MED FILL — RIZATRIPTAN BENZOATE 10 MG: 10 | 15 days supply | Qty: 3 | Fill #0

## 2018-12-04 MED FILL — LINZESS 72 MCG CAPSULE: 72 | 90 days supply | Qty: 90 | Fill #0

## 2018-12-04 MED FILL — ALPRAZolam 1 MG TABS: 1 | 30 days supply | Qty: 60 | Fill #0 | Status: TO

## 2018-12-04 MED FILL — metroNIDAZOLE 0.75 % CREA: 0.75 | 30 days supply | Qty: 45 | Fill #0

## 2018-12-05 MED FILL — NYSTATIN 100,000 UNITS/ML S: 100000 | 14 days supply | Qty: 140 | Fill #0

## 2019-01-08 DIAGNOSIS — Z309 Encounter for contraceptive management, unspecified: Secondary | ICD-10-CM | POA: Diagnosis not present

## 2019-01-08 DIAGNOSIS — Z79899 Other long term (current) drug therapy: Secondary | ICD-10-CM | POA: Diagnosis not present

## 2019-01-08 DIAGNOSIS — Z3042 Encounter for surveillance of injectable contraceptive: Secondary | ICD-10-CM | POA: Diagnosis not present

## 2019-01-08 MED FILL — medroxyPROGESTERone ACETATE: 150 | 90 days supply | Qty: 1 | Fill #0

## 2019-01-08 MED FILL — NYSTATIN 100,000 UNITS/ML S: 100000 | 14 days supply | Qty: 140 | Fill #0

## 2019-01-08 MED FILL — metroNIDAZOLE 0.75 % GEL: 0.75 | 7 days supply | Qty: 70 | Fill #0

## 2019-01-08 MED FILL — ALPRAZolam 1 MG TABS: 1 | 30 days supply | Qty: 60 | Fill #0 | Status: TO

## 2019-01-20 MED FILL — MUPIROCIN 2% OINTMENT: 2 | 90 days supply | Qty: 176 | Fill #0

## 2019-01-20 MED FILL — FLUCONAZOLE 150 MG TABS: 150 | 4 days supply | Qty: 2 | Fill #0

## 2019-01-20 MED FILL — CLINDAMYCIN PH 1% GEL: 1 | 90 days supply | Qty: 90 | Fill #0

## 2019-01-21 MED FILL — NYSTATIN 100,000 UNITS/ML S: 100000 | 14 days supply | Qty: 210 | Fill #0

## 2019-02-19 MED FILL — ALPRAZolam 1 MG TABS: 1 | 30 days supply | Qty: 60 | Fill #0

## 2019-03-05 MED FILL — AMITRIPTYLINE HCL 75 MG TAB: 75 | 90 days supply | Qty: 90 | Fill #1

## 2019-03-05 MED FILL — LINZESS 72 MCG CAPSULE: 72 | 90 days supply | Qty: 90 | Fill #1

## 2019-03-05 MED FILL — DEXILANT DR 30 MG CAPSULE: 30 | 90 days supply | Qty: 90 | Fill #1

## 2019-03-05 MED FILL — METOPROLOL TARTRATE 25 MG T: 25 | 90 days supply | Qty: 180 | Fill #1

## 2019-04-02 DIAGNOSIS — L2084 Intrinsic (allergic) eczema: Secondary | ICD-10-CM | POA: Diagnosis not present

## 2019-04-02 DIAGNOSIS — L700 Acne vulgaris: Secondary | ICD-10-CM | POA: Diagnosis not present

## 2019-04-02 MED FILL — TRIAMCINOLONE 0.1% CREAM: 0.1 | 21 days supply | Qty: 80 | Fill #0

## 2019-04-09 DIAGNOSIS — K219 Gastro-esophageal reflux disease without esophagitis: Secondary | ICD-10-CM | POA: Diagnosis not present

## 2019-04-09 DIAGNOSIS — Z3042 Encounter for surveillance of injectable contraceptive: Secondary | ICD-10-CM | POA: Diagnosis not present

## 2019-04-09 DIAGNOSIS — L700 Acne vulgaris: Secondary | ICD-10-CM | POA: Diagnosis not present

## 2019-04-09 DIAGNOSIS — Z202 Contact with and (suspected) exposure to infections with a predominantly sexual mode of transmission: Secondary | ICD-10-CM | POA: Diagnosis not present

## 2019-04-09 DIAGNOSIS — G43701 Chronic migraine without aura, not intractable, with status migrainosus: Secondary | ICD-10-CM | POA: Diagnosis not present

## 2019-04-09 DIAGNOSIS — Z309 Encounter for contraceptive management, unspecified: Secondary | ICD-10-CM | POA: Diagnosis not present

## 2019-04-09 DIAGNOSIS — F411 Generalized anxiety disorder: Secondary | ICD-10-CM | POA: Diagnosis not present

## 2019-04-09 MED FILL — medroxyPROGESTERone ACETATE: 150 | 90 days supply | Qty: 1 | Fill #0

## 2019-04-09 MED FILL — ALPRAZolam 1 MG TABS: 1 | 30 days supply | Qty: 60 | Fill #1

## 2019-04-09 MED FILL — CLINDAMYCIN PH 1% GEL: 1 | 90 days supply | Qty: 90 | Fill #0

## 2019-04-09 MED FILL — MUPIROCIN 2% OINTMENT: 2 | 90 days supply | Qty: 176 | Fill #0

## 2019-04-11 DIAGNOSIS — Z23 Encounter for immunization: Secondary | ICD-10-CM | POA: Diagnosis not present

## 2019-04-11 MED FILL — NYSTATIN 100,000 UNITS/ML S: 100000 | 14 days supply | Qty: 210 | Fill #0

## 2019-04-11 MED FILL — RIZATRIPTAN BENZOATE 10 MG: 10 | 30 days supply | Qty: 9 | Fill #0

## 2019-04-11 MED FILL — AMOX-CLAV 875-125 MG TABLET: 875-125 | 7 days supply | Qty: 14 | Fill #0

## 2019-05-07 DIAGNOSIS — L700 Acne vulgaris: Secondary | ICD-10-CM | POA: Diagnosis not present

## 2019-05-07 MED FILL — MYORISAN 40 MG CAPSULE: 40 | 30 days supply | Qty: 30 | Fill #0

## 2019-05-12 DIAGNOSIS — N9089 Other specified noninflammatory disorders of vulva and perineum: Secondary | ICD-10-CM | POA: Diagnosis not present

## 2019-05-12 DIAGNOSIS — Z113 Encounter for screening for infections with a predominantly sexual mode of transmission: Secondary | ICD-10-CM | POA: Diagnosis not present

## 2019-05-12 DIAGNOSIS — N898 Other specified noninflammatory disorders of vagina: Secondary | ICD-10-CM | POA: Diagnosis not present

## 2019-05-12 MED FILL — valACYclovir HCL 1 GM TABS: 1 | 10 days supply | Qty: 20 | Fill #0

## 2019-05-13 MED FILL — LIDOCAINE HCL 2% JELLY: 2 | 20 days supply | Qty: 30 | Fill #0

## 2019-05-14 MED FILL — VALACYCLOVIR HCL 500 MG TAB: 500 | 30 days supply | Qty: 30 | Fill #0

## 2019-05-14 MED FILL — FLUCONAZOLE 150 MG TABS: 150 | 1 days supply | Qty: 1 | Fill #0

## 2019-05-27 MED FILL — DEXILANT DR 30 MG CAPSULE: 30 | 90 days supply | Qty: 90 | Fill #1

## 2019-05-27 MED FILL — LINZESS 72 MCG CAPSULE: 72 | 30 days supply | Qty: 30 | Fill #0

## 2019-05-31 DIAGNOSIS — H5213 Myopia, bilateral: Secondary | ICD-10-CM | POA: Diagnosis not present

## 2019-06-04 DIAGNOSIS — L700 Acne vulgaris: Secondary | ICD-10-CM | POA: Diagnosis not present

## 2019-06-04 MED FILL — MYORISAN 40 MG CAPSULE: 40 | 30 days supply | Qty: 30 | Fill #0

## 2019-06-06 MED FILL — LINZESS 72 MCG CAPSULE: 72 | 30 days supply | Qty: 30 | Fill #0

## 2019-06-06 MED FILL — DEXILANT DR 30 MG CAPSULE: 30 | 90 days supply | Qty: 90 | Fill #1

## 2019-06-09 MED FILL — VALACYCLOVIR HCL 500 MG TAB: 500 | 30 days supply | Qty: 30 | Fill #1

## 2019-06-11 MED FILL — FLUCONAZOLE 150 MG TABLET: 150 | 2 days supply | Qty: 2 | Fill #0

## 2019-06-11 MED FILL — RIZATRIPTAN BENZOATE 10 MG: 10 | 5 days supply | Qty: 3 | Fill #0

## 2019-06-11 MED FILL — AMITRIPTYLINE HCL 75 MG TAB: 75 | 90 days supply | Qty: 90 | Fill #0

## 2019-06-11 MED FILL — ALPRAZolam 1 MG TABS: 1 | 30 days supply | Qty: 60 | Fill #0

## 2019-06-18 DIAGNOSIS — Z23 Encounter for immunization: Secondary | ICD-10-CM | POA: Diagnosis not present

## 2019-06-18 MED FILL — RIZATRIPTAN BENZOATE 10 MG: 10 | 90 days supply | Qty: 27 | Fill #0

## 2019-06-18 MED FILL — FLUCONAZOLE 100 MG TABLET: 100 | 6 days supply | Qty: 6 | Fill #0

## 2019-06-18 MED FILL — NYSTATIN 100,000 UNITS/ML S: 100000 | 14 days supply | Qty: 210 | Fill #0

## 2019-06-18 MED FILL — METOPROLOL TARTRATE 25 MG T: 25 | 90 days supply | Qty: 180 | Fill #0

## 2019-06-18 MED FILL — metroNIDAZOLE 0.75 % GEL: 0.75 | 5 days supply | Qty: 70 | Fill #0

## 2019-06-23 MED FILL — medroxyPROGESTERone ACETATE: 150 | 84 days supply | Qty: 1 | Fill #0

## 2019-06-23 MED FILL — metroNIDAZOLE 0.75 % GEL: 0.75 | 5 days supply | Qty: 70 | Fill #0

## 2019-06-30 MED FILL — MUPIROCIN 2% OINTMENT: 2 | 90 days supply | Qty: 176 | Fill #0

## 2019-06-30 MED FILL — LINZESS 72 MCG CAPSULE: 72 | 90 days supply | Qty: 90 | Fill #0

## 2019-06-30 MED FILL — CLINDAMYCIN PH 1% GEL: 1 | 90 days supply | Qty: 90 | Fill #0

## 2019-07-04 MED FILL — metroNIDAZOLE 0.75 % GEL: 0.75 | 5 days supply | Qty: 70 | Fill #0

## 2019-07-04 MED FILL — VALACYCLOVIR HCL 500 MG TAB: 500 | 30 days supply | Qty: 30 | Fill #2

## 2019-07-04 MED FILL — medroxyPROGESTERone ACETATE: 150 | 84 days supply | Qty: 1 | Fill #0

## 2019-07-04 MED FILL — ALPRAZolam 2 MG TABS: 2 | 30 days supply | Qty: 30 | Fill #0

## 2019-07-04 MED FILL — ALPRAZolam 0.5 MG TABS: 0.5 | 30 days supply | Qty: 30 | Fill #0

## 2019-07-08 DIAGNOSIS — Z79899 Other long term (current) drug therapy: Secondary | ICD-10-CM | POA: Diagnosis not present

## 2019-07-08 DIAGNOSIS — L700 Acne vulgaris: Secondary | ICD-10-CM | POA: Diagnosis not present

## 2019-07-08 MED FILL — MYORISAN 30 MG CAPSULE: 30 | 30 days supply | Qty: 60 | Fill #0

## 2019-07-23 DIAGNOSIS — Z3202 Encounter for pregnancy test, result negative: Secondary | ICD-10-CM | POA: Diagnosis not present

## 2019-07-23 DIAGNOSIS — Z309 Encounter for contraceptive management, unspecified: Secondary | ICD-10-CM | POA: Diagnosis not present

## 2019-07-23 DIAGNOSIS — Z3042 Encounter for surveillance of injectable contraceptive: Secondary | ICD-10-CM | POA: Diagnosis not present

## 2019-07-23 DIAGNOSIS — Z79899 Other long term (current) drug therapy: Secondary | ICD-10-CM | POA: Diagnosis not present

## 2019-07-23 MED FILL — FLUCONAZOLE 100 MG TABLET: 100 | 6 days supply | Qty: 6 | Fill #0

## 2019-07-30 MED FILL — VALACYCLOVIR HCL 500 MG TAB: 500 | 30 days supply | Qty: 30 | Fill #3

## 2019-07-30 MED FILL — METOPROLOL TARTRATE 25 MG T: 25 | 90 days supply | Qty: 180 | Fill #0

## 2019-08-01 MED FILL — ALPRAZolam 2 MG TABS: 2 | 30 days supply | Qty: 30 | Fill #1

## 2019-08-01 MED FILL — ALPRAZolam 0.5 MG TABS: 0.5 | 30 days supply | Qty: 30 | Fill #1

## 2019-08-06 DIAGNOSIS — L700 Acne vulgaris: Secondary | ICD-10-CM | POA: Diagnosis not present

## 2019-08-06 MED FILL — MYORISAN 30 MG CAPSULE: 30 | 30 days supply | Qty: 60 | Fill #0

## 2019-08-06 MED FILL — TRIAMCINOLONE 0.5% CREAM: 0.5 | 7 days supply | Qty: 15 | Fill #0

## 2019-08-29 MED FILL — FLUCONAZOLE 100 MG TABLET: 100 | 6 days supply | Qty: 6 | Fill #1

## 2019-08-29 MED FILL — ALPRAZolam 2 MG TABS: 2 | 30 days supply | Qty: 30 | Fill #2

## 2019-08-29 MED FILL — VALACYCLOVIR HCL 500 MG TAB: 500 | 30 days supply | Qty: 30 | Fill #4

## 2019-08-29 MED FILL — ALPRAZolam 0.5 MG TABS: 0.5 | 30 days supply | Qty: 30 | Fill #2

## 2019-08-29 MED FILL — metroNIDAZOLE 0.75 % GEL: 0.75 | 5 days supply | Qty: 70 | Fill #1

## 2019-08-29 MED FILL — DEXILANT DR 30 MG CAPSULE: 30 | 90 days supply | Qty: 90 | Fill #0

## 2019-09-03 DIAGNOSIS — L700 Acne vulgaris: Secondary | ICD-10-CM | POA: Diagnosis not present

## 2019-09-03 MED FILL — TRIAMCINOLONE 0.1% CREAM: 0.1 | 21 days supply | Qty: 80 | Fill #1

## 2019-09-03 MED FILL — MYORISAN 30 MG CAPSULE: 30 | 30 days supply | Qty: 60 | Fill #0

## 2019-09-10 MED FILL — AMITRIPTYLINE HCL 75 MG TAB: 75 | 90 days supply | Qty: 90 | Fill #1

## 2019-09-30 MED FILL — VALACYCLOVIR HCL 500 MG TAB: 500 | 30 days supply | Qty: 30 | Fill #5

## 2019-09-30 MED FILL — ALPRAZolam 2 MG TABS: 2 | 30 days supply | Qty: 30 | Fill #3

## 2019-09-30 MED FILL — medroxyPROGESTERone ACETATE: 150 | 84 days supply | Qty: 1 | Fill #1

## 2019-09-30 MED FILL — ALPRAZolam 0.5 MG TABS: 0.5 | 30 days supply | Qty: 30 | Fill #3

## 2019-10-01 MED FILL — MYORISAN 40 MG CAPSULE: 40 | 30 days supply | Qty: 60 | Fill #0

## 2019-10-20 MED FILL — VALACYCLOVIR HCL 500 MG TAB: 500 | 90 days supply | Qty: 180 | Fill #0

## 2019-10-21 MED FILL — MUPIROCIN 2% OINTMENT: 2 | 30 days supply | Qty: 66 | Fill #1

## 2019-10-21 MED FILL — CLINDAMYCIN PH 1% GEL: 1 | 30 days supply | Qty: 30 | Fill #1

## 2019-10-21 MED FILL — TRIAMCINOLONE 0.1% CREAM: 0.1 | 21 days supply | Qty: 80 | Fill #2

## 2019-10-30 MED FILL — VALACYCLOVIR HCL 500 MG TAB: 500 | 90 days supply | Qty: 180 | Fill #0

## 2019-10-30 MED FILL — TRIAMCINOLONE 0.1% CREAM: 0.1 | 21 days supply | Qty: 80 | Fill #2

## 2019-11-03 MED FILL — DEXILANT DR 60 MG CAPSULE: 60 | 90 days supply | Qty: 90 | Fill #0

## 2019-11-03 MED FILL — ALPRAZolam 2 MG TABS: 2 | 30 days supply | Qty: 30 | Fill #0

## 2019-11-04 MED FILL — ALPRAZolam 0.5 MG TABS: 0.5 | 30 days supply | Qty: 30 | Fill #0

## 2019-11-05 MED FILL — MUPIROCIN 2% OINTMENT: 2 | 90 days supply | Qty: 176 | Fill #1

## 2019-11-05 MED FILL — CLINDAMYCIN PH 1% GEL: 1 | 90 days supply | Qty: 90 | Fill #1

## 2019-11-05 MED FILL — CLINDAMYCIN PH 1% SOLUTION: 1 | 30 days supply | Qty: 60 | Fill #0

## 2019-11-05 MED FILL — metroNIDAZOLE 0.75 % GEL: 0.75 | 5 days supply | Qty: 70 | Fill #1

## 2019-11-11 MED FILL — NYSTATIN 100,000 UNITS/ML S: 100000 | 14 days supply | Qty: 210 | Fill #0

## 2019-11-12 MED FILL — MYORISAN 40 MG CAPSULE: 40 | 30 days supply | Qty: 60 | Fill #0

## 2019-11-17 MED FILL — LINZESS 72 MCG CAPSULE: 72 | 30 days supply | Qty: 30 | Fill #1

## 2019-11-18 MED FILL — TRIAMCINOLONE 0.1% CREAM: 0.1 | 30 days supply | Qty: 80 | Fill #0

## 2019-11-22 ENCOUNTER — Telehealth: Payer: 59 | Admitting: Nurse Practitioner

## 2019-11-22 DIAGNOSIS — K219 Gastro-esophageal reflux disease without esophagitis: Secondary | ICD-10-CM

## 2019-11-22 NOTE — Progress Notes (Signed)
Based on what you shared with me it looks like you have severe reflux,that should be evaluated in a face to face office visit. This is nothing else I can give you in an evist that will help you. If what you have been doing OTC is effective , then I suggest that you continue that throug the weekend and speak with your PCP on Monday. I am so sorry that I could not be anymore assistance to you.    NOTE: If you entered your credit card information for this eVisit, you will not be charged. You may see a "hold" on your card for the $35 but that hold will drop off and you will not have a charge processed.  If you are having a true medical emergency please call 911.     For an urgent face to face visit, [facility] has four urgent care centers for your convenience:   . [facility] Center    [phone]                  Get Driving Directions  [address] [place], [place] [address] . 10 am to 8 pm Monday-Friday . 12 pm to 8 pm Saturday-Sunday   . [facility] Health Urgent Care at [facility]  [phone]                  Get Driving Directions  [address] [place], [place] [address] . 8 am to 8 pm Monday-Friday . 9 am to 6 pm Saturday . 11 am to 6 pm Sunday   . [facility] Health Urgent Care at [facility]  [phone]                  Get Driving Directions   [address] [place], [place] [address] . 8 am to 8 pm Monday-Friday . 8 am to 4 pm Saturday-Sunday    . [facility] Health Urgent Care at [facility] Directions  [phone]  [address] [place], [place] [address]  . Monday-Friday, 12 PM to 6 PM    Your e-visit answers were reviewed by a board certified advanced clinical practitioner to complete your personal care plan.  Thank you for using e-Visits.

## 2019-11-25 ENCOUNTER — Encounter: Payer: Self-pay | Admitting: Internal Medicine

## 2019-11-26 MED FILL — SUCRALFATE 1 GM TABLET: 1 | 30 days supply | Qty: 90 | Fill #0

## 2019-12-03 MED FILL — METOPROLOL TARTRATE 25 MG T: 25 | 90 days supply | Qty: 180 | Fill #1

## 2019-12-03 MED FILL — AMITRIPTYLINE HCL 75 MG TAB: 75 | 90 days supply | Qty: 90 | Fill #0

## 2019-12-04 MED FILL — ALPRAZolam 0.5 MG TABS: 0.5 | 30 days supply | Qty: 30 | Fill #1

## 2019-12-05 MED FILL — SUCRALFATE 1 GM TABLET: 1 | 30 days supply | Qty: 90 | Fill #0

## 2019-12-05 MED FILL — ALPRAZolam 2 MG TABS: 2 | 30 days supply | Qty: 30 | Fill #1

## 2019-12-18 MED FILL — PROCTOZONE-HC 2.5 % CREA: 2.5 | 30 days supply | Qty: 30 | Fill #0

## 2020-01-02 MED FILL — FLUCONAZOLE 100 MG TABLET: 100 | 6 days supply | Qty: 6 | Fill #2

## 2020-01-02 MED FILL — SUCRALFATE 1 GM TABLET: 1 | 30 days supply | Qty: 90 | Fill #1

## 2020-01-02 MED FILL — ALPRAZolam 0.5 MG TABS: 0.5 | 30 days supply | Qty: 30 | Fill #2

## 2020-01-02 MED FILL — LINZESS 72 MCG CAPSULE: 72 | 30 days supply | Qty: 30 | Fill #2

## 2020-01-02 MED FILL — ALPRAZolam 2 MG TABS: 2 | 30 days supply | Qty: 30 | Fill #2

## 2020-01-05 ENCOUNTER — Ambulatory Visit: Payer: 59 | Admitting: Internal Medicine

## 2020-01-06 ENCOUNTER — Ambulatory Visit: Payer: Self-pay

## 2020-01-06 ENCOUNTER — Encounter: Payer: Self-pay | Admitting: Podiatry

## 2020-01-22 MED FILL — VALACYCLOVIR HCL 500 MG TAB: 500 | 90 days supply | Qty: 180 | Fill #1

## 2020-01-22 MED FILL — medroxyPROGESTERone ACETATE: 150 | 84 days supply | Qty: 1 | Fill #2

## 2020-02-04 MED FILL — DEXILANT DR 60 MG CAPSULE: 60 | 90 days supply | Qty: 90 | Fill #0

## 2020-02-04 MED FILL — ALPRAZolam 2 MG TABS: 2 | 30 days supply | Qty: 30 | Fill #3

## 2020-02-04 MED FILL — ALPRAZolam 0.5 MG TABS: 0.5 | 30 days supply | Qty: 30 | Fill #3

## 2020-02-09 NOTE — Progress Notes (Signed)
This encounter was created in error - please disregard.

## 2020-02-25 ENCOUNTER — Other Ambulatory Visit (HOSPITAL_COMMUNITY): Payer: Self-pay | Admitting: Nurse Practitioner

## 2020-03-03 MED FILL — ALPRAZolam 2 MG TABS: 2 | 30 days supply | Qty: 30 | Fill #0

## 2020-03-05 MED FILL — ALPRAZolam 0.5 MG TABS: 0.5 | 30 days supply | Qty: 30 | Fill #0

## 2020-03-16 MED FILL — ALPRAZolam 0.5 MG TABS: 0.5 | 30 days supply | Qty: 30 | Fill #0

## 2020-03-16 MED FILL — ALPRAZolam 2 MG TABS: 2 | 30 days supply | Qty: 30 | Fill #0

## 2020-03-17 MED FILL — FLUCONAZOLE 150 MG TABS: 150 | 2 days supply | Qty: 2 | Fill #0

## 2020-03-17 MED FILL — METHOCARBAMOL 500 MG TABS: 500 | 10 days supply | Qty: 30 | Fill #0

## 2020-03-17 MED FILL — SULFAMETHOXAZOLE-TMP DS TAB: 800-160 | 6 days supply | Qty: 12 | Fill #0

## 2020-03-26 ENCOUNTER — Other Ambulatory Visit (HOSPITAL_COMMUNITY): Payer: Self-pay | Admitting: Nurse Practitioner

## 2020-03-26 MED FILL — DICLOFENAC SOD EC 75 MG TAB: 75 | 10 days supply | Qty: 20 | Fill #0

## 2020-03-26 MED FILL — CLINDAMYCIN PH 1% GEL: 1 | 90 days supply | Qty: 90 | Fill #0

## 2020-03-26 MED FILL — METHOCARBAMOL 500 MG TABS: 500 | 10 days supply | Qty: 30 | Fill #0

## 2020-04-12 MED FILL — METOPROLOL TARTRATE 50 MG T: 50 | 30 days supply | Qty: 60 | Fill #1

## 2020-04-12 MED FILL — NYSTATIN 100,000 UNITS/ML S: 100000 | 14 days supply | Qty: 210 | Fill #1

## 2020-04-12 MED FILL — DICLOFENAC SOD EC 75 MG TAB: 75 | 10 days supply | Qty: 20 | Fill #0

## 2020-04-12 MED FILL — METHOCARBAMOL 500 MG TABS: 500 | 10 days supply | Qty: 30 | Fill #0

## 2020-04-12 MED FILL — CLINDAMYCIN PH 1% GEL: 1 | 45 days supply | Qty: 90 | Fill #0

## 2020-04-12 MED FILL — medroxyPROGESTERone ACETATE: 150 | 84 days supply | Qty: 1 | Fill #3

## 2020-04-26 MED FILL — CLINDAMYCIN PH 1% SOLUTION: 1 | 30 days supply | Qty: 60 | Fill #1

## 2020-04-26 MED FILL — ALPRAZolam 2 MG TABS: 2 | 30 days supply | Qty: 30 | Fill #1

## 2020-04-26 MED FILL — MUPIROCIN 2% OINTMENT: 2 | 88 days supply | Qty: 176 | Fill #0

## 2020-04-26 MED FILL — ALPRAZolam 0.5 MG TABS: 0.5 | 30 days supply | Qty: 30 | Fill #1

## 2020-04-26 MED FILL — VALACYCLOVIR HCL 500 MG TAB: 500 | 90 days supply | Qty: 180 | Fill #2

## 2020-04-26 MED FILL — DEXILANT DR 60 MG CAPSULE: 60 | 90 days supply | Qty: 90 | Fill #1

## 2020-05-20 MED FILL — NYSTATIN 100,000 UNITS/ML S: 100000 | 14 days supply | Qty: 210 | Fill #2

## 2020-05-20 MED FILL — LINZESS 72 MCG CAPSULE: 72 | 90 days supply | Qty: 90 | Fill #1

## 2020-05-20 MED FILL — MUPIROCIN 2% OINTMENT: 2 | 88 days supply | Qty: 176 | Fill #0

## 2020-05-20 MED FILL — METOPROLOL TARTRATE 50 MG T: 50 | 30 days supply | Qty: 60 | Fill #2

## 2020-05-20 MED FILL — FLUCONAZOLE 100 MG TABLET: 100 | 6 days supply | Qty: 6 | Fill #3

## 2020-05-20 MED FILL — CLINDAMYCIN PH 1% SOLUTION: 1 | 30 days supply | Qty: 60 | Fill #2

## 2020-05-31 MED FILL — ALPRAZolam 2 MG TABS: 2 | 30 days supply | Qty: 30 | Fill #2

## 2020-05-31 MED FILL — valACYclovir HCL 1 GM TABS: 1 | 90 days supply | Qty: 90 | Fill #0

## 2020-05-31 MED FILL — ALPRAZolam 0.5 MG TABS: 0.5 | 30 days supply | Qty: 30 | Fill #2

## 2020-06-11 MED FILL — AMITRIPTYLINE HCL 75 MG TAB: 75 | 90 days supply | Qty: 90 | Fill #1

## 2020-07-05 MED FILL — ALPRAZolam 2 MG TABS: 2 | 30 days supply | Qty: 30 | Fill #3

## 2020-07-05 MED FILL — ALPRAZolam 0.5 MG TABS: 0.5 | 30 days supply | Qty: 30 | Fill #3

## 2020-07-06 MED FILL — FLUCONAZOLE 100 MG TABLET: 100 | 6 days supply | Qty: 6 | Fill #0

## 2020-07-06 MED FILL — medroxyPROGESTERone ACETATE: 150 | 90 days supply | Qty: 1 | Fill #0

## 2020-07-12 MED FILL — METOPROLOL TARTRATE 50 MG T: 50 | 30 days supply | Qty: 60 | Fill #3

## 2020-08-05 ENCOUNTER — Ambulatory Visit: Payer: Self-pay | Attending: Internal Medicine

## 2020-08-05 DIAGNOSIS — Z23 Encounter for immunization: Secondary | ICD-10-CM

## 2020-08-05 MED FILL — ALPRAZOLAM 2 MG TABS: 2 | 30 days supply | Qty: 30 | Fill #4

## 2020-08-05 MED FILL — ALPRAZolam 0.5 MG TABS: 0.5 | 30 days supply | Qty: 30 | Fill #4

## 2020-08-05 MED FILL — METOPROLOL TARTRATE 50 MG T: 50 | 30 days supply | Qty: 60 | Fill #4

## 2020-08-05 MED FILL — FLUCONAZOLE 100 MG TABLET: 100 | 6 days supply | Qty: 6 | Fill #1

## 2020-08-05 NOTE — Progress Notes (Signed)
   Covid-19 Vaccination Clinic  Name:  CALIAH KOPKE    MRN: [id number] DOB: Jan 20, 1981  08/05/2020  Ms. Hor was observed post Covid-19 immunization for 15 minutes without incident. She was provided with Vaccine Information Sheet and instruction to access the V-Safe system.   Ms. Finlayson was instructed to call 911 with any severe reactions post vaccine: Marland Kitchen Difficulty breathing  . Swelling of face and throat  . A fast heartbeat  . A bad rash all over body  . Dizziness and weakness   Immunizations Administered    Name Date Dose VIS Date Route   Pfizer COVID-19 Vaccine 08/05/2020  4:19 PM 0.3 mL 07/07/2020 Intramuscular   Manufacturer: [employer], [employer]   Lot: [id number]   NDC: [address]-2

## 2020-09-07 MED FILL — AMITRIPTYLINE HCL 75 MG TAB: 75 | 30 days supply | Qty: 30 | Fill #2

## 2020-09-07 MED FILL — MUPIROCIN 2% OINTMENT: 2 | 22 days supply | Qty: 44 | Fill #1

## 2020-09-07 MED FILL — FLUCONAZOLE 100 MG TABLET: 100 | 6 days supply | Qty: 6 | Fill #2

## 2020-09-20 ENCOUNTER — Other Ambulatory Visit (HOSPITAL_COMMUNITY): Payer: Self-pay | Admitting: Nurse Practitioner

## 2020-09-21 MED FILL — ALPRAZolam 0.5 MG TABS: 0.5 | 30 days supply | Qty: 30 | Fill #0

## 2020-09-26 ENCOUNTER — Other Ambulatory Visit (HOSPITAL_COMMUNITY): Payer: Self-pay | Admitting: Nurse Practitioner

## 2020-09-27 MED FILL — METOPROLOL TARTRATE 50 MG T: 50 | 30 days supply | Qty: 60 | Fill #0

## 2020-09-27 MED FILL — ALPRAZOLAM 2 MG TABS: 2 | 30 days supply | Qty: 30 | Fill #0

## 2020-09-27 MED FILL — CYCLOBENZAPRINE HCL 10 MG T: 10 | 30 days supply | Qty: 90 | Fill #0

## 2020-09-27 MED FILL — CLINDAMYCIN PHOSPHATE 1 % S: 1 | 30 days supply | Qty: 60 | Fill #0

## 2020-09-29 ENCOUNTER — Other Ambulatory Visit (HOSPITAL_COMMUNITY): Payer: Self-pay | Admitting: Obstetrics and Gynecology

## 2020-09-29 MED FILL — PROCTOZONE-HC 2.5 % CREA: 2.5 | 14 days supply | Qty: 30 | Fill #0

## 2020-10-07 MED FILL — AMITRIPTYLINE HCL 75 MG TAB: 75 | 30 days supply | Qty: 30 | Fill #3

## 2020-10-08 ENCOUNTER — Other Ambulatory Visit (HOSPITAL_COMMUNITY): Payer: Self-pay | Admitting: Nurse Practitioner

## 2020-10-08 MED FILL — NYSTATIN 100000 UNIT/ML SUS: 100000 | 14 days supply | Qty: 210 | Fill #0

## 2020-10-26 MED FILL — valACYclovir HCL 1 GM TABS: 1 | 90 days supply | Qty: 90 | Fill #1

## 2020-10-26 MED FILL — ALPRAZolam 0.5 MG TABS: 0.5 | 30 days supply | Qty: 30 | Fill #1

## 2020-10-26 MED FILL — PROCTOZONE-HC 2.5 % CREA: 2.5 | 14 days supply | Qty: 30 | Fill #1

## 2020-11-08 ENCOUNTER — Other Ambulatory Visit (HOSPITAL_COMMUNITY): Payer: Self-pay | Admitting: Nurse Practitioner

## 2020-11-08 MED FILL — ALPRAZOLAM 2 MG TABS: 2 | 30 days supply | Qty: 30 | Fill #0

## 2020-11-08 MED FILL — AMITRIPTYLINE HCL 75 MG TAB: 75 | 30 days supply | Qty: 30 | Fill #4

## 2020-12-14 ENCOUNTER — Other Ambulatory Visit (HOSPITAL_COMMUNITY): Payer: Self-pay | Admitting: Nurse Practitioner

## 2020-12-14 MED FILL — ALPRAZOLAM 2 MG TABS: 2 | 30 days supply | Qty: 30 | Fill #0

## 2021-01-04 ENCOUNTER — Other Ambulatory Visit (HOSPITAL_COMMUNITY): Payer: Self-pay

## 2021-01-04 MED ORDER — CLINDAMYCIN PHOSPHATE 2 % VA CREA
1.0000 | TOPICAL_CREAM | Freq: Every day | VAGINAL | 0 refills | Status: DC
  Filled 2021-01-04: qty 40, 10d supply, fill #0
  Filled 2021-05-19: qty 40, 7d supply, fill #0

## 2021-01-04 MED FILL — Amitriptyline HCl Tab 75 MG: ORAL | 30 days supply | Qty: 30 | Fill #0 | Status: AC

## 2021-01-04 MED FILL — Alprazolam Tab 0.5 MG: ORAL | 30 days supply | Qty: 30 | Fill #0 | Status: AC

## 2021-01-05 ENCOUNTER — Other Ambulatory Visit (HOSPITAL_COMMUNITY): Payer: Self-pay

## 2021-01-06 ENCOUNTER — Other Ambulatory Visit (HOSPITAL_COMMUNITY): Payer: Self-pay

## 2021-01-06 MED ORDER — ALPRAZOLAM 2 MG PO TABS
2.0000 mg | ORAL_TABLET | Freq: Every day | ORAL | 3 refills | Status: DC
  Filled 2021-01-06 – 2021-01-17 (×3): qty 30, 30d supply, fill #0
  Filled 2021-02-07 – 2021-02-15 (×2): qty 30, 30d supply, fill #1
  Filled 2021-03-18: qty 30, 30d supply, fill #2
  Filled 2021-04-19: qty 30, 30d supply, fill #3

## 2021-01-06 MED ORDER — ALPRAZOLAM 0.5 MG PO TABS
0.5000 mg | ORAL_TABLET | Freq: Every day | ORAL | 3 refills | Status: DC
  Filled 2021-01-06 – 2021-02-07 (×2): qty 30, 30d supply, fill #0
  Filled 2021-03-07: qty 30, 30d supply, fill #1
  Filled 2021-04-19: qty 30, 30d supply, fill #2
  Filled 2021-06-16: qty 30, 30d supply, fill #3

## 2021-01-07 ENCOUNTER — Other Ambulatory Visit (HOSPITAL_COMMUNITY): Payer: Self-pay

## 2021-01-07 MED FILL — Nystatin Susp 100000 Unit/ML: OROMUCOSAL | 14 days supply | Qty: 210 | Fill #0 | Status: AC

## 2021-01-10 ENCOUNTER — Other Ambulatory Visit (HOSPITAL_COMMUNITY): Payer: Self-pay

## 2021-01-11 ENCOUNTER — Other Ambulatory Visit (HOSPITAL_COMMUNITY): Payer: Self-pay

## 2021-01-11 MED ORDER — NYSTATIN 100000 UNIT/ML MT SUSP
OROMUCOSAL | 3 refills | Status: DC
  Filled 2021-01-11 – 2021-05-09 (×2): qty 210, 14d supply, fill #0
  Filled 2021-06-15: qty 210, 14d supply, fill #1
  Filled 2021-08-16: qty 210, 14d supply, fill #2

## 2021-01-11 MED ORDER — FLUCONAZOLE 150 MG PO TABS
150.0000 mg | ORAL_TABLET | Freq: Every day | ORAL | 0 refills | Status: DC
  Filled 2021-01-11: qty 10, 10d supply, fill #0

## 2021-01-11 MED ORDER — AMITRIPTYLINE HCL 75 MG PO TABS
75.0000 mg | ORAL_TABLET | Freq: Every day | ORAL | 1 refills | Status: DC
  Filled 2021-01-11: qty 90, 90d supply, fill #0
  Filled 2021-03-09: qty 30, 30d supply, fill #0
  Filled 2021-03-19: qty 30, 30d supply, fill #1

## 2021-01-11 MED ORDER — METOPROLOL TARTRATE 50 MG PO TABS
50.0000 mg | ORAL_TABLET | Freq: Two times a day (BID) | ORAL | 5 refills | Status: DC
  Filled 2021-01-11: qty 60, 30d supply, fill #0
  Filled 2021-07-13: qty 60, 30d supply, fill #1
  Filled 2021-08-16: qty 60, 30d supply, fill #2

## 2021-01-13 ENCOUNTER — Other Ambulatory Visit (HOSPITAL_COMMUNITY): Payer: Self-pay

## 2021-01-17 ENCOUNTER — Other Ambulatory Visit (HOSPITAL_COMMUNITY): Payer: Self-pay

## 2021-01-17 MED FILL — Valacyclovir HCl Tab 1 GM: ORAL | 30 days supply | Qty: 30 | Fill #0 | Status: AC

## 2021-02-07 ENCOUNTER — Other Ambulatory Visit (HOSPITAL_COMMUNITY): Payer: Self-pay

## 2021-02-07 MED FILL — Amitriptyline HCl Tab 75 MG: ORAL | 30 days supply | Qty: 30 | Fill #0 | Status: AC

## 2021-02-15 ENCOUNTER — Other Ambulatory Visit (HOSPITAL_COMMUNITY): Payer: Self-pay

## 2021-02-15 MED ORDER — HYDROCORTISONE (PERIANAL) 2.5 % EX CREA
1.0000 "application " | TOPICAL_CREAM | Freq: Every day | CUTANEOUS | 1 refills | Status: AC
  Filled 2021-02-15: qty 30, 30d supply, fill #0
  Filled 2021-04-19: qty 30, 30d supply, fill #1

## 2021-03-02 ENCOUNTER — Other Ambulatory Visit (HOSPITAL_COMMUNITY): Payer: Self-pay

## 2021-03-03 ENCOUNTER — Other Ambulatory Visit (HOSPITAL_COMMUNITY): Payer: Self-pay

## 2021-03-04 ENCOUNTER — Other Ambulatory Visit (HOSPITAL_COMMUNITY): Payer: Self-pay

## 2021-03-07 ENCOUNTER — Other Ambulatory Visit (HOSPITAL_COMMUNITY): Payer: Self-pay

## 2021-03-09 ENCOUNTER — Other Ambulatory Visit (HOSPITAL_COMMUNITY): Payer: Self-pay

## 2021-03-10 ENCOUNTER — Other Ambulatory Visit (HOSPITAL_COMMUNITY): Payer: Self-pay

## 2021-03-10 MED ORDER — AMITRIPTYLINE HCL 75 MG PO TABS
75.0000 mg | ORAL_TABLET | Freq: Every day | ORAL | 1 refills | Status: DC
  Filled 2021-03-10: qty 90, 90d supply, fill #0
  Filled 2021-05-09: qty 30, 30d supply, fill #0
  Filled 2021-06-15: qty 30, 30d supply, fill #1
  Filled 2021-07-22: qty 30, 30d supply, fill #2
  Filled 2021-08-16: qty 30, 30d supply, fill #3
  Filled 2021-09-21: qty 30, 30d supply, fill #4
  Filled 2022-01-12: qty 30, 30d supply, fill #5

## 2021-03-10 MED ORDER — VALACYCLOVIR HCL 1 G PO TABS
1000.0000 mg | ORAL_TABLET | Freq: Every morning | ORAL | 1 refills | Status: DC
  Filled 2021-03-10: qty 30, 30d supply, fill #0
  Filled 2021-05-09: qty 30, 30d supply, fill #1
  Filled 2021-07-13: qty 30, 30d supply, fill #2
  Filled 2021-07-19: qty 30, 30d supply, fill #3
  Filled 2021-08-16: qty 30, 30d supply, fill #4

## 2021-03-11 ENCOUNTER — Other Ambulatory Visit (HOSPITAL_COMMUNITY): Payer: Self-pay

## 2021-03-15 ENCOUNTER — Other Ambulatory Visit (HOSPITAL_COMMUNITY): Payer: Self-pay

## 2021-03-18 ENCOUNTER — Other Ambulatory Visit (HOSPITAL_COMMUNITY): Payer: Self-pay

## 2021-03-19 ENCOUNTER — Other Ambulatory Visit (HOSPITAL_COMMUNITY): Payer: Self-pay

## 2021-03-24 ENCOUNTER — Other Ambulatory Visit (HOSPITAL_COMMUNITY): Payer: Self-pay

## 2021-03-24 MED ORDER — CYCLOBENZAPRINE HCL 10 MG PO TABS
10.0000 mg | ORAL_TABLET | Freq: Three times a day (TID) | ORAL | 1 refills | Status: DC | PRN
  Filled 2021-03-28: qty 90, 30d supply, fill #0

## 2021-03-28 ENCOUNTER — Other Ambulatory Visit (HOSPITAL_COMMUNITY): Payer: Self-pay

## 2021-03-29 ENCOUNTER — Other Ambulatory Visit (HOSPITAL_COMMUNITY): Payer: Self-pay

## 2021-04-05 ENCOUNTER — Other Ambulatory Visit (HOSPITAL_COMMUNITY): Payer: Self-pay

## 2021-04-19 ENCOUNTER — Other Ambulatory Visit (HOSPITAL_COMMUNITY): Payer: Self-pay

## 2021-04-20 ENCOUNTER — Other Ambulatory Visit (HOSPITAL_COMMUNITY): Payer: Self-pay

## 2021-04-21 ENCOUNTER — Other Ambulatory Visit (HOSPITAL_COMMUNITY): Payer: Self-pay

## 2021-04-22 ENCOUNTER — Other Ambulatory Visit (HOSPITAL_COMMUNITY): Payer: Self-pay

## 2021-05-09 ENCOUNTER — Other Ambulatory Visit (HOSPITAL_COMMUNITY): Payer: Self-pay

## 2021-05-11 ENCOUNTER — Other Ambulatory Visit (HOSPITAL_COMMUNITY): Payer: Self-pay

## 2021-05-12 ENCOUNTER — Other Ambulatory Visit (HOSPITAL_COMMUNITY): Payer: Self-pay

## 2021-05-12 MED ORDER — ALPRAZOLAM 0.5 MG PO TABS
0.5000 mg | ORAL_TABLET | Freq: Every day | ORAL | 0 refills | Status: DC
  Filled 2021-05-12 – 2021-05-19 (×2): qty 30, 30d supply, fill #0

## 2021-05-12 MED ORDER — ALPRAZOLAM 2 MG PO TABS
2.0000 mg | ORAL_TABLET | Freq: Every day | ORAL | 0 refills | Status: DC
  Filled 2021-05-12 – 2021-05-19 (×2): qty 30, 30d supply, fill #0

## 2021-05-12 MED ORDER — FLUCONAZOLE 150 MG PO TABS
150.0000 mg | ORAL_TABLET | Freq: Every day | ORAL | 0 refills | Status: DC
  Filled 2021-05-12: qty 10, 10d supply, fill #0

## 2021-05-16 ENCOUNTER — Other Ambulatory Visit (HOSPITAL_COMMUNITY): Payer: Self-pay

## 2021-05-19 ENCOUNTER — Other Ambulatory Visit (HOSPITAL_COMMUNITY): Payer: Self-pay

## 2021-06-15 ENCOUNTER — Other Ambulatory Visit (HOSPITAL_COMMUNITY): Payer: Self-pay

## 2021-06-16 ENCOUNTER — Other Ambulatory Visit (HOSPITAL_COMMUNITY): Payer: Self-pay

## 2021-06-17 ENCOUNTER — Other Ambulatory Visit (HOSPITAL_COMMUNITY): Payer: Self-pay

## 2021-06-17 MED ORDER — ALPRAZOLAM 0.5 MG PO TABS
0.5000 mg | ORAL_TABLET | Freq: Every day | ORAL | 0 refills | Status: DC
  Filled 2021-06-17 – 2021-07-19 (×2): qty 30, 30d supply, fill #0

## 2021-06-17 MED ORDER — ALPRAZOLAM 2 MG PO TABS
2.0000 mg | ORAL_TABLET | Freq: Every day | ORAL | 0 refills | Status: DC
  Filled 2021-06-17: qty 30, 30d supply, fill #0

## 2021-07-13 ENCOUNTER — Other Ambulatory Visit (HOSPITAL_COMMUNITY): Payer: Self-pay

## 2021-07-19 ENCOUNTER — Other Ambulatory Visit (HOSPITAL_COMMUNITY): Payer: Self-pay

## 2021-07-21 ENCOUNTER — Other Ambulatory Visit (HOSPITAL_COMMUNITY): Payer: Self-pay

## 2021-07-22 ENCOUNTER — Other Ambulatory Visit (HOSPITAL_COMMUNITY): Payer: Self-pay

## 2021-07-25 ENCOUNTER — Other Ambulatory Visit (HOSPITAL_COMMUNITY): Payer: Self-pay

## 2021-07-26 ENCOUNTER — Other Ambulatory Visit (HOSPITAL_COMMUNITY): Payer: Self-pay

## 2021-07-26 MED ORDER — ALPRAZOLAM 2 MG PO TABS
2.0000 mg | ORAL_TABLET | Freq: Every day | ORAL | 0 refills | Status: DC
  Filled 2021-07-26: qty 30, 30d supply, fill #0

## 2021-08-16 ENCOUNTER — Other Ambulatory Visit (HOSPITAL_COMMUNITY): Payer: Self-pay

## 2021-08-17 ENCOUNTER — Other Ambulatory Visit (HOSPITAL_COMMUNITY): Payer: Self-pay

## 2021-08-23 ENCOUNTER — Other Ambulatory Visit (HOSPITAL_COMMUNITY): Payer: Self-pay

## 2021-08-23 MED ORDER — AMITRIPTYLINE HCL 75 MG PO TABS
75.0000 mg | ORAL_TABLET | Freq: Every day | ORAL | 1 refills | Status: DC
  Filled 2021-10-20 – 2021-10-21 (×2): qty 30, 30d supply, fill #0
  Filled 2021-11-21: qty 30, 30d supply, fill #1
  Filled 2021-12-21: qty 30, 30d supply, fill #2
  Filled 2022-05-09: qty 30, 30d supply, fill #3
  Filled 2022-08-02: qty 30, 30d supply, fill #4

## 2021-08-23 MED ORDER — ALPRAZOLAM 0.5 MG PO TABS
0.5000 mg | ORAL_TABLET | Freq: Every day | ORAL | 3 refills | Status: DC
  Filled 2021-08-23: qty 30, 30d supply, fill #0
  Filled 2021-09-21: qty 30, 30d supply, fill #1
  Filled 2021-10-21: qty 30, 30d supply, fill #2
  Filled 2021-11-23: qty 30, 30d supply, fill #3

## 2021-08-23 MED ORDER — ALPRAZOLAM 2 MG PO TABS
2.0000 mg | ORAL_TABLET | Freq: Every day | ORAL | 3 refills | Status: DC
  Filled 2021-08-23: qty 30, 30d supply, fill #0
  Filled 2021-09-21: qty 30, 30d supply, fill #1
  Filled 2021-10-21: qty 30, 30d supply, fill #2
  Filled 2021-11-21: qty 30, 30d supply, fill #3

## 2021-08-23 MED ORDER — NYSTATIN 100000 UNIT/ML MT SUSP
5.0000 mL | Freq: Three times a day (TID) | OROMUCOSAL | 3 refills | Status: DC
  Filled 2021-08-23: qty 210, 14d supply, fill #0
  Filled 2022-01-19: qty 210, 14d supply, fill #1
  Filled 2022-05-09 – 2022-06-09 (×2): qty 210, 14d supply, fill #2
  Filled 2022-07-04: qty 210, 14d supply, fill #3

## 2021-08-23 MED ORDER — METOPROLOL TARTRATE 50 MG PO TABS
50.0000 mg | ORAL_TABLET | Freq: Two times a day (BID) | ORAL | 5 refills | Status: DC
  Filled 2021-12-21: qty 60, 30d supply, fill #0
  Filled 2022-01-12: qty 60, 30d supply, fill #1
  Filled 2022-08-18: qty 60, 30d supply, fill #2

## 2021-08-23 MED ORDER — FLUCONAZOLE 150 MG PO TABS
150.0000 mg | ORAL_TABLET | Freq: Every day | ORAL | 0 refills | Status: DC
  Filled 2021-08-23: qty 12, 12d supply, fill #0

## 2021-08-23 MED ORDER — CLINDAMYCIN PHOSPHATE 1 % EX GEL
1.0000 "application " | Freq: Two times a day (BID) | CUTANEOUS | 4 refills | Status: DC
  Filled 2021-08-23 – 2021-11-21 (×2): qty 30, 15d supply, fill #0
  Filled 2022-01-12: qty 30, 15d supply, fill #1
  Filled 2022-08-18: qty 30, 15d supply, fill #2

## 2021-08-23 MED ORDER — VALACYCLOVIR HCL 1 G PO TABS
1000.0000 mg | ORAL_TABLET | Freq: Every morning | ORAL | 1 refills | Status: DC
  Filled 2021-09-06: qty 90, 90d supply, fill #0
  Filled 2021-11-23: qty 30, 30d supply, fill #1
  Filled 2022-01-12: qty 30, 30d supply, fill #2
  Filled 2022-02-14: qty 30, 30d supply, fill #3

## 2021-08-23 MED ORDER — DEXLANSOPRAZOLE 60 MG PO CPDR
60.0000 mg | DELAYED_RELEASE_CAPSULE | Freq: Every day | ORAL | 5 refills | Status: DC
  Filled 2021-08-23 – 2022-08-04 (×5): qty 90, 90d supply, fill #0

## 2021-08-25 ENCOUNTER — Other Ambulatory Visit (HOSPITAL_COMMUNITY): Payer: Self-pay

## 2021-09-06 ENCOUNTER — Other Ambulatory Visit (HOSPITAL_COMMUNITY): Payer: Self-pay

## 2021-09-07 ENCOUNTER — Other Ambulatory Visit (HOSPITAL_COMMUNITY): Payer: Self-pay

## 2021-09-21 ENCOUNTER — Other Ambulatory Visit (HOSPITAL_COMMUNITY): Payer: Self-pay

## 2021-10-12 ENCOUNTER — Other Ambulatory Visit (HOSPITAL_COMMUNITY): Payer: Self-pay

## 2021-10-15 ENCOUNTER — Other Ambulatory Visit (HOSPITAL_COMMUNITY): Payer: Self-pay

## 2021-10-20 ENCOUNTER — Ambulatory Visit: Payer: Self-pay

## 2021-10-20 ENCOUNTER — Other Ambulatory Visit (HOSPITAL_COMMUNITY): Payer: Self-pay

## 2021-10-21 ENCOUNTER — Other Ambulatory Visit (HOSPITAL_COMMUNITY): Payer: Self-pay

## 2021-11-16 ENCOUNTER — Other Ambulatory Visit (HOSPITAL_COMMUNITY): Payer: Self-pay

## 2021-11-21 ENCOUNTER — Other Ambulatory Visit (HOSPITAL_COMMUNITY): Payer: Self-pay

## 2021-11-23 ENCOUNTER — Other Ambulatory Visit (HOSPITAL_COMMUNITY): Payer: Self-pay

## 2021-11-29 ENCOUNTER — Other Ambulatory Visit (HOSPITAL_COMMUNITY): Payer: Self-pay

## 2021-12-07 ENCOUNTER — Other Ambulatory Visit (HOSPITAL_COMMUNITY): Payer: Self-pay

## 2021-12-07 MED ORDER — DEXLANSOPRAZOLE 60 MG PO CPDR
60.0000 mg | DELAYED_RELEASE_CAPSULE | Freq: Every day | ORAL | 5 refills | Status: DC
  Filled 2021-12-07: qty 30, 30d supply, fill #0

## 2021-12-07 MED ORDER — LINZESS 72 MCG PO CAPS
72.0000 ug | ORAL_CAPSULE | Freq: Every day | ORAL | 2 refills | Status: DC
  Filled 2021-12-07: qty 30, 30d supply, fill #0

## 2021-12-09 ENCOUNTER — Other Ambulatory Visit (HOSPITAL_COMMUNITY): Payer: Self-pay

## 2021-12-21 ENCOUNTER — Other Ambulatory Visit (HOSPITAL_COMMUNITY): Payer: Self-pay

## 2021-12-22 ENCOUNTER — Other Ambulatory Visit (HOSPITAL_COMMUNITY): Payer: Self-pay

## 2021-12-23 ENCOUNTER — Other Ambulatory Visit (HOSPITAL_COMMUNITY): Payer: Self-pay

## 2021-12-26 ENCOUNTER — Other Ambulatory Visit (HOSPITAL_COMMUNITY): Payer: Self-pay

## 2021-12-27 ENCOUNTER — Other Ambulatory Visit (HOSPITAL_COMMUNITY): Payer: Self-pay

## 2021-12-27 MED ORDER — LINZESS 72 MCG PO CAPS
72.0000 ug | ORAL_CAPSULE | Freq: Every day | ORAL | 2 refills | Status: DC
  Filled 2021-12-27 – 2022-07-04 (×2): qty 90, 90d supply, fill #0

## 2021-12-27 MED ORDER — ALPRAZOLAM 0.5 MG PO TABS
0.5000 mg | ORAL_TABLET | Freq: Every day | ORAL | 3 refills | Status: DC
  Filled 2022-01-06: qty 30, 30d supply, fill #0

## 2021-12-30 ENCOUNTER — Other Ambulatory Visit (HOSPITAL_COMMUNITY): Payer: Self-pay

## 2021-12-30 MED ORDER — LINZESS 72 MCG PO CAPS
72.0000 ug | ORAL_CAPSULE | Freq: Every day | ORAL | 2 refills | Status: DC
  Filled 2021-12-30 – 2022-01-12 (×2): qty 90, 90d supply, fill #0
  Filled 2022-06-09: qty 90, 90d supply, fill #1
  Filled 2022-07-04 (×2): qty 90, 90d supply, fill #2

## 2022-01-04 ENCOUNTER — Other Ambulatory Visit (HOSPITAL_COMMUNITY): Payer: Self-pay

## 2022-01-06 ENCOUNTER — Other Ambulatory Visit (HOSPITAL_COMMUNITY): Payer: Self-pay

## 2022-01-09 ENCOUNTER — Other Ambulatory Visit (HOSPITAL_COMMUNITY): Payer: Self-pay

## 2022-01-11 ENCOUNTER — Other Ambulatory Visit (HOSPITAL_COMMUNITY): Payer: Self-pay

## 2022-01-11 MED ORDER — ALPRAZOLAM 2 MG PO TABS
2.0000 mg | ORAL_TABLET | Freq: Every evening | ORAL | 3 refills | Status: DC
  Filled 2022-01-11: qty 30, 30d supply, fill #0
  Filled 2022-02-14: qty 30, 30d supply, fill #1
  Filled 2022-04-08 – 2022-07-03 (×2): qty 30, 30d supply, fill #2

## 2022-01-12 ENCOUNTER — Other Ambulatory Visit (HOSPITAL_COMMUNITY): Payer: Self-pay

## 2022-01-13 ENCOUNTER — Other Ambulatory Visit (HOSPITAL_COMMUNITY): Payer: Self-pay

## 2022-01-16 ENCOUNTER — Other Ambulatory Visit (HOSPITAL_COMMUNITY): Payer: Self-pay

## 2022-01-16 MED ORDER — ALPRAZOLAM 1 MG PO TABS
1.0000 mg | ORAL_TABLET | Freq: Every morning | ORAL | 3 refills | Status: DC
  Filled 2022-01-16 – 2022-01-25 (×3): qty 30, 30d supply, fill #0
  Filled 2022-03-14: qty 30, 30d supply, fill #1
  Filled 2022-04-08: qty 30, 30d supply, fill #2
  Filled 2022-05-09: qty 30, 30d supply, fill #3

## 2022-01-16 MED ORDER — METOPROLOL TARTRATE 50 MG PO TABS
50.0000 mg | ORAL_TABLET | Freq: Two times a day (BID) | ORAL | 5 refills | Status: DC
  Filled 2022-01-16 – 2022-03-14 (×2): qty 60, 30d supply, fill #0
  Filled 2022-04-08: qty 60, 30d supply, fill #1
  Filled 2022-09-07 – 2022-10-05 (×3): qty 60, 30d supply, fill #2
  Filled 2022-10-31: qty 60, 30d supply, fill #3
  Filled 2022-11-28: qty 60, 30d supply, fill #4

## 2022-01-16 MED ORDER — DEXLANSOPRAZOLE 60 MG PO CPDR
60.0000 mg | DELAYED_RELEASE_CAPSULE | Freq: Every day | ORAL | 5 refills | Status: DC
  Filled 2022-01-16 – 2022-08-15 (×5): qty 90, 90d supply, fill #0

## 2022-01-16 MED ORDER — AMITRIPTYLINE HCL 75 MG PO TABS
75.0000 mg | ORAL_TABLET | Freq: Every day | ORAL | 1 refills | Status: DC
  Filled 2022-01-16: qty 80, 80d supply, fill #0
  Filled 2022-02-14: qty 10, 10d supply, fill #0
  Filled 2022-02-14: qty 80, 80d supply, fill #0
  Filled 2022-04-08: qty 90, 90d supply, fill #1
  Filled 2022-04-18: qty 30, 30d supply, fill #1
  Filled 2022-06-09: qty 30, 30d supply, fill #2
  Filled 2022-07-03: qty 30, 30d supply, fill #3

## 2022-01-16 MED ORDER — ALPRAZOLAM 2 MG PO TABS
2.0000 mg | ORAL_TABLET | Freq: Every day | ORAL | 3 refills | Status: DC
  Filled 2022-01-16 – 2022-03-14 (×3): qty 30, 30d supply, fill #0
  Filled 2022-04-08: qty 30, 30d supply, fill #1
  Filled 2022-05-09: qty 30, 30d supply, fill #2
  Filled 2022-06-09: qty 30, 30d supply, fill #3

## 2022-01-16 MED ORDER — NITROGLYCERIN 0.4 MG SL SUBL
0.4000 mg | SUBLINGUAL_TABLET | Freq: Every day | SUBLINGUAL | 1 refills | Status: DC | PRN
  Filled 2022-01-16: qty 25, 25d supply, fill #0
  Filled 2022-07-03: qty 25, 25d supply, fill #1

## 2022-01-17 ENCOUNTER — Other Ambulatory Visit (HOSPITAL_COMMUNITY): Payer: Self-pay

## 2022-01-18 ENCOUNTER — Other Ambulatory Visit (HOSPITAL_COMMUNITY): Payer: Self-pay

## 2022-01-19 ENCOUNTER — Other Ambulatory Visit (HOSPITAL_COMMUNITY): Payer: Self-pay

## 2022-01-19 MED ORDER — AMOXICILLIN 875 MG PO TABS
875.0000 mg | ORAL_TABLET | Freq: Two times a day (BID) | ORAL | 0 refills | Status: DC
  Filled 2022-01-19: qty 20, 10d supply, fill #0

## 2022-01-19 MED ORDER — FLUCONAZOLE 150 MG PO TABS
150.0000 mg | ORAL_TABLET | Freq: Every day | ORAL | 0 refills | Status: DC
  Filled 2022-01-19: qty 2, 2d supply, fill #0

## 2022-01-20 ENCOUNTER — Other Ambulatory Visit (HOSPITAL_COMMUNITY): Payer: Self-pay

## 2022-01-20 MED ORDER — CLINDAMYCIN PHOSPHATE 1 % EX GEL
1.0000 "application " | Freq: Two times a day (BID) | CUTANEOUS | 4 refills | Status: DC
  Filled 2022-01-23: qty 30, 15d supply, fill #0
  Filled 2022-06-09 – 2022-07-03 (×2): qty 30, 15d supply, fill #1
  Filled 2022-09-07 – 2022-10-31 (×6): qty 30, 15d supply, fill #2
  Filled 2022-11-22 – 2022-12-05 (×2): qty 30, 15d supply, fill #3

## 2022-01-20 MED ORDER — HYDROCORTISONE (PERIANAL) 2.5 % EX CREA
1.0000 "application " | TOPICAL_CREAM | Freq: Four times a day (QID) | CUTANEOUS | 3 refills | Status: DC
  Filled 2022-01-20: qty 30, 5d supply, fill #0
  Filled 2022-04-08: qty 30, 5d supply, fill #1
  Filled 2022-07-03: qty 30, 5d supply, fill #2
  Filled 2022-08-02: qty 30, 5d supply, fill #3

## 2022-01-23 ENCOUNTER — Other Ambulatory Visit (HOSPITAL_COMMUNITY): Payer: Self-pay

## 2022-01-24 ENCOUNTER — Other Ambulatory Visit (HOSPITAL_COMMUNITY): Payer: Self-pay

## 2022-01-25 ENCOUNTER — Other Ambulatory Visit (HOSPITAL_COMMUNITY): Payer: Self-pay

## 2022-02-07 ENCOUNTER — Other Ambulatory Visit (HOSPITAL_COMMUNITY): Payer: Self-pay

## 2022-02-07 MED ORDER — PAXLOVID (300/100) 20 X 150 MG & 10 X 100MG PO TBPK
3.0000 | ORAL_TABLET | Freq: Two times a day (BID) | ORAL | 0 refills | Status: DC
  Filled 2022-02-07: qty 30, 5d supply, fill #0

## 2022-02-07 MED ORDER — METHYLPREDNISOLONE 4 MG PO TBPK
ORAL_TABLET | ORAL | 0 refills | Status: DC
  Filled 2022-02-07: qty 21, 6d supply, fill #0

## 2022-02-08 ENCOUNTER — Other Ambulatory Visit (HOSPITAL_COMMUNITY): Payer: Self-pay

## 2022-02-14 ENCOUNTER — Other Ambulatory Visit (HOSPITAL_COMMUNITY): Payer: Self-pay

## 2022-02-15 ENCOUNTER — Other Ambulatory Visit (HOSPITAL_COMMUNITY): Payer: Self-pay

## 2022-03-02 ENCOUNTER — Other Ambulatory Visit (HOSPITAL_COMMUNITY): Payer: Self-pay

## 2022-03-13 ENCOUNTER — Encounter (HOSPITAL_COMMUNITY): Payer: Self-pay | Admitting: Emergency Medicine

## 2022-03-13 ENCOUNTER — Emergency Department (HOSPITAL_COMMUNITY): Payer: Self-pay

## 2022-03-13 ENCOUNTER — Emergency Department (HOSPITAL_COMMUNITY)
Admission: EM | Admit: 2022-03-13 | Discharge: 2022-03-13 | Disposition: A | Discharge status: Home / Self Care | Payer: Self-pay | Attending: Student | Admitting: Student

## 2022-03-13 ENCOUNTER — Encounter: Payer: Self-pay | Admitting: Family Medicine

## 2022-03-13 ENCOUNTER — Emergency Department (INDEPENDENT_AMBULATORY_CARE_PROVIDER_SITE_OTHER)
Admission: EM | Admit: 2022-03-13 | Discharge: 2022-03-13 | Disposition: A | Discharge status: Home / Self Care | Payer: Self-pay | Source: Home / Self Care

## 2022-03-13 DIAGNOSIS — R0602 Shortness of breath: Secondary | ICD-10-CM | POA: Insufficient documentation

## 2022-03-13 DIAGNOSIS — R002 Palpitations: Secondary | ICD-10-CM | POA: Insufficient documentation

## 2022-03-13 DIAGNOSIS — R0789 Other chest pain: Secondary | ICD-10-CM

## 2022-03-13 LAB — BASIC METABOLIC PANEL
Anion gap: 5 (ref 5–15)
BUN: 6 mg/dL (ref 6–20)
CO2: 28 mmol/L (ref 22–32)
Calcium: 9 mg/dL (ref 8.9–10.3)
Chloride: 107 mmol/L (ref 98–111)
Creatinine, Ser: 0.85 mg/dL (ref 0.44–1.00)
GFR, Estimated: >60 mL/min (ref >60)
Glucose, Bld: 98 mg/dL (ref 70–99)
Potassium: 3.4 mmol/L — ABNORMAL LOW (ref 3.5–5.1)
Sodium: 140 mmol/L (ref 135–145)

## 2022-03-13 LAB — CBC
HCT: 36.6 % (ref 36.0–46.0)
Hemoglobin: 12.1 g/dL (ref 12.0–15.0)
MCH: 31.5 pg (ref 26.0–34.0)
MCHC: 33.1 g/dL (ref 30.0–36.0)
MCV: 95.3 fL (ref 80.0–100.0)
Platelets: 215 10*3/uL (ref 150–400)
RBC: 3.84 MIL/uL — ABNORMAL LOW (ref 3.87–5.11)
RDW: 12.4 % (ref 11.5–15.5)
WBC: 6.2 10*3/uL (ref 4.0–10.5)
nRBC: 0 % (ref 0.0–0.2)

## 2022-03-13 LAB — I-STAT BETA HCG BLOOD, ED (MC, WL, AP ONLY): I-stat hCG, quantitative: <5 m[IU]/mL (ref <5)

## 2022-03-13 LAB — TROPONIN I (HIGH SENSITIVITY): Troponin I (High Sensitivity): <2 ng/L (ref <18)

## 2022-03-13 MED ORDER — POTASSIUM CHLORIDE CRYS ER 20 MEQ PO TBCR
40.0000 meq | EXTENDED_RELEASE_TABLET | Freq: Once | ORAL | Status: AC
  Administered 2022-03-13: 40 meq via ORAL
  Filled 2022-03-13: qty 2

## 2022-03-13 MED ORDER — LIDOCAINE 5 % EX PTCH
1.0000 | MEDICATED_PATCH | CUTANEOUS | Status: DC
Start: 2022-03-13 — End: 2022-03-14
  Administered 2022-03-13: 1 via TRANSDERMAL
  Filled 2022-03-13: qty 1

## 2022-03-13 MED ORDER — LIDOCAINE 5 % EX PTCH
1.0000 | MEDICATED_PATCH | CUTANEOUS | 0 refills | Status: DC
  Filled 2022-03-13 – 2022-07-03 (×3): qty 30, 30d supply, fill #0

## 2022-03-13 MED ORDER — NAPROXEN 500 MG PO TABS
500.0000 mg | ORAL_TABLET | Freq: Once | ORAL | Status: AC
  Administered 2022-03-13: 500 mg via ORAL
  Filled 2022-03-13: qty 1

## 2022-03-13 MED ORDER — MAGNESIUM OXIDE -MG SUPPLEMENT 400 (240 MG) MG PO TABS
800.0000 mg | ORAL_TABLET | Freq: Once | ORAL | Status: AC
  Administered 2022-03-13: 800 mg via ORAL
  Filled 2022-03-13: qty 2

## 2022-03-13 NOTE — ED Provider Notes (Signed)
[facility]    CSN: [id number] Arrival date & time: 03/13/22  1844      History   Chief Complaint Chief Complaint  Patient presents with   Palpitations   Chest Pain    HPI Christina [name] is a 41 y.o. female.   HPI 41 year old female presents with intermittent chest pain and numbness and tingling to left arm for 1 week.  Patient reports palpitations/tachycardia today and shortness of breath with exertion.  Patient reports she was nauseated earlier this morning. PMH significant for anxiety, pleuritic pain and GERD.  Patient reports that she is a Engineer, civil (consulting) and that her daughter dropped her off for this visit.  Past Medical History:  Diagnosis Date   Abnormal Pap smear    BV (bacterial vaginosis)    GERD (gastroesophageal reflux disease)    Migraine     Patient Active Problem List   Diagnosis Date Noted   Migraine with aura 12/04/2012   Cephalgia 12/04/2012    Past Surgical History:  Procedure Laterality Date   CESAREAN SECTION     DENTAL SURGERY      OB History     Gravida  4   Para  2   Term  2   Preterm      AB  2   Living  2      SAB  2   IAB      Ectopic      Multiple      Live Births               Home Medications    Prior to Admission medications   Medication Sig Start Date End Date Taking? Authorizing Provider  ALPRAZolam Prudy Feeler) 0.5 MG tablet Take 1 tablet (0.5 mg total) by mouth daily. 12/14/20     ALPRAZolam (XANAX) 0.5 MG tablet TAKE 1 TABLET BY MOUTH DAILY 01/06/21     ALPRAZolam (XANAX) 0.5 MG tablet Take 1 tablet (0.5 mg total) by mouth daily. 05/12/21     ALPRAZolam (XANAX) 1 MG tablet Take 1 mg by mouth 2 (two) times daily as needed for anxiety.    [provider]  ALPRAZolam Prudy Feeler) 1 MG tablet Take 1 tablet (1 mg total) by mouth in the morning. 01/16/22     alprazolam (XANAX) 2 MG tablet Take 1 tablet (2 mg total) by mouth at bedtime. 01/11/22     alprazolam (XANAX) 2 MG tablet Take 1 tablet (2 mg total)  by mouth at bedtime. 01/16/22     amitriptyline (ELAVIL) 75 MG tablet Take 1 tablet (75 mg total) by mouth at bedtime. 07/09/17   Junie Spencer, FNP  amitriptyline (ELAVIL) 75 MG tablet TAKE 1 TABLET EVERY DAY BY MOUTH AS DIRECTED 09/26/20 09/26/21  Diamantina Providence, FNP  amitriptyline (ELAVIL) 75 MG tablet Take 1 tablet (75 mg total) by mouth daily as directed. 02/25/20   Diamantina Providence, FNP  amitriptyline (ELAVIL) 75 MG tablet Take 1 tablet (75 mg total) by mouth daily as directed. 01/11/21     amitriptyline (ELAVIL) 75 MG tablet Take 1 tablet (75 mg total) by mouth daily as directed. 03/10/21     amitriptyline (ELAVIL) 75 MG tablet Take 1 tablet (75 mg total) by mouth daily. 08/23/21     amitriptyline (ELAVIL) 75 MG tablet Take 1 tablet (75 mg total) by mouth daily as directed 01/16/22     amoxicillin (AMOXIL) 875 MG tablet Take 1 tablet (875 mg total) by mouth every 12 (  twelve) hours. 01/19/22     cephALEXin (KEFLEX) 500 MG capsule Take 1 capsule (500 mg total) by mouth 2 (two) times daily. Patient not taking: Reported on 11/01/2018 10/25/17   Withrow, Everardo All, FNP  clindamycin (CLEOCIN) 2 % vaginal cream Insert 1 applicatorful every day by vaginal route as directed for 10 days. 01/04/21     clindamycin (CLINDAGEL) 1 % gel Apply 1 application topically 2 (two) times daily as directed 08/23/21     clindamycin (CLINDAGEL) 1 % gel Apply 1 application topically 2 (two) times daily. 01/20/22     cyclobenzaprine (FLEXERIL) 10 MG tablet TAKE 1 TABLET BY MOUTH THREE TIMES DAILY AS NEEDED 03/24/21     Dexlansoprazole (DEXILANT) 30 MG capsule Take 30 mg by mouth daily.    [provider]  dexlansoprazole (DEXILANT) 60 MG capsule TAKE 1 CAPSULE BY MOUTH EVERY DAY AS DIRECTED. 12/14/20 12/14/21  Diamantina Providence, FNP  dexlansoprazole (DEXILANT) 60 MG capsule TAKE 1 CAPSULE BY MOUTH DAILY AS DIRECTED 09/26/20 09/26/21  Diamantina Providence, FNP  dexlansoprazole (DEXILANT) 60 MG capsule Take 1 capsule (60 mg total) by  mouth daily. 08/23/21     dexlansoprazole (DEXILANT) 60 MG capsule Take 1 capsule (60 mg) by mouth daily as directed 12/07/21     dexlansoprazole (DEXILANT) 60 MG capsule Take 1 capsule (60 mg total) by mouth daily as directed 01/16/22     dicyclomine (BENTYL) 20 MG tablet Take 1 tablet (20 mg total) by mouth 2 (two) times daily. 11/01/18   Arthor Captain, PA-C  fluconazole (DIFLUCAN) 150 MG tablet Take 1 tablet (150 mg total) by mouth daily as directed. 01/11/21     fluconazole (DIFLUCAN) 150 MG tablet TAKE 1 TABLET BY MOUTH ONCE DAILY AS DIRECTED. 05/12/21     fluconazole (DIFLUCAN) 150 MG tablet Take 1 tablet (150 mg total) by mouth daily. 08/23/21     fluconazole (DIFLUCAN) 150 MG tablet Take 1 tablet (150 mg total) by mouth daily once as directed. 01/19/22     hydrocortisone (ANUSOL-HC) 2.5 % rectal cream Apply 1 application topically to the affected area(s) daily. 02/15/21     hydrocortisone (PROCTOZONE-HC) 2.5 % rectal cream Apply 1 application topically 2-4 times daily. 01/20/22     linaclotide (LINZESS) 72 MCG capsule Take 72 mcg by mouth daily before breakfast.    [provider]  linaclotide (LINZESS) 72 MCG capsule Take 1 capsule (72 mcg total) by mouth daily as directed 12/07/21     linaclotide (LINZESS) 72 MCG capsule Take 1 capsule (72 mcg total) by mouth daily as directed 12/27/21     linaclotide (LINZESS) 72 MCG capsule Take 1 capsule (72 mcg total) by mouth daily as directed 12/29/21     methylPREDNISolone (MEDROL) 4 MG TBPK tablet Take by mouth as directed on package. 02/07/22     metoprolol tartrate (LOPRESSOR) 25 MG tablet Take 1 tablet (25 mg total) by mouth 2 (two) times daily. 07/09/17   Jannifer Rodney A, FNP  metoprolol tartrate (LOPRESSOR) 50 MG tablet TAKE 1 TABLET BY MOUTH 2 TIMES A DAY AS DIRECTED 09/26/20 09/26/21  Diamantina Providence, FNP  metoprolol tartrate (LOPRESSOR) 50 MG tablet TAKE 1 TABLET BY MOUTH 2 TIMES A DAY AS DIRECTED 02/25/20 02/24/21  Diamantina Providence, FNP   metoprolol tartrate (LOPRESSOR) 50 MG tablet TAKE 1 TABLET BY MOUTH TWICE DAILY AS DIRECTED. 12/14/20 12/14/21  Diamantina Providence, FNP  metoprolol tartrate (LOPRESSOR) 50 MG tablet Take 1 tablet (50 mg total) by mouth  2 (two) times daily as directed. 01/11/21     metoprolol tartrate (LOPRESSOR) 50 MG tablet Take 1 tablet (50 mg total) by mouth 2 (two) times daily as directed 08/23/21     metoprolol tartrate (LOPRESSOR) 50 MG tablet Take 1 tablet (50 mg total) by mouth 2 (two) times daily as directed 01/16/22     nirmatrelvir & ritonavir (PAXLOVID, 300/100,) 20 x 150 MG & 10 x 100MG  TBPK Take 3 tablets by mouth 2 (two) times daily as directed for 5 days. 02/07/22     nitroGLYCERIN (NITROSTAT) 0.4 MG SL tablet Place 1 tablet (0.4 mg total) under the tongue daily as needed. 01/16/22     nystatin (MYCOSTATIN) 100000 UNIT/ML suspension Swish and spit 5 mLs by mouth 3 times daily. 01/11/21     nystatin (MYCOSTATIN) 100000 UNIT/ML suspension Swish and spit 5 mLs (500,000 Units total) by mouth 3 (three) times daily. 08/23/21     ondansetron (ZOFRAN) 4 MG tablet Take 1 tablet (4 mg total) by mouth every 8 (eight) hours as needed for nausea or vomiting. 11/01/18   Arthor Captain, PA-C  ranitidine (ZANTAC) 150 MG tablet Take 1 tablet (150 mg total) by mouth 2 (two) times daily. Patient not taking: Reported on 11/01/2018 07/09/17   Junie Spencer, FNP  valACYclovir (VALTREX) 1000 MG tablet Take 1 tablet (1,000 mg total) by mouth in the morning. 03/10/21     valACYclovir (VALTREX) 1000 MG tablet Take 1 tablet (1,000 mg total) by mouth in the morning. 08/23/21       Family History Family History  Problem Relation Age of Onset   Hypertension Mother    Stroke Mother    Heart attack Mother    Cancer Father    Hypertension Father    Migraines Father     Social History Social History   Tobacco Use   Smoking status: Never   Smokeless tobacco: Never  Vaping Use   Vaping Use: Never used  Substance Use Topics    Alcohol use: No   Drug use: No     Allergies   Patient has no known allergies.   Review of Systems Review of Systems  Respiratory:  Positive for shortness of breath.   Cardiovascular:  Positive for chest pain.  All other systems reviewed and are negative.    Physical Exam Triage Vital Signs ED Triage Vitals  Enc Vitals Group     BP      Pulse      Resp      Temp      Temp src      SpO2      Weight      Height      Head Circumference      Peak Flow      Pain Score      Pain Loc      Pain Edu?      Excl. in GC?    No data found.  Updated Vital Signs BP 117/77 (BP Location: Right Arm)   Pulse 99   Temp 99.2 F (37.3 C) (Oral)   Resp 20   Ht 5\' 3"  (1.6 m)   Wt 137 lb (62.1 kg)   SpO2 99%   BMI 24.27 kg/m    Physical Exam Vitals and nursing note reviewed.  Constitutional:      Appearance: Normal appearance. She is normal weight.  HENT:     Head: Normocephalic and atraumatic.     Mouth/Throat:  Mouth: Mucous membranes are moist.     Pharynx: Oropharynx is clear.  Eyes:     Extraocular Movements: Extraocular movements intact.     Conjunctiva/sclera: Conjunctivae normal.     Pupils: Pupils are equal, round, and reactive to light.  Neck:     Comments: No JVD, no Bruit Cardiovascular:     Rate and Rhythm: Normal rate and regular rhythm.     Pulses: Normal pulses.     Heart sounds: Normal heart sounds.  Pulmonary:     Effort: Pulmonary effort is normal.     Breath sounds: No wheezing, rhonchi or rales.  Musculoskeletal:     Cervical back: Normal range of motion and neck supple.  Skin:    General: Skin is warm and dry.  Neurological:     General: No focal deficit present.     Mental Status: She is alert and oriented to person, place, and time.      UC Treatments / Results  Labs (all labs ordered are listed, but only abnormal results are displayed) Labs Reviewed - No data to display  EKG   Radiology No results  found.  Procedures Procedures (including critical care time)  Medications Ordered in UC Medications - No data to display  Initial Impression / Assessment and Plan / UC Course  I have reviewed the triage vital signs and the nursing notes.  Pertinent labs & imaging results that were available during my care of the patient were reviewed by me and considered in my medical decision making (see chart for details).     MDM: 1.  Atypical chest pain-EKG revealed NSR.  Instructed patient to go to [facility] now for further evaluation of chest pain to include cardiac enzymes and imaging.  Patient agreed and verbalized understanding of these instructions and this plan of care today.  Patient's daughter will be driving her to local emergency department. Final Clinical Impressions(s) / UC Diagnoses   Final diagnoses:  Atypical chest pain     Discharge Instructions      Advised/instructed patient to go to [facility] now for further evaluation of chest pain to include cardiac enzymes and imaging.     ED Prescriptions   None    PDMP not reviewed this encounter.   Trevor Iha, FNP 03/13/22 [phone]

## 2022-03-13 NOTE — ED Notes (Signed)
Save blue tube in main lab °

## 2022-03-14 ENCOUNTER — Other Ambulatory Visit (HOSPITAL_COMMUNITY): Payer: Self-pay

## 2022-03-14 NOTE — ED Provider Notes (Signed)
[facility]-EMERGENCY DEPT Provider Note  CSN: [id number] Arrival date & time: 03/13/22 1953  Chief Complaint(s) Palpitations  HPI Christina [name] is a 41 y.o. female who presents emergency department for evaluation of palpitations, chest pain and exertional shortness of breath.  Patient states that for the last 1 week she has had these intermittent palpitations and this morning received an EKG at work that showed the patient was in A-fib with RVR.  Patient is a Engineer, civil (consulting) and does not have any documentation of this being the case.  She was seen at urgent care who also did not find the patient to be in A-fib but transferred her to the emergency department for cardiac enzyme testing given intermittent chest pain.  She states that the pain is sharp, nonradiating, not associated with diaphoresis, nausea or vomiting.  She states that the symptoms are currently not occurring while here in the ER.   Past Medical History Past Medical History:  Diagnosis Date   Abnormal Pap smear    BV (bacterial vaginosis)    GERD (gastroesophageal reflux disease)    Migraine    Patient Active Problem List   Diagnosis Date Noted   Migraine with aura 12/04/2012   Cephalgia 12/04/2012   Home Medication(s) Prior to Admission medications   Medication Sig Start Date End Date Taking? Authorizing Provider  lidocaine (LIDODERM) 5 % Place 1 patch onto the skin daily. Remove & Discard patch within 12 hours or as directed by MD 03/13/22  Yes Mckenzee Beem, MD  ALPRAZolam Prudy Feeler) 0.5 MG tablet Take 1 tablet (0.5 mg total) by mouth daily. 12/14/20     ALPRAZolam (XANAX) 0.5 MG tablet TAKE 1 TABLET BY MOUTH DAILY 01/06/21     ALPRAZolam (XANAX) 0.5 MG tablet Take 1 tablet (0.5 mg total) by mouth daily. 05/12/21     ALPRAZolam (XANAX) 1 MG tablet Take 1 mg by mouth 2 (two) times daily as needed for anxiety.    [provider]  ALPRAZolam Prudy Feeler) 1 MG tablet Take 1 tablet (1 mg total) by mouth in the  morning. 01/16/22     alprazolam (XANAX) 2 MG tablet Take 1 tablet (2 mg total) by mouth at bedtime. 01/11/22     alprazolam (XANAX) 2 MG tablet Take 1 tablet (2 mg total) by mouth at bedtime. 01/16/22     amitriptyline (ELAVIL) 75 MG tablet Take 1 tablet (75 mg total) by mouth at bedtime. 07/09/17   Junie Spencer, FNP  amitriptyline (ELAVIL) 75 MG tablet TAKE 1 TABLET EVERY DAY BY MOUTH AS DIRECTED 09/26/20 09/26/21  Diamantina Providence, FNP  amitriptyline (ELAVIL) 75 MG tablet Take 1 tablet (75 mg total) by mouth daily as directed. 02/25/20   Diamantina Providence, FNP  amitriptyline (ELAVIL) 75 MG tablet Take 1 tablet (75 mg total) by mouth daily as directed. 01/11/21     amitriptyline (ELAVIL) 75 MG tablet Take 1 tablet (75 mg total) by mouth daily as directed. 03/10/21     amitriptyline (ELAVIL) 75 MG tablet Take 1 tablet (75 mg total) by mouth daily. 08/23/21     amitriptyline (ELAVIL) 75 MG tablet Take 1 tablet (75 mg total) by mouth daily as directed 01/16/22     amoxicillin (AMOXIL) 875 MG tablet Take 1 tablet (875 mg total) by mouth every 12 (twelve) hours. 01/19/22     cephALEXin (KEFLEX) 500 MG capsule Take 1 capsule (500 mg total) by mouth 2 (two) times daily. Patient not taking: Reported on 11/01/2018 10/25/17  Withrow, Everardo All, FNP  clindamycin (CLEOCIN) 2 % vaginal cream Insert 1 applicatorful every day by vaginal route as directed for 10 days. 01/04/21     clindamycin (CLINDAGEL) 1 % gel Apply 1 application topically 2 (two) times daily as directed 08/23/21     clindamycin (CLINDAGEL) 1 % gel Apply 1 application topically 2 (two) times daily. 01/20/22     cyclobenzaprine (FLEXERIL) 10 MG tablet TAKE 1 TABLET BY MOUTH THREE TIMES DAILY AS NEEDED 03/24/21     Dexlansoprazole (DEXILANT) 30 MG capsule Take 30 mg by mouth daily.    [provider]  dexlansoprazole (DEXILANT) 60 MG capsule TAKE 1 CAPSULE BY MOUTH EVERY DAY AS DIRECTED. 12/14/20 12/14/21  Diamantina Providence, FNP  dexlansoprazole (DEXILANT)  60 MG capsule TAKE 1 CAPSULE BY MOUTH DAILY AS DIRECTED 09/26/20 09/26/21  Diamantina Providence, FNP  dexlansoprazole (DEXILANT) 60 MG capsule Take 1 capsule (60 mg total) by mouth daily. 08/23/21     dexlansoprazole (DEXILANT) 60 MG capsule Take 1 capsule (60 mg) by mouth daily as directed 12/07/21     dexlansoprazole (DEXILANT) 60 MG capsule Take 1 capsule (60 mg total) by mouth daily as directed 01/16/22     dicyclomine (BENTYL) 20 MG tablet Take 1 tablet (20 mg total) by mouth 2 (two) times daily. 11/01/18   Arthor Captain, PA-C  fluconazole (DIFLUCAN) 150 MG tablet Take 1 tablet (150 mg total) by mouth daily as directed. 01/11/21     fluconazole (DIFLUCAN) 150 MG tablet TAKE 1 TABLET BY MOUTH ONCE DAILY AS DIRECTED. 05/12/21     fluconazole (DIFLUCAN) 150 MG tablet Take 1 tablet (150 mg total) by mouth daily. 08/23/21     fluconazole (DIFLUCAN) 150 MG tablet Take 1 tablet (150 mg total) by mouth daily once as directed. 01/19/22     hydrocortisone (ANUSOL-HC) 2.5 % rectal cream Apply 1 application topically to the affected area(s) daily. 02/15/21     hydrocortisone (PROCTOZONE-HC) 2.5 % rectal cream Apply 1 application topically 2-4 times daily. 01/20/22     linaclotide (LINZESS) 72 MCG capsule Take 72 mcg by mouth daily before breakfast.    [provider]  linaclotide (LINZESS) 72 MCG capsule Take 1 capsule (72 mcg total) by mouth daily as directed 12/07/21     linaclotide (LINZESS) 72 MCG capsule Take 1 capsule (72 mcg total) by mouth daily as directed 12/27/21     linaclotide (LINZESS) 72 MCG capsule Take 1 capsule (72 mcg total) by mouth daily as directed 12/29/21     methylPREDNISolone (MEDROL) 4 MG TBPK tablet Take by mouth as directed on package. 02/07/22     metoprolol tartrate (LOPRESSOR) 25 MG tablet Take 1 tablet (25 mg total) by mouth 2 (two) times daily. 07/09/17   Jannifer Rodney A, FNP  metoprolol tartrate (LOPRESSOR) 50 MG tablet TAKE 1 TABLET BY MOUTH 2 TIMES A DAY AS DIRECTED 09/26/20 09/26/21   Diamantina Providence, FNP  metoprolol tartrate (LOPRESSOR) 50 MG tablet TAKE 1 TABLET BY MOUTH 2 TIMES A DAY AS DIRECTED 02/25/20 02/24/21  Diamantina Providence, FNP  metoprolol tartrate (LOPRESSOR) 50 MG tablet TAKE 1 TABLET BY MOUTH TWICE DAILY AS DIRECTED. 12/14/20 12/14/21  Diamantina Providence, FNP  metoprolol tartrate (LOPRESSOR) 50 MG tablet Take 1 tablet (50 mg total) by mouth 2 (two) times daily as directed. 01/11/21     metoprolol tartrate (LOPRESSOR) 50 MG tablet Take 1 tablet (50 mg total) by mouth 2 (two) times daily as directed 08/23/21  metoprolol tartrate (LOPRESSOR) 50 MG tablet Take 1 tablet (50 mg total) by mouth 2 (two) times daily as directed 01/16/22     nirmatrelvir & ritonavir (PAXLOVID, 300/100,) 20 x 150 MG & 10 x 100MG  TBPK Take 3 tablets by mouth 2 (two) times daily as directed for 5 days. 02/07/22     nitroGLYCERIN (NITROSTAT) 0.4 MG SL tablet Place 1 tablet (0.4 mg total) under the tongue daily as needed. 01/16/22     nystatin (MYCOSTATIN) 100000 UNIT/ML suspension Swish and spit 5 mLs by mouth 3 times daily. 01/11/21     nystatin (MYCOSTATIN) 100000 UNIT/ML suspension Swish and spit 5 mLs (500,000 Units total) by mouth 3 (three) times daily. 08/23/21     ondansetron (ZOFRAN) 4 MG tablet Take 1 tablet (4 mg total) by mouth every 8 (eight) hours as needed for nausea or vomiting. 11/01/18   Arthor Captain, PA-C  ranitidine (ZANTAC) 150 MG tablet Take 1 tablet (150 mg total) by mouth 2 (two) times daily. Patient not taking: Reported on 11/01/2018 07/09/17   Junie Spencer, FNP  valACYclovir (VALTREX) 1000 MG tablet Take 1 tablet (1,000 mg total) by mouth in the morning. 03/10/21     valACYclovir (VALTREX) 1000 MG tablet Take 1 tablet (1,000 mg total) by mouth in the morning. 08/23/21                                                                                                                                       Past Surgical History Past Surgical History:  Procedure Laterality Date    CESAREAN SECTION     DENTAL SURGERY     Family History Family History  Problem Relation Age of Onset   Hypertension Mother    Stroke Mother    Heart attack Mother    Cancer Father    Hypertension Father    Migraines Father     Social History Social History   Tobacco Use   Smoking status: Never   Smokeless tobacco: Never  Vaping Use   Vaping Use: Never used  Substance Use Topics   Alcohol use: No   Drug use: No   Allergies Patient has no known allergies.  Review of Systems Review of Systems  Respiratory:  Positive for shortness of breath.   Cardiovascular:  Positive for chest pain and palpitations.    Physical Exam Vital Signs  I have reviewed the triage vital signs BP 93/76   Pulse 69   Temp 98.7 F (37.1 C)   Resp 11   SpO2 100%   Physical Exam Vitals and nursing note reviewed.  Constitutional:      General: She is not in acute distress.    Appearance: She is well-developed.  HENT:     Head: Normocephalic and atraumatic.  Eyes:     Conjunctiva/sclera: Conjunctivae normal.  Cardiovascular:     Rate and Rhythm: Normal rate and regular rhythm.  Heart sounds: No murmur heard. Pulmonary:     Effort: Pulmonary effort is normal. No respiratory distress.     Breath sounds: Normal breath sounds.  Abdominal:     Palpations: Abdomen is soft.     Tenderness: There is no abdominal tenderness.  Musculoskeletal:        General: No swelling.     Cervical back: Neck supple.  Skin:    General: Skin is warm and dry.     Capillary Refill: Capillary refill takes less than 2 seconds.  Neurological:     Mental Status: She is alert.  Psychiatric:        Mood and Affect: Mood normal.     ED Results and Treatments Labs (all labs ordered are listed, but only abnormal results are displayed) Labs Reviewed  BASIC METABOLIC PANEL - Abnormal; Notable for the following components:      Result Value   Potassium 3.4 (*)    All other components within normal limits   CBC - Abnormal; Notable for the following components:   RBC 3.84 (*)    All other components within normal limits  I-STAT BETA HCG BLOOD, ED ([facility], [facility], [facility] ONLY)  TROPONIN I (HIGH SENSITIVITY)                                                                                                                          Radiology DG Chest 2 View  Result Date: 03/13/2022 CLINICAL DATA:  Chest pain EXAM: CHEST - 2 VIEW COMPARISON:  Chest radiograph 09/29/2015 FINDINGS: The cardiomediastinal contours are within normal limits. The lungs are clear. No pneumothorax or pleural effusion. No acute finding in the visualized skeleton. IMPRESSION: No acute cardiopulmonary process. Electronically Signed   By: Emmaline Kluver M.D.   On: 03/13/2022 20:14    Pertinent labs & imaging results that were available during my care of the patient were reviewed by me and considered in my medical decision making (see MDM for details).  Medications Ordered in ED Medications  potassium chloride SA (KLOR-CON M) CR tablet 40 mEq (40 mEq Oral Given 03/13/22 2245)  magnesium oxide (MAG-OX) tablet 800 mg (800 mg Oral Given 03/13/22 2247)  naproxen (NAPROSYN) tablet 500 mg (500 mg Oral Given 03/13/22 2247)  Procedures Procedures  (including critical care time)  Medical Decision Making / ED Course   This patient presents to the ED for concern of palpitations, chest pain this involves an extensive number of treatment options, and is a complaint that carries with it a high risk of complications and morbidity.  The differential diagnosis includes anxiety, ACS, pneumonia, electrolyte abnormality  MDM: Patient seen emergency room for evaluation of palpitations and chest pain.  Physical exam is reveals reproducible palpable tenderness over the chest wall.  Initial ECG with normal sinus rhythm here and  patient is not having any persistent palpitations while in the emergency department laboratory evaluation with mild hypokalemia to 3.4 and this was repleted in the emergency department.  High-sensitivity troponin negative.  Patient is PERC negative and has a heart score of of 1 given her family history of heart disease.  Chest x-ray is unremarkable.  Patient was given Naprosyn and a Lidoderm patch and on reevaluation her chest pain has resolved.  Suspect costochondritis and patient was discharged with outpatient follow-up.  At this time I have low suspicion that the patient was truly in A-fib, we certainly did not see this on cardiac monitoring today, and with a CHA2DS2-VASc of 1, as a female she does not meet criteria for empiric anticoagulation and that she was discharged with outpatient follow-up.   Additional history obtained:  -External records from outside source obtained and reviewed including: Chart review including previous notes, labs, imaging, consultation notes   Lab Tests: -I ordered, reviewed, and interpreted labs.   The pertinent results include:   Labs Reviewed  BASIC METABOLIC PANEL - Abnormal; Notable for the following components:      Result Value   Potassium 3.4 (*)    All other components within normal limits  CBC - Abnormal; Notable for the following components:   RBC 3.84 (*)    All other components within normal limits  I-STAT BETA HCG BLOOD, ED ([facility], [facility], [facility] ONLY)  TROPONIN I (HIGH SENSITIVITY)      EKG   EKG Interpretation  Date/Time:  Monday March 13 2022 19:58:03 EDT Ventricular Rate:  87 PR Interval:  148 QRS Duration: 88 QT Interval:  369 QTC Calculation: 444 R Axis:   86 Text Interpretation: Sinus rhythm Ventricular premature complex Low voltage, precordial leads Confirmed by Kaesen Rodriguez ([id number]) on 03/13/2022 10:03:22 PM         Imaging Studies ordered: I ordered imaging studies including chest x-ray I independently visualized and interpreted  imaging. I agree with the radiologist interpretation   Medicines ordered and prescription drug management: Meds ordered this encounter  Medications   potassium chloride SA (KLOR-CON M) CR tablet 40 mEq   magnesium oxide (MAG-OX) tablet 800 mg   DISCONTD: lidocaine (LIDODERM) 5 % 1 patch   naproxen (NAPROSYN) tablet 500 mg   lidocaine (LIDODERM) 5 %    Sig: Place 1 patch onto the skin daily. Remove & Discard patch within 12 hours or as directed by MD    Dispense:  30 patch    Refill:  0    -I have reviewed the patients home medicines and have made adjustments as needed  Critical interventions none   Cardiac Monitoring: The patient was maintained on a cardiac monitor.  I personally viewed and interpreted the cardiac monitored which showed an underlying rhythm of: NSR  Social Determinants of Health:  Factors impacting patients care include: none   Reevaluation: After the interventions noted above, I reevaluated the patient and  found that they have :improved  Co morbidities that complicate the patient evaluation  Past Medical History:  Diagnosis Date   Abnormal Pap smear    BV (bacterial vaginosis)    GERD (gastroesophageal reflux disease)    Migraine       Dispostion: I considered admission for this patient, but with a heart score of 1 and patient PERC negative with symptoms improved in the ER, patient safe for discharge with outpatient follow-up     Final Clinical Impression(s) / ED Diagnoses Final diagnoses:  Palpitations  Chest wall pain     @PCDICTATION @    Glendora Score, MD 03/14/22 2014

## 2022-03-16 ENCOUNTER — Other Ambulatory Visit (HOSPITAL_COMMUNITY): Payer: Self-pay

## 2022-03-16 MED ORDER — VALACYCLOVIR HCL 1 G PO TABS
1000.0000 mg | ORAL_TABLET | Freq: Every morning | ORAL | 1 refills | Status: DC
  Filled 2022-03-16 – 2022-04-14 (×3): qty 30, 30d supply, fill #0
  Filled 2022-05-09: qty 30, 30d supply, fill #1
  Filled 2022-06-09: qty 30, 30d supply, fill #2
  Filled 2022-07-03: qty 30, 30d supply, fill #3
  Filled 2022-08-02: qty 30, 30d supply, fill #4
  Filled 2022-09-05: qty 30, 30d supply, fill #5

## 2022-03-27 ENCOUNTER — Other Ambulatory Visit (HOSPITAL_COMMUNITY): Payer: Self-pay

## 2022-04-08 ENCOUNTER — Other Ambulatory Visit (HOSPITAL_COMMUNITY): Payer: Self-pay

## 2022-04-10 ENCOUNTER — Other Ambulatory Visit (HOSPITAL_COMMUNITY): Payer: Self-pay

## 2022-04-11 ENCOUNTER — Other Ambulatory Visit (HOSPITAL_COMMUNITY): Payer: Self-pay

## 2022-04-13 ENCOUNTER — Other Ambulatory Visit (HOSPITAL_COMMUNITY): Payer: Self-pay

## 2022-04-14 ENCOUNTER — Other Ambulatory Visit (HOSPITAL_COMMUNITY): Payer: Self-pay

## 2022-04-18 ENCOUNTER — Other Ambulatory Visit (HOSPITAL_COMMUNITY): Payer: Self-pay

## 2022-05-09 ENCOUNTER — Other Ambulatory Visit (HOSPITAL_COMMUNITY): Payer: Self-pay

## 2022-06-09 ENCOUNTER — Other Ambulatory Visit (HOSPITAL_COMMUNITY): Payer: Self-pay

## 2022-06-12 ENCOUNTER — Other Ambulatory Visit (HOSPITAL_COMMUNITY): Payer: Self-pay

## 2022-06-12 MED ORDER — ALPRAZOLAM 1 MG PO TABS
1.0000 mg | ORAL_TABLET | Freq: Every morning | ORAL | 1 refills | Status: DC
  Filled 2022-06-12: qty 30, 30d supply, fill #0
  Filled 2022-07-03 – 2022-08-18 (×2): qty 30, 30d supply, fill #1

## 2022-06-12 MED ORDER — CLINDAMYCIN PHOSPHATE 1 % EX GEL
1.0000 | Freq: Two times a day (BID) | CUTANEOUS | 4 refills | Status: DC
  Filled 2022-06-12: qty 30, 30d supply, fill #0
  Filled 2022-07-03 – 2022-09-07 (×4): qty 30, 15d supply, fill #0

## 2022-06-13 ENCOUNTER — Other Ambulatory Visit (HOSPITAL_COMMUNITY): Payer: Self-pay

## 2022-06-14 ENCOUNTER — Other Ambulatory Visit (HOSPITAL_COMMUNITY): Payer: Self-pay

## 2022-06-20 ENCOUNTER — Other Ambulatory Visit (HOSPITAL_COMMUNITY): Payer: Self-pay

## 2022-07-03 ENCOUNTER — Other Ambulatory Visit (HOSPITAL_COMMUNITY): Payer: Self-pay

## 2022-07-04 ENCOUNTER — Other Ambulatory Visit (HOSPITAL_COMMUNITY): Payer: Self-pay

## 2022-07-06 ENCOUNTER — Other Ambulatory Visit (HOSPITAL_COMMUNITY): Payer: Self-pay

## 2022-07-06 MED ORDER — CLINDAMYCIN PHOSPHATE 1 % EX SOLN
1.0000 | Freq: Two times a day (BID) | CUTANEOUS | 5 refills | Status: DC
  Filled 2022-07-06 – 2022-09-07 (×3): qty 60, 30d supply, fill #0

## 2022-07-11 ENCOUNTER — Other Ambulatory Visit (HOSPITAL_COMMUNITY): Payer: Self-pay

## 2022-07-11 DIAGNOSIS — Z124 Encounter for screening for malignant neoplasm of cervix: Secondary | ICD-10-CM | POA: Diagnosis not present

## 2022-07-11 DIAGNOSIS — I10 Essential (primary) hypertension: Secondary | ICD-10-CM | POA: Diagnosis not present

## 2022-07-11 DIAGNOSIS — F411 Generalized anxiety disorder: Secondary | ICD-10-CM | POA: Diagnosis not present

## 2022-07-11 DIAGNOSIS — K219 Gastro-esophageal reflux disease without esophagitis: Secondary | ICD-10-CM | POA: Diagnosis not present

## 2022-07-11 DIAGNOSIS — R634 Abnormal weight loss: Secondary | ICD-10-CM | POA: Diagnosis not present

## 2022-07-11 DIAGNOSIS — Z1231 Encounter for screening mammogram for malignant neoplasm of breast: Secondary | ICD-10-CM | POA: Diagnosis not present

## 2022-07-11 DIAGNOSIS — R002 Palpitations: Secondary | ICD-10-CM | POA: Diagnosis not present

## 2022-07-11 DIAGNOSIS — E876 Hypokalemia: Secondary | ICD-10-CM | POA: Diagnosis not present

## 2022-07-11 DIAGNOSIS — Z0000 Encounter for general adult medical examination without abnormal findings: Secondary | ICD-10-CM | POA: Diagnosis not present

## 2022-07-11 MED ORDER — FLUCONAZOLE 150 MG PO TABS
150.0000 mg | ORAL_TABLET | Freq: Every day | ORAL | 0 refills | Status: DC
  Filled 2022-07-11: qty 2, 2d supply, fill #0

## 2022-07-11 MED ORDER — SUCRALFATE 1 G PO TABS
1.0000 g | ORAL_TABLET | Freq: Three times a day (TID) | ORAL | 1 refills | Status: DC
  Filled 2022-07-11: qty 90, 30d supply, fill #0
  Filled 2022-08-18: qty 90, 30d supply, fill #1

## 2022-07-11 MED ORDER — LINZESS 72 MCG PO CAPS
72.0000 ug | ORAL_CAPSULE | Freq: Every day | ORAL | 2 refills | Status: AC
  Filled 2022-07-11 – 2022-09-05 (×7): qty 90, 90d supply, fill #0
  Filled 2022-09-20 – 2022-12-05 (×3): qty 90, 90d supply, fill #1

## 2022-07-11 MED ORDER — DEXLANSOPRAZOLE 60 MG PO CPDR
60.0000 mg | DELAYED_RELEASE_CAPSULE | Freq: Every day | ORAL | 5 refills | Status: DC
  Filled 2022-07-11 – 2022-07-12 (×3): qty 90, 90d supply, fill #0
  Filled 2022-07-28: qty 30, 30d supply, fill #0
  Filled 2022-08-02 – 2022-08-17 (×4): qty 90, 90d supply, fill #0

## 2022-07-11 MED ORDER — ALPRAZOLAM 1 MG PO TABS
1.0000 mg | ORAL_TABLET | Freq: Every morning | ORAL | 3 refills | Status: DC
  Filled 2022-07-11: qty 30, 30d supply, fill #0
  Filled 2022-09-20: qty 30, 30d supply, fill #1
  Filled 2022-10-31: qty 30, 30d supply, fill #2
  Filled 2022-11-28: qty 30, 30d supply, fill #3

## 2022-07-11 MED ORDER — ALPRAZOLAM 2 MG PO TABS
2.0000 mg | ORAL_TABLET | Freq: Every evening | ORAL | 3 refills | Status: AC
  Filled 2022-07-11 – 2022-08-02 (×2): qty 30, 30d supply, fill #0
  Filled 2022-09-05: qty 30, 30d supply, fill #1
  Filled 2022-10-05: qty 30, 30d supply, fill #2
  Filled 2022-10-31: qty 30, 30d supply, fill #3
  Filled 2022-11-02: qty 30, 30d supply, fill #0

## 2022-07-12 ENCOUNTER — Other Ambulatory Visit (HOSPITAL_COMMUNITY): Payer: Self-pay

## 2022-07-14 ENCOUNTER — Other Ambulatory Visit (HOSPITAL_COMMUNITY): Payer: Self-pay

## 2022-07-28 ENCOUNTER — Other Ambulatory Visit (HOSPITAL_COMMUNITY): Payer: Self-pay

## 2022-08-02 ENCOUNTER — Other Ambulatory Visit (HOSPITAL_COMMUNITY): Payer: Self-pay

## 2022-08-03 ENCOUNTER — Other Ambulatory Visit (HOSPITAL_COMMUNITY): Payer: Self-pay

## 2022-08-04 ENCOUNTER — Other Ambulatory Visit (HOSPITAL_COMMUNITY): Payer: Self-pay

## 2022-08-04 DIAGNOSIS — Z23 Encounter for immunization: Secondary | ICD-10-CM | POA: Diagnosis not present

## 2022-08-04 DIAGNOSIS — R87810 Cervical high risk human papillomavirus (HPV) DNA test positive: Secondary | ICD-10-CM | POA: Diagnosis not present

## 2022-08-04 DIAGNOSIS — L700 Acne vulgaris: Secondary | ICD-10-CM | POA: Diagnosis not present

## 2022-08-04 DIAGNOSIS — J209 Acute bronchitis, unspecified: Secondary | ICD-10-CM | POA: Diagnosis not present

## 2022-08-04 DIAGNOSIS — H60509 Unspecified acute noninfective otitis externa, unspecified ear: Secondary | ICD-10-CM | POA: Diagnosis not present

## 2022-08-04 MED ORDER — METHYLPREDNISOLONE 4 MG PO TBPK
ORAL_TABLET | ORAL | 0 refills | Status: DC
  Filled 2022-08-04: qty 21, 6d supply, fill #0

## 2022-08-04 MED ORDER — CIPROFLOXACIN-DEXAMETHASONE 0.3-0.1 % OT SUSP
4.0000 [drp] | Freq: Two times a day (BID) | OTIC | 0 refills | Status: DC
  Filled 2022-08-04: qty 7.5, 7d supply, fill #0

## 2022-08-04 MED ORDER — CLINDAMYCIN PHOSPHATE 1 % EX GEL
Freq: Two times a day (BID) | CUTANEOUS | 4 refills | Status: DC
  Filled 2022-08-04: qty 30, 30d supply, fill #0
  Filled 2022-08-18 – 2022-09-20 (×3): qty 30, 30d supply, fill #1

## 2022-08-11 ENCOUNTER — Other Ambulatory Visit (HOSPITAL_COMMUNITY): Payer: Self-pay

## 2022-08-15 ENCOUNTER — Other Ambulatory Visit (HOSPITAL_COMMUNITY): Payer: Self-pay

## 2022-08-17 ENCOUNTER — Other Ambulatory Visit (HOSPITAL_COMMUNITY): Payer: Self-pay

## 2022-08-18 ENCOUNTER — Other Ambulatory Visit (HOSPITAL_COMMUNITY): Payer: Self-pay

## 2022-08-24 ENCOUNTER — Other Ambulatory Visit (HOSPITAL_COMMUNITY): Payer: Self-pay

## 2022-08-25 ENCOUNTER — Other Ambulatory Visit (HOSPITAL_COMMUNITY): Payer: Self-pay

## 2022-08-25 DIAGNOSIS — Z1231 Encounter for screening mammogram for malignant neoplasm of breast: Secondary | ICD-10-CM | POA: Diagnosis not present

## 2022-09-05 ENCOUNTER — Other Ambulatory Visit (HOSPITAL_COMMUNITY): Payer: Self-pay

## 2022-09-05 ENCOUNTER — Ambulatory Visit: Payer: Self-pay | Admitting: Cardiology

## 2022-09-06 ENCOUNTER — Other Ambulatory Visit (HOSPITAL_COMMUNITY): Payer: Self-pay

## 2022-09-06 ENCOUNTER — Other Ambulatory Visit: Payer: Self-pay

## 2022-09-07 ENCOUNTER — Other Ambulatory Visit (HOSPITAL_COMMUNITY): Payer: Self-pay

## 2022-09-07 ENCOUNTER — Other Ambulatory Visit: Payer: Self-pay

## 2022-09-08 ENCOUNTER — Other Ambulatory Visit (HOSPITAL_COMMUNITY): Payer: Self-pay

## 2022-09-08 ENCOUNTER — Ambulatory Visit: Payer: Self-pay | Admitting: Cardiology

## 2022-09-08 MED ORDER — LINZESS 72 MCG PO CAPS
72.0000 ug | ORAL_CAPSULE | Freq: Every day | ORAL | 2 refills | Status: DC
  Filled 2022-09-08 – 2022-09-18 (×2): qty 90, 90d supply, fill #0

## 2022-09-08 MED ORDER — VALACYCLOVIR HCL 1 G PO TABS
1000.0000 mg | ORAL_TABLET | Freq: Every morning | ORAL | 1 refills | Status: DC
  Filled 2022-09-08 – 2022-09-20 (×3): qty 90, 90d supply, fill #0

## 2022-09-08 MED ORDER — AMITRIPTYLINE HCL 75 MG PO TABS
75.0000 mg | ORAL_TABLET | Freq: Every day | ORAL | 1 refills | Status: DC
  Filled 2022-09-08: qty 90, 90d supply, fill #0

## 2022-09-08 MED ORDER — METOPROLOL TARTRATE 50 MG PO TABS
50.0000 mg | ORAL_TABLET | Freq: Two times a day (BID) | ORAL | 5 refills | Status: DC
  Filled 2022-09-08: qty 60, 30d supply, fill #0

## 2022-09-08 MED ORDER — NYSTATIN 100000 UNIT/ML MT SUSP
5.0000 mL | Freq: Three times a day (TID) | OROMUCOSAL | 3 refills | Status: DC
  Filled 2022-09-08: qty 210, 14d supply, fill #0
  Filled 2022-09-20: qty 210, 14d supply, fill #1
  Filled 2022-10-05: qty 210, 14d supply, fill #2
  Filled 2022-10-31: qty 210, 14d supply, fill #3

## 2022-09-08 MED ORDER — NITROGLYCERIN 0.4 MG SL SUBL
0.4000 mg | SUBLINGUAL_TABLET | Freq: Every day | SUBLINGUAL | 1 refills | Status: DC | PRN
  Filled 2022-09-08: qty 25, 15d supply, fill #0

## 2022-09-08 MED ORDER — DEXLANSOPRAZOLE 60 MG PO CPDR
60.0000 mg | DELAYED_RELEASE_CAPSULE | Freq: Every day | ORAL | 5 refills | Status: DC
  Filled 2022-09-08 – 2022-11-22 (×3): qty 90, 90d supply, fill #0

## 2022-09-12 ENCOUNTER — Ambulatory Visit: Payer: 59 | Admitting: Podiatry

## 2022-09-12 ENCOUNTER — Other Ambulatory Visit (HOSPITAL_COMMUNITY): Payer: Self-pay

## 2022-09-12 MED ORDER — NITROGLYCERIN 0.4 MG SL SUBL
0.4000 mg | SUBLINGUAL_TABLET | SUBLINGUAL | 1 refills | Status: DC | PRN
  Filled 2022-09-12: qty 25, 7d supply, fill #0
  Filled 2022-09-20: qty 25, 5d supply, fill #0
  Filled 2022-10-20: qty 25, 10d supply, fill #0
  Filled 2023-01-03: qty 25, 10d supply, fill #1

## 2022-09-12 MED ORDER — NYSTATIN 100000 UNIT/ML MT SUSP: OROMUCOSAL | 3 refills | Status: DC

## 2022-09-12 MED ORDER — METOPROLOL TARTRATE 50 MG PO TABS
ORAL_TABLET | ORAL | 5 refills | Status: DC
  Filled 2022-09-12: qty 60, 30d supply, fill #0

## 2022-09-12 MED ORDER — CIPROFLOXACIN-DEXAMETHASONE 0.3-0.1 % OT SUSP
OTIC | 0 refills | Status: DC
  Filled 2022-09-12: qty 7.5, 10d supply, fill #0

## 2022-09-12 MED ORDER — METOPROLOL TARTRATE 50 MG PO TABS
50.0000 mg | ORAL_TABLET | Freq: Two times a day (BID) | ORAL | 5 refills | Status: DC
  Filled 2022-09-12: qty 60, 30d supply, fill #0

## 2022-09-12 MED ORDER — CLINDAMYCIN PHOSPHATE 1 % EX GEL
CUTANEOUS | 4 refills | Status: DC
  Filled 2022-09-12: qty 30, 20d supply, fill #0

## 2022-09-12 MED ORDER — NYSTATIN 100000 UNIT/ML MT SUSP
OROMUCOSAL | 3 refills | Status: DC
  Filled 2022-09-12: qty 210, 14d supply, fill #0

## 2022-09-12 MED ORDER — HYDROCORTISONE (PERIANAL) 2.5 % EX CREA
TOPICAL_CREAM | CUTANEOUS | 3 refills | Status: DC
  Filled 2022-09-12: qty 30, 20d supply, fill #0
  Filled 2022-09-20: qty 30, 20d supply, fill #1

## 2022-09-12 MED ORDER — NYSTATIN 100000 UNIT/ML MT SUSP
OROMUCOSAL | 3 refills | Status: DC
  Filled 2022-09-12: qty 210, 14d supply, fill #0
  Filled 2022-09-20: qty 210, 10d supply, fill #0

## 2022-09-12 MED ORDER — NITROGLYCERIN 0.4 MG SL SUBL
0.4000 mg | SUBLINGUAL_TABLET | SUBLINGUAL | 1 refills | Status: DC | PRN
  Filled 2022-09-12: qty 25, 8d supply, fill #0

## 2022-09-12 MED ORDER — AMITRIPTYLINE HCL 75 MG PO TABS
ORAL_TABLET | ORAL | 1 refills | Status: DC
  Filled 2022-09-12: qty 90, 90d supply, fill #0
  Filled 2022-09-18: qty 90, fill #0
  Filled 2022-09-20: qty 90, 90d supply, fill #0

## 2022-09-12 MED ORDER — AMITRIPTYLINE HCL 75 MG PO TABS
75.0000 mg | ORAL_TABLET | Freq: Every day | ORAL | 1 refills | Status: DC
  Filled 2022-09-12: qty 90, 90d supply, fill #0

## 2022-09-13 NOTE — Progress Notes (Signed)
No show.   Raun Routh, DO, FACC  Pager: [phone] Office: [phone]  

## 2022-09-14 ENCOUNTER — Other Ambulatory Visit (HOSPITAL_COMMUNITY): Payer: Self-pay

## 2022-09-14 MED ORDER — FLUCONAZOLE 150 MG PO TABS
ORAL_TABLET | ORAL | 1 refills | Status: DC
  Filled 2022-09-14: qty 5, 5d supply, fill #0

## 2022-09-14 MED ORDER — CYCLOBENZAPRINE HCL 10 MG PO TABS
10.0000 mg | ORAL_TABLET | Freq: Three times a day (TID) | ORAL | 1 refills | Status: DC | PRN
  Filled 2022-09-14: qty 30, 10d supply, fill #0

## 2022-09-18 ENCOUNTER — Other Ambulatory Visit (HOSPITAL_COMMUNITY): Payer: Self-pay

## 2022-09-18 ENCOUNTER — Other Ambulatory Visit: Payer: Self-pay

## 2022-09-20 ENCOUNTER — Other Ambulatory Visit (HOSPITAL_COMMUNITY): Payer: Self-pay

## 2022-09-21 ENCOUNTER — Other Ambulatory Visit (HOSPITAL_COMMUNITY): Payer: Self-pay

## 2022-09-22 NOTE — Progress Notes (Signed)
Error.   No Show.   Traye Bates, DO, FACC  Pager: [phone] Office: [phone]  

## 2022-09-26 ENCOUNTER — Encounter: Payer: Self-pay | Admitting: Internal Medicine

## 2022-09-26 ENCOUNTER — Other Ambulatory Visit (HOSPITAL_COMMUNITY): Payer: Self-pay

## 2022-09-26 ENCOUNTER — Ambulatory Visit: Payer: Commercial Managed Care - PPO | Admitting: Internal Medicine

## 2022-09-26 VITALS — BP 101/61 | HR 95 | Ht 63.0 in | Wt 115.0 lb

## 2022-09-26 DIAGNOSIS — R002 Palpitations: Secondary | ICD-10-CM | POA: Diagnosis not present

## 2022-09-26 DIAGNOSIS — I10 Essential (primary) hypertension: Secondary | ICD-10-CM

## 2022-09-26 DIAGNOSIS — R079 Chest pain, unspecified: Secondary | ICD-10-CM

## 2022-09-26 MED ORDER — PROPRANOLOL HCL 10 MG PO TABS
10.0000 mg | ORAL_TABLET | Freq: Three times a day (TID) | ORAL | 3 refills | Status: AC | PRN
  Filled 2022-09-26: qty 90, 30d supply, fill #0
  Filled 2022-12-18 – 2023-01-03 (×2): qty 90, 30d supply, fill #1
  Filled 2023-03-16: qty 90, 30d supply, fill #2

## 2022-09-26 NOTE — Progress Notes (Signed)
Primary Physician/Referring:  Diamantina Providence, FNP  Patient ID: Christina [name], female    DOB: 1980/09/29, 42 y.o.   MRN: [id number]  Chief Complaint  Patient presents with   Palpitations   New Patient (Initial Visit)    Palpitations    HPI:    Christina [name]  is a 42 y.o. female with past medical history significant for anxiety and family history of early heart disease who is here to establish care with cardiology.  Patient states that heart disease runs in both sides of her family and every time she experiences chest pain she becomes very concerned.  She does not think this is due to her anxiety but given her extensive family history she would really like to make sure that her heart is okay.  She admits to constant palpitations and chest pain.  This can happen at rest and with activity, there is no correlation according to the patient.  She denies shortness of breath, diaphoresis, syncope, orthopnea, edema, PND, claudication.  Past Medical History:  Diagnosis Date   Abnormal Pap smear    BV (bacterial vaginosis)    GERD (gastroesophageal reflux disease)    Migraine    Past Surgical History:  Procedure Laterality Date   CESAREAN SECTION     DENTAL SURGERY     Family History  Problem Relation Age of Onset   Arrhythmia Mother    Hypertension Mother    Stroke Mother    Heart attack Mother    Cancer Father    Hypertension Father    Migraines Father     Social History   Tobacco Use   Smoking status: Never   Smokeless tobacco: Never  Substance Use Topics   Alcohol use: No   Marital Status: Single  ROS  Review of Systems  Cardiovascular:  Positive for chest pain, irregular heartbeat and palpitations.   Objective  Blood pressure 101/61, pulse 95, height 5\' 3"  (1.6 m), weight 115 lb (52.2 kg), SpO2 97 %. Body mass index is 20.37 kg/m.     09/26/2022   11:15 AM 03/13/2022   10:45 PM 03/13/2022   10:30 PM  Vitals with BMI  Height 5\' 3"     Weight 115 lbs    BMI  20.38    Systolic 101 93 99  Diastolic 61 76 65  Pulse 95 69 69     Physical Exam Vitals reviewed.  HENT:     Head: Normocephalic and atraumatic.  Cardiovascular:     Rate and Rhythm: Normal rate and regular rhythm.     Pulses: Normal pulses.     Heart sounds: Normal heart sounds. No murmur heard. Pulmonary:     Effort: Pulmonary effort is normal.     Breath sounds: Normal breath sounds.  Abdominal:     General: Bowel sounds are normal.  Musculoskeletal:     Right lower leg: No edema.     Left lower leg: No edema.  Skin:    General: Skin is warm and dry.  Neurological:     Mental Status: She is alert.     Medications and allergies  No Known Allergies   Medication list after today's encounter   Current Outpatient Medications:    ALPRAZolam (XANAX) 1 MG tablet, Take 1 tablet (1 mg total) by mouth in the morning., Disp: 30 tablet, Rfl: 3   alprazolam (XANAX) 2 MG tablet, Take 1 tablet (2 mg total) by mouth nightly, Disp: 30 tablet, Rfl: 3   amitriptyline (  ELAVIL) 75 MG tablet, Take 1 tablet (75 mg total) by mouth at bedtime., Disp: 14 tablet, Rfl: 0   clindamycin (CLINDAGEL) 1 % gel, Apply 1 application topically 2 (two) times daily., Disp: 30 g, Rfl: 4   dexlansoprazole (DEXILANT) 60 MG capsule, Take 1 capsule (60 mg total) by mouth daily as directed, Disp: 90 capsule, Rfl: 5   hydrocortisone (ANUSOL-HC) 2.5 % rectal cream, Apply 1 application topically to the affected area(s) daily., Disp: 30 g, Rfl: 1   lidocaine (LIDODERM) 5 %, Place 1 patch onto the skin daily. Remove & Discard patch within 12 hours or as directed by MD, Disp: 30 patch, Rfl: 0   linaclotide (LINZESS) 72 MCG capsule, Take 1 capsule (72 mcg total) by mouth daily as directed., Disp: 90 capsule, Rfl: 2   metoprolol tartrate (LOPRESSOR) 50 MG tablet, Take 1 tablet (50 mg total) by mouth 2 (two) times daily as directed, Disp: 60 tablet, Rfl: 5   nitroGLYCERIN (NITROSTAT) 0.4 MG SL tablet, Place 1 tablet (0.4 mg  total) under the tongue daily as needed, Disp: 25 tablet, Rfl: 1   nystatin (MYCOSTATIN) 100000 UNIT/ML suspension, swish and spit 5 mLs (500,000 Units total) by mouth 3 (three) times daily., Disp: 210 mL, Rfl: 3   propranolol (INDERAL) 10 MG tablet, Take 1 tablet (10 mg total) by mouth 3 (three) times daily as needed., Disp: 90 tablet, Rfl: 3   sucralfate (CARAFATE) 1 g tablet, Take 1 tablet (1 g total) by mouth 3 (three) times daily before meals., Disp: 90 tablet, Rfl: 1   clindamycin (CLEOCIN T) 1 % external solution, APPLY ON THE SKIN TWICE A DAY, Disp: 180 mL, Rfl: 5   dexlansoprazole (DEXILANT) 60 MG capsule, Take 1 capsule (60 mg total) by mouth daily as directed, Disp: 90 capsule, Rfl: 5   fluconazole (DIFLUCAN) 150 MG tablet, TAKE 1 TABLET BY MOUTH ONCE DAILY AS DIRECTED., Disp: 2 tablet, Rfl: 0   hydrocortisone (PROCTOZONE-HC) 2.5 % rectal cream, Apply a thin layer to the affected areas twice a day to four times a day, Disp: 90 g, Rfl: 3   linaclotide (LINZESS) 145 MCG CAPS capsule, Take 1 capsule (145 mcg total) by mouth daily for constipation as directed, Disp: 90 capsule, Rfl: 1   metroNIDAZOLE (METROGEL) 0.75 % vaginal gel, Place 1 Applicatorful vaginally every week for 90 days, Disp: 350 g, Rfl: 3  Laboratory examination:   Lab Results  Component Value Date   NA 140 03/13/2022   K 3.4 (L) 03/13/2022   CO2 28 03/13/2022   GLUCOSE 98 03/13/2022   BUN 6 03/13/2022   CREATININE 0.85 03/13/2022   CALCIUM 9.0 03/13/2022   GFRNONAA >60 03/13/2022       Latest Ref Rng & Units 03/13/2022    7:59 PM 11/01/2018   12:23 PM 09/29/2015    9:47 PM  CMP  Glucose 70 - 99 mg/dL 98  92  734   BUN 6 - 20 mg/dL 6  13  7    Creatinine 0.44 - 1.00 mg/dL  1.93  7.90   Sodium 135 - 145 mmol/L 140  137  139   Potassium 3.5 - 5.1 mmol/L 3.4  3.6  2.9   Chloride 98 - 111 mmol/L 107  102  101   CO2 22 - 32 mmol/L 28  24  27    Calcium 8.9 - 10.3 mg/dL 9.0  9.0  9.3   Total Protein 6.5 - 8.1  g/dL  8.8  Total Bilirubin 0.3 - 1.2 mg/dL  1.0    Alkaline Phos 38 - 126 U/L  72    AST 15 - 41 U/L  20    ALT 0 - 44 U/L  17        Latest Ref Rng & Units 03/13/2022    7:59 PM 11/01/2018   12:23 PM 09/29/2015    9:47 PM  CBC  WBC 4.0 - 10.5 K/uL 6.2  10.2  8.2   Hemoglobin 12.0 - 15.0 g/dL 12.1  13.9  12.8   Hematocrit 36.0 - 46.0 % 36.6  43.1  38.3   Platelets 150 - 400 K/uL 215  227  256     Lipid Panel No results for input(s): "CHOL", "TRIG", "[place]", "VLDL", "HDL", "CHOLHDL", "LDLDIRECT" in the last 8760 hours.  HEMOGLOBIN A1C No results found for: "HGBA1C", "MPG" TSH No results for input(s): "TSH" in the last 8760 hours.  External labs:     Radiology:    Cardiac Studies:      EKG:   09/26/2022: normal sinus rhythm. Normal EKG. No evidence of ischemia  Assessment     ICD-10-CM   1. Essential hypertension  I10 PCV ECHOCARDIOGRAM COMPLETE    2. Palpitations  R00.2 PCV ECHOCARDIOGRAM COMPLETE    3. Chest pain of uncertain etiology  K44.0        Orders Placed This Encounter  Procedures   PCV ECHOCARDIOGRAM COMPLETE    Standing Status:   Future    Number of Occurrences:   1    Standing Expiration Date:   09/27/2023    Meds ordered this encounter  Medications   propranolol (INDERAL) 10 MG tablet    Sig: Take 1 tablet (10 mg total) by mouth 3 (three) times daily as needed.    Dispense:  90 tablet    Refill:  3    Medications Discontinued During This Encounter  Medication Reason   metoprolol tartrate (LOPRESSOR) 50 MG tablet Duplicate   metoprolol tartrate (LOPRESSOR) 50 MG tablet Duplicate   metoprolol tartrate (LOPRESSOR) 50 MG tablet Duplicate   linaclotide (LINZESS) 72 MCG capsule Duplicate   linaclotide (LINZESS) 72 MCG capsule Duplicate   linaclotide (LINZESS) 72 MCG capsule Duplicate   linaclotide (LINZESS) 72 MCG capsule Duplicate   linaclotide (LINZESS) 72 MCG capsule Duplicate   hydrocortisone (PROCTOZONE-HC) 2.5 % rectal cream  Duplicate   fluconazole (DIFLUCAN) 102 MG tablet Duplicate   fluconazole (DIFLUCAN) 725 MG tablet Duplicate   dexlansoprazole (DEXILANT) 60 MG capsule Duplicate   dexlansoprazole (DEXILANT) 60 MG capsule Duplicate   dexlansoprazole (DEXILANT) 60 MG capsule Duplicate   dexlansoprazole (DEXILANT) 60 MG capsule Duplicate   Dexlansoprazole (DEXILANT) 30 MG capsule Duplicate   clindamycin (CLEOCIN) 2 % vaginal cream Completed Course   amitriptyline (ELAVIL) 75 MG tablet Completed Course   amitriptyline (ELAVIL) 75 MG tablet Duplicate   alprazolam (XANAX) 2 MG tablet Duplicate   amitriptyline (ELAVIL) 75 MG tablet Duplicate   amitriptyline (ELAVIL) 75 MG tablet Duplicate   amitriptyline (ELAVIL) 75 MG tablet Duplicate   amitriptyline (ELAVIL) 75 MG tablet Duplicate   amitriptyline (ELAVIL) 75 MG tablet Duplicate   amitriptyline (ELAVIL) 75 MG tablet Duplicate   cephALEXin (KEFLEX) 500 MG capsule Completed Course   ALPRAZolam (XANAX) 1 MG tablet Duplicate   ALPRAZolam (XANAX) 1 MG tablet Duplicate   ALPRAZolam (XANAX) 0.5 MG tablet Duplicate   clindamycin (CLINDAGEL) 1 % gel Duplicate   fluconazole (DIFLUCAN) 366 MG tablet Duplicate   methylPREDNISolone (MEDROL) 4  MG TBPK tablet Completed Course   metoprolol tartrate (LOPRESSOR) 50 MG tablet Duplicate   metoprolol tartrate (LOPRESSOR) 50 MG tablet Duplicate   metoprolol tartrate (LOPRESSOR) 50 MG tablet Duplicate   metoprolol tartrate (LOPRESSOR) 50 MG tablet Duplicate   nitroGLYCERIN (NITROSTAT) 0.4 MG SL tablet Duplicate   nitroGLYCERIN (NITROSTAT) 0.4 MG SL tablet Duplicate   nystatin (MYCOSTATIN) 100000 UNIT/ML suspension Duplicate   ranitidine (ZANTAC) 150 MG tablet Duplicate   nystatin (MYCOSTATIN) 100000 UNIT/ML suspension Duplicate   methylPREDNISolone (MEDROL) 4 MG TBPK tablet Completed Course   fluconazole (DIFLUCAN) 150 MG tablet Completed Course   fluconazole (DIFLUCAN) 150 MG tablet Completed Course   dexlansoprazole  (DEXILANT) 60 MG capsule Duplicate   dexlansoprazole (DEXILANT) 60 MG capsule Duplicate   clindamycin (CLINDAGEL) 1 % gel Duplicate   ciprofloxacin-dexamethasone (CIPRODEX) OTIC suspension Duplicate   clindamycin (CLEOCIN T) 1 % external solution Duplicate   ALPRAZolam (XANAX) 0.5 MG tablet Duplicate   clindamycin (CLINDAGEL) 1 % gel Duplicate   ALPRAZolam (XANAX) 0.5 MG tablet Duplicate   cyclobenzaprine (FLEXERIL) 10 MG tablet Completed Course   cyclobenzaprine (FLEXERIL) 10 MG tablet Completed Course   dicyclomine (BENTYL) 20 MG tablet Discontinued by provider   metoprolol tartrate (LOPRESSOR) 25 MG tablet Dose change   nirmatrelvir & ritonavir (PAXLOVID, 300/100,) 20 x 150 MG & 10 x 100MG  TBPK Completed Course   nystatin (MYCOSTATIN) 100000 UNIT/ML suspension Duplicate   ondansetron (ZOFRAN) 4 MG tablet Completed Course   valACYclovir (VALTREX) 1000 MG tablet Duplicate   valACYclovir (VALTREX) 1000 MG tablet Completed Course     Recommendations:   HAROLDINE REDLER is a 42 y.o.  female with chest pain  Essential hypertension Patient admits her BP is high when she is nervous and anxious. Propranolol should help with anxiety and BP. Encourage low-sodium diet, less than 2000 mg daily.   Palpitations Will try propranolol 10 mg TID PRN   Chest pain of uncertain etiology Suspect this is anxiety related Patient to follow-up PRN Echocardiogram ordered, patient would prefer to be called about her results and she will follow-up if she has any further symptoms or questions     42, DO, [facility]  09/30/2022, 2:20 PM Office: [phone] Pager: [phone]

## 2022-09-27 ENCOUNTER — Other Ambulatory Visit (HOSPITAL_COMMUNITY): Payer: Self-pay

## 2022-09-27 MED ORDER — CLINDAMYCIN PHOSPHATE 1 % EX SOLN
Freq: Two times a day (BID) | CUTANEOUS | 5 refills | Status: DC
  Filled 2022-09-27: qty 180, 30d supply, fill #0
  Filled 2022-10-05: qty 180, 90d supply, fill #0
  Filled 2023-07-12: qty 60, 30d supply, fill #1
  Filled 2023-07-28 – 2023-08-21 (×3): qty 60, 30d supply, fill #2

## 2022-09-27 MED ORDER — FLUCONAZOLE 150 MG PO TABS
150.0000 mg | ORAL_TABLET | Freq: Every day | ORAL | 0 refills | Status: DC
  Filled 2022-09-27: qty 2, 2d supply, fill #0

## 2022-09-27 MED ORDER — LINZESS 145 MCG PO CAPS
145.0000 ug | ORAL_CAPSULE | Freq: Every day | ORAL | 1 refills | Status: DC
  Filled 2022-09-27: qty 90, 90d supply, fill #0

## 2022-09-27 MED ORDER — METRONIDAZOLE 0.75 % VA GEL
1.0000 | VAGINAL | 3 refills | Status: DC
  Filled 2022-09-27: qty 210, 90d supply, fill #0
  Filled 2023-07-12: qty 70, 30d supply, fill #1
  Filled 2023-07-28 – 2023-08-21 (×5): qty 70, 30d supply, fill #2

## 2022-09-27 MED ORDER — HYDROCORTISONE (PERIANAL) 2.5 % EX CREA
TOPICAL_CREAM | CUTANEOUS | 3 refills | Status: DC
  Filled 2022-09-27: qty 90, 30d supply, fill #0
  Filled 2022-09-27: qty 90, 90d supply, fill #0
  Filled 2022-09-28: qty 90, 30d supply, fill #0
  Filled 2022-10-07: qty 60, 20d supply, fill #0
  Filled 2022-10-20 – 2022-10-27 (×2): qty 60, 20d supply, fill #1
  Filled 2022-11-22 – 2022-12-05 (×2): qty 60, 20d supply, fill #2

## 2022-09-27 MED ORDER — DEXLANSOPRAZOLE 60 MG PO CPDR
60.0000 mg | DELAYED_RELEASE_CAPSULE | Freq: Every day | ORAL | 5 refills | Status: DC
  Filled 2022-09-27: qty 90, 90d supply, fill #0
  Filled 2023-07-12: qty 30, 30d supply, fill #0
  Filled 2023-07-12: qty 90, 90d supply, fill #0

## 2022-09-28 ENCOUNTER — Other Ambulatory Visit (HOSPITAL_COMMUNITY): Payer: Self-pay

## 2022-09-28 ENCOUNTER — Ambulatory Visit: Payer: Commercial Managed Care - PPO

## 2022-09-28 ENCOUNTER — Other Ambulatory Visit: Payer: Self-pay

## 2022-09-28 DIAGNOSIS — R002 Palpitations: Secondary | ICD-10-CM | POA: Diagnosis not present

## 2022-09-28 DIAGNOSIS — I10 Essential (primary) hypertension: Secondary | ICD-10-CM

## 2022-09-29 NOTE — Progress Notes (Signed)
Left voicemail for patient to call back for results.

## 2022-09-29 NOTE — Progress Notes (Signed)
normal

## 2022-09-29 NOTE — Progress Notes (Signed)
Patient aware

## 2022-10-05 ENCOUNTER — Other Ambulatory Visit: Payer: Self-pay

## 2022-10-05 ENCOUNTER — Ambulatory Visit: Payer: 59 | Admitting: Cardiology

## 2022-10-05 ENCOUNTER — Ambulatory Visit: Payer: Commercial Managed Care - PPO | Admitting: Podiatry

## 2022-10-05 ENCOUNTER — Other Ambulatory Visit (HOSPITAL_COMMUNITY): Payer: Self-pay

## 2022-10-06 ENCOUNTER — Other Ambulatory Visit: Payer: Self-pay

## 2022-10-07 ENCOUNTER — Other Ambulatory Visit (HOSPITAL_COMMUNITY): Payer: Self-pay

## 2022-10-10 ENCOUNTER — Other Ambulatory Visit (HOSPITAL_COMMUNITY): Payer: Self-pay

## 2022-10-10 ENCOUNTER — Encounter: Payer: Self-pay | Admitting: Physician Assistant

## 2022-10-11 ENCOUNTER — Other Ambulatory Visit: Payer: Self-pay

## 2022-10-17 ENCOUNTER — Ambulatory Visit: Payer: Commercial Managed Care - PPO | Admitting: Physician Assistant

## 2022-10-17 ENCOUNTER — Other Ambulatory Visit (HOSPITAL_COMMUNITY): Payer: Self-pay

## 2022-10-17 ENCOUNTER — Encounter: Payer: Self-pay | Admitting: Physician Assistant

## 2022-10-17 VITALS — BP 100/68 | HR 86 | Ht 62.0 in | Wt 117.0 lb

## 2022-10-17 DIAGNOSIS — K219 Gastro-esophageal reflux disease without esophagitis: Secondary | ICD-10-CM | POA: Diagnosis not present

## 2022-10-17 DIAGNOSIS — R1013 Epigastric pain: Secondary | ICD-10-CM

## 2022-10-17 DIAGNOSIS — K5909 Other constipation: Secondary | ICD-10-CM

## 2022-10-17 MED ORDER — FAMOTIDINE 40 MG PO TABS
40.0000 mg | ORAL_TABLET | Freq: Two times a day (BID) | ORAL | 3 refills | Status: DC
  Filled 2022-10-17: qty 60, 30d supply, fill #0
  Filled 2022-11-22 – 2022-12-05 (×2): qty 60, 30d supply, fill #1
  Filled 2022-12-18 – 2023-01-03 (×3): qty 60, 30d supply, fill #2
  Filled 2023-03-16: qty 60, 30d supply, fill #3

## 2022-10-17 NOTE — Progress Notes (Signed)
Chief Complaint: Epigastric Abdominal pain, GERD and chronic constipation  HPI:    Christina [name] is a 42 year old female with a past medical history as listed below including GERD, who was referred to me by Vonna Drafts, FNP for a complaint of epigastric abdominal pain, GERD and chronic constipation.      09/26/2022 patient seen with cardiology.  At that time discussed palpitations and chest pain and anxiety.  An echo was ordered.  She was started on Propranolol 10 mg 3 times daily as needed.    09/28/2022 echo was normal.    Today, the patient tells me that for the past 10 years she has had gastric issues.  She was tried on various antireflux medications and eventually found Dexilant 60 mg which was working well for her but then it became too expensive.  Most recently over the past month she was restarted on Dexilant.  Describes that she was having a large increase in reflux symptoms including projectile vomiting which would wake up from her sleep and cause aspiration and she would start coughing and it would take a long time to get it calmed down.  As well as occasional epigastric pain.  Describes that she was not taking her Dexilant daily but over the past 2 weeks has really been pretty religious with this.  It is helping a little bit.    Also with chronic constipation on Linzess 145 mcg daily.  As long as she remembers to take this daily she has no problems.    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    Denies fever, chills, weight loss, nausea or change in bowel habits.  Past Medical History:  Diagnosis Date   Abnormal Pap smear    BV (bacterial vaginosis)    GERD (gastroesophageal reflux disease)    Migraine     Past Surgical History:  Procedure Laterality Date   CESAREAN SECTION     DENTAL SURGERY      Current Outpatient Medications  Medication Sig Dispense Refill   ALPRAZolam (XANAX) 1 MG tablet Take 1 tablet (1 mg total) by mouth in the morning. 30 tablet 3   alprazolam (XANAX) 2  MG tablet Take 1 tablet (2 mg total) by mouth nightly 30 tablet 3   amitriptyline (ELAVIL) 75 MG tablet Take 1 tablet (75 mg total) by mouth at bedtime. 14 tablet 0   clindamycin (CLEOCIN T) 1 % external solution Apply topically 2 (two) times daily. 180 mL 5   clindamycin (CLINDAGEL) 1 % gel Apply 1 application topically 2 (two) times daily. 30 g 4   dexlansoprazole (DEXILANT) 60 MG capsule Take 1 capsule (60 mg total) by mouth daily as directed 90 capsule 5   dexlansoprazole (DEXILANT) 60 MG capsule Take 1 capsule (60 mg total) by mouth daily as directed 90 capsule 5   fluconazole (DIFLUCAN) 150 MG tablet TAKE 1 TABLET BY MOUTH ONCE DAILY AS DIRECTED. 2 tablet 0   hydrocortisone (ANUSOL-HC) 2.5 % rectal cream Apply 1 application topically to the affected area(s) daily. 30 g 1   hydrocortisone (PROCTOZONE-HC) 2.5 % rectal cream Apply a thin layer to the affected areas twice a day to four times a day 90 g 3   lidocaine (LIDODERM) 5 % Place 1 patch onto the skin daily. Remove & Discard patch within 12 hours or as directed by MD 30 patch 0   linaclotide (LINZESS) 145 MCG CAPS capsule Take 1 capsule (145 mcg total) by mouth daily for constipation as  directed 90 capsule 1   linaclotide (LINZESS) 72 MCG capsule Take 1 capsule (72 mcg total) by mouth daily as directed. 90 capsule 2   metoprolol tartrate (LOPRESSOR) 50 MG tablet Take 1 tablet (50 mg total) by mouth 2 (two) times daily as directed 60 tablet 5   metroNIDAZOLE (METROGEL) 0.75 % vaginal gel Place 1 Applicatorful vaginally every week for 90 days 350 g 3   nitroGLYCERIN (NITROSTAT) 0.4 MG SL tablet Place 1 tablet (0.4 mg total) under the tongue daily as needed 25 tablet 1   nystatin (MYCOSTATIN) 100000 UNIT/ML suspension swish and spit 5 mLs (500,000 Units total) by mouth 3 (three) times daily. 210 mL 3   propranolol (INDERAL) 10 MG tablet Take 1 tablet (10 mg total) by mouth 3 (three) times daily as needed. 90 tablet 3   sucralfate (CARAFATE) 1  g tablet Take 1 tablet (1 g total) by mouth 3 (three) times daily before meals. 90 tablet 1   No current facility-administered medications for this visit.    Allergies as of 10/17/2022   (No Known Allergies)    Family History  Problem Relation Age of Onset   Arrhythmia Mother    Hypertension Mother    Stroke Mother    Heart attack Mother    Cancer Father        brain and lung   Hypertension Father    Migraines Father    Colon cancer Neg Hx    Stomach cancer Neg Hx     Social History   Socioeconomic History   Marital status: Single    Spouse name: Not on file   Number of children: 2   Years of education: Not on file   Highest education level: Not on file  Occupational History   Not on file  Tobacco Use   Smoking status: Never   Smokeless tobacco: Never  Vaping Use   Vaping Use: Never used  Substance and Sexual Activity   Alcohol use: No   Drug use: No   Sexual activity: Not on file  Other Topics Concern   Not on file  Social History Narrative   Pt lives at home with her two children. She is single, has some college level education, and will be starting a new job on 12/05/12. She does drink caffeine.    Social Determinants of Health   Financial Resource Strain: Not on file  Food Insecurity: Not on file  Transportation Needs: Not on file  Physical Activity: Not on file  Stress: Not on file  Social Connections: Not on file  Intimate Partner Violence: Not on file    Review of Systems:    Constitutional: No weight loss, fever or chills Skin: No rash  Cardiovascular: No chest pain  Respiratory: No SOB  Gastrointestinal: See HPI and otherwise negative Genitourinary: No dysuria  Neurological: No headache, dizziness or syncope Musculoskeletal: No new muscle or joint pain Hematologic: No bleeding Psychiatric: No history of depression or anxiety   Physical Exam:  Vital signs: BP 100/68   Pulse 86   Ht 5\' 2"  (1.575 m)   Wt 117 lb (53.1 kg)   SpO2 99%    BMI 21.40 kg/m    Constitutional:   Pleasant Caucasian female appears to be in NAD, Well developed, Well nourished, alert and cooperative Head:  Normocephalic and atraumatic. Eyes:   PEERL, EOMI. No icterus. Conjunctiva pink. Ears:  Normal auditory acuity. Neck:  Supple Throat: Oral cavity and pharynx without inflammation, swelling or lesion.  Respiratory: Respirations even and unlabored. Lungs clear to auscultation bilaterally.   No wheezes, crackles, or rhonchi.  Cardiovascular: Normal S1, S2. No MRG. Regular rate and rhythm. No peripheral edema, cyanosis or pallor.  Gastrointestinal:  Soft, nondistended, moderate epigastric TTP. No rebound or guarding. Normal bowel sounds. No appreciable masses or hepatomegaly. Rectal:  Not performed.  Msk:  Symmetrical without gross deformities. Without edema, no deformity or joint abnormality.  Neurologic:  Alert and  oriented x4;  grossly normal neurologically.  Skin:   Dry and intact without significant lesions or rashes. Psychiatric: Demonstrates good judgement and reason without abnormal affect or behaviors.  RELEVANT LABS AND IMAGING: CBC    Component Value Date/Time   WBC 6.2 03/13/2022 1959   RBC 3.84 (L) 03/13/2022 1959   HGB 12.1 03/13/2022 1959   HCT 36.6 03/13/2022 1959   PLT 215 03/13/2022 1959   MCV 95.3 03/13/2022 1959   MCH 31.5 03/13/2022 1959   MCHC 33.1 03/13/2022 1959   RDW 12.4 03/13/2022 1959   LYMPHSABS 2.1 01/02/2014 1735   MONOABS 0.7 01/02/2014 1735   EOSABS 0.0 01/02/2014 1735   BASOSABS 0.0 01/02/2014 1735    CMP     Component Value Date/Time   NA 140 03/13/2022 1959   K 3.4 (L) 03/13/2022 1959   CL 107 03/13/2022 1959   CO2 28 03/13/2022 1959   GLUCOSE 98 03/13/2022 1959   BUN 6 03/13/2022 1959   CREATININE 0.85 03/13/2022 1959   CALCIUM 9.0 03/13/2022 1959   PROT 8.8 (H) 11/01/2018 1223   ALBUMIN 4.4 11/01/2018 1223   AST 20 11/01/2018 1223   ALT 17 11/01/2018 1223   ALKPHOS 72 11/01/2018 1223    BILITOT 1.0 11/01/2018 1223   GFRNONAA >60 03/13/2022 1959   GFRAA >60 11/01/2018 1223    Assessment: 1.  GERD: Chronic for the past 10 years, previously Dexilant 60 mg helped, she has just recently restarted this consistently; likely gastritis +/- PUD 2.  Epigastric pain 3.  Chronic constipation: Better with Linzess 145 mcg daily over the past few years  Plan: 1.  Scheduled patient for diagnostic EGD in the [place] given longstanding GERD with ongoing symptoms regardless of Dexilant 60 mg daily. This was scheduled with Dr. [name].  Did provide the patient a detailed list of risks for the procedure and she agrees to proceed. Patient is appropriate for endoscopic procedure(s) in the ambulatory ([facility]) setting.  2.  Recommend patient continue her Dexilant 60 mg every morning.  She has this medication at home. 3.  Also started Pepcid 40 mg twice daily, every morning and nightly #60 with 5 refills. 4.  Currently patient is trying to take Carafate, discussed timing of this, if she wants to continue to try it she needs to take it an hour before 2 hours after other medications. 5.  Continue Linzess 145 mcg daily 6.  Reviewed antireflux diet and lifestyle modifications. 7.  Patient to follow in clinic per recommendations from Dr. [name] after time of procedure.  Ellouise Newer, PA-C [facility] 10/17/2022, 11:01 AM  Cc: Vonna Drafts, FNP

## 2022-10-17 NOTE — Progress Notes (Signed)
I agree with the assessment and plan as outlined by Ms. Lemmon. 

## 2022-10-17 NOTE — Patient Instructions (Addendum)
You have been scheduled for an endoscopy. Please follow written instructions given to you at your visit today. If you use inhalers (even only as needed), please bring them with you on the day of your procedure.   We have sent the following medications to your pharmacy for you to pick up at your convenience:   Start Pepcid 40 mg 2 times daily  at bedtime  Continue Linzess145 mcg, Dexilant 60 mg as directed  If your blood pressure at your visit was 140/90 or greater, please contact your primary care physician to follow up on this.  _______________________________________________________  If you are age 63 or older, your body mass index should be between 23-30. Your Body mass index is 21.4 kg/m. If this is out of the aforementioned range listed, please consider follow up with your Primary Care Provider.  If you are age 38 or younger, your body mass index should be between 19-25. Your Body mass index is 21.4 kg/m. If this is out of the aformentioned range listed, please consider follow up with your Primary Care Provider.   ________________________________________________________  The  GI providers would like to encourage you to use [facility] to communicate with providers for non-urgent requests or questions.  Due to long hold times on the telephone, sending your provider a message by [facility] may be a faster and more efficient way to get a response.  Please allow 48 business hours for a response.  Please remember that this is for non-urgent requests.   Due to recent changes in healthcare laws, you may see the results of your imaging and laboratory studies on MyChart before your provider has had a chance to review them.  We understand that in some cases there may be results that are confusing or concerning to you. Not all laboratory results come back in the same time frame and the provider may be waiting for multiple results in order to interpret others.  Please give [place] 48 hours in order for  your provider to thoroughly review all the results before contacting the office for clarification of your results.    Thank you for entrusting me with your care and choosing [facility].  Ellouise Newer PA-C

## 2022-10-18 ENCOUNTER — Ambulatory Visit: Payer: Commercial Managed Care - PPO | Admitting: Podiatry

## 2022-10-19 ENCOUNTER — Other Ambulatory Visit (HOSPITAL_COMMUNITY): Payer: Self-pay

## 2022-10-19 DIAGNOSIS — Z1151 Encounter for screening for human papillomavirus (HPV): Secondary | ICD-10-CM | POA: Diagnosis not present

## 2022-10-19 DIAGNOSIS — R87610 Atypical squamous cells of undetermined significance on cytologic smear of cervix (ASC-US): Secondary | ICD-10-CM | POA: Diagnosis not present

## 2022-10-19 DIAGNOSIS — Z13220 Encounter for screening for lipoid disorders: Secondary | ICD-10-CM | POA: Diagnosis not present

## 2022-10-19 DIAGNOSIS — Z1329 Encounter for screening for other suspected endocrine disorder: Secondary | ICD-10-CM | POA: Diagnosis not present

## 2022-10-19 DIAGNOSIS — N898 Other specified noninflammatory disorders of vagina: Secondary | ICD-10-CM | POA: Diagnosis not present

## 2022-10-19 DIAGNOSIS — Z01419 Encounter for gynecological examination (general) (routine) without abnormal findings: Secondary | ICD-10-CM | POA: Diagnosis not present

## 2022-10-19 DIAGNOSIS — Z124 Encounter for screening for malignant neoplasm of cervix: Secondary | ICD-10-CM | POA: Diagnosis not present

## 2022-10-19 DIAGNOSIS — Z131 Encounter for screening for diabetes mellitus: Secondary | ICD-10-CM | POA: Diagnosis not present

## 2022-10-19 DIAGNOSIS — Z113 Encounter for screening for infections with a predominantly sexual mode of transmission: Secondary | ICD-10-CM | POA: Diagnosis not present

## 2022-10-19 DIAGNOSIS — N760 Acute vaginitis: Secondary | ICD-10-CM | POA: Diagnosis not present

## 2022-10-19 MED ORDER — CLINDAMYCIN PHOSPHATE 2 % VA GEL
VAGINAL | 3 refills | Status: DC
  Filled 2022-10-19: qty 96, 84d supply, fill #0
  Filled 2022-10-20: qty 8, 7d supply, fill #0
  Filled 2022-10-20: qty 88, 77d supply, fill #0

## 2022-10-20 ENCOUNTER — Other Ambulatory Visit (HOSPITAL_COMMUNITY): Payer: Self-pay

## 2022-10-20 ENCOUNTER — Other Ambulatory Visit: Payer: Self-pay

## 2022-10-21 ENCOUNTER — Other Ambulatory Visit (HOSPITAL_COMMUNITY): Payer: Self-pay

## 2022-10-23 ENCOUNTER — Other Ambulatory Visit (HOSPITAL_COMMUNITY): Payer: Self-pay

## 2022-10-23 MED ORDER — XACIATO 2 % VA GEL
VAGINAL | 3 refills | Status: DC
  Filled 2022-10-23: qty 55, 77d supply, fill #0
  Filled 2022-10-23: qty 5, 7d supply, fill #0
  Filled 2023-07-12: qty 24, 30d supply, fill #1
  Filled 2023-07-28 – 2023-08-21 (×5): qty 24, 30d supply, fill #2
  Filled 2023-09-17: qty 24, 30d supply, fill #3

## 2022-10-25 ENCOUNTER — Ambulatory Visit (AMBULATORY_SURGERY_CENTER): Payer: Self-pay | Admitting: Internal Medicine

## 2022-10-25 ENCOUNTER — Encounter: Payer: Self-pay | Admitting: Internal Medicine

## 2022-10-25 ENCOUNTER — Other Ambulatory Visit (HOSPITAL_COMMUNITY): Payer: Self-pay

## 2022-10-25 VITALS — BP 102/67 | HR 72 | Temp 97.3°F | Resp 27 | Ht 62.0 in | Wt 117.0 lb

## 2022-10-25 DIAGNOSIS — K219 Gastro-esophageal reflux disease without esophagitis: Secondary | ICD-10-CM

## 2022-10-25 DIAGNOSIS — B3781 Candidal esophagitis: Secondary | ICD-10-CM

## 2022-10-25 DIAGNOSIS — K2951 Unspecified chronic gastritis with bleeding: Secondary | ICD-10-CM

## 2022-10-25 DIAGNOSIS — K2970 Gastritis, unspecified, without bleeding: Secondary | ICD-10-CM

## 2022-10-25 MED ORDER — SODIUM CHLORIDE 0.9 % IV SOLN: 500.0000 mL | Freq: Once | INTRAVENOUS | Status: DC

## 2022-10-25 MED ORDER — FLUCONAZOLE 100 MG PO TABS
100.0000 mg | ORAL_TABLET | Freq: Every day | ORAL | 0 refills | Status: DC
  Filled 2022-10-25: qty 30, 30d supply, fill #0

## 2022-10-25 NOTE — Progress Notes (Signed)
Report given to PACU, vss 

## 2022-10-25 NOTE — Progress Notes (Signed)
GASTROENTEROLOGY PROCEDURE H&P NOTE   Primary Care Physician: Vonna Drafts, FNP    Reason for Procedure:   GERD, epigastric ab pain  Plan:    EGD  Patient is appropriate for endoscopic procedure(s) in the ambulatory ([facility]) setting.  The nature of the procedure, as well as the risks, benefits, and alternatives were carefully and thoroughly reviewed with the patient. Ample time for discussion and questions allowed. The patient understood, was satisfied, and agreed to proceed.     HPI: Christina [name] is a 42 y.o. female who presents for EGD for evaluation of GERD and epigastric ab pain .  Patient was most recently seen in the Gastroenterology Clinic on 10/17/22.  No interval change in medical history since that appointment. Please refer to that note for full details regarding GI history and clinical presentation.   Past Medical History:  Diagnosis Date   Abnormal Pap smear    BV (bacterial vaginosis)    GERD (gastroesophageal reflux disease)    Migraine     Past Surgical History:  Procedure Laterality Date   CESAREAN SECTION     DENTAL SURGERY      Prior to Admission medications   Medication Sig Start Date End Date Taking? Authorizing Provider  ALPRAZolam Duanne Moron) 1 MG tablet Take 1 tablet (1 mg total) by mouth in the morning. 07/11/22  Yes   alprazolam (XANAX) 2 MG tablet Take 1 tablet (2 mg total) by mouth nightly 07/11/22  Yes   amitriptyline (ELAVIL) 75 MG tablet Take 1 tablet (75 mg total) by mouth at bedtime. 07/09/17  Yes Hawks, Christy A, FNP  dexlansoprazole (DEXILANT) 60 MG capsule Take 1 capsule (60 mg total) by mouth daily as directed 09/08/22  Yes   dexlansoprazole (DEXILANT) 60 MG capsule Take 1 capsule (60 mg total) by mouth daily as directed 09/27/22  Yes   famotidine (PEPCID) 40 MG tablet Take 1 tablet (40 mg total) by mouth 2 (two) times daily. 10/17/22  Yes Levin Erp, PA  fluconazole (DIFLUCAN) 150 MG tablet TAKE 1 TABLET BY MOUTH ONCE  DAILY AS DIRECTED. 09/27/22  Yes   linaclotide (LINZESS) 145 MCG CAPS capsule Take 1 capsule (145 mcg total) by mouth daily for constipation as directed 09/27/22  Yes   linaclotide (LINZESS) 72 MCG capsule Take 1 capsule (72 mcg total) by mouth daily as directed. 07/11/22  Yes   metoprolol tartrate (LOPRESSOR) 50 MG tablet Take 1 tablet (50 mg total) by mouth 2 (two) times daily as directed 01/16/22  Yes   sucralfate (CARAFATE) 1 g tablet Take 1 tablet (1 g total) by mouth 3 (three) times daily before meals. 07/11/22  Yes   clindamycin (CLEOCIN T) 1 % external solution Apply topically 2 (two) times daily. 09/27/22     clindamycin (CLINDAGEL) 1 % gel Apply 1 application topically 2 (two) times daily. 01/20/22     Clindamycin Phosphate (XACIATO) 2 % GEL Insert 1 applicatorful vaginally once a week for 12 weeks 10/19/22     hydrocortisone (ANUSOL-HC) 2.5 % rectal cream Apply 1 application topically to the affected area(s) daily. 02/15/21     hydrocortisone (PROCTOZONE-HC) 2.5 % rectal cream Apply a thin layer to the affected areas twice a day to four times a day 09/27/22     lidocaine (LIDODERM) 5 % Place 1 patch onto the skin daily. Remove & Discard patch within 12 hours or as directed by MD 03/13/22   Kommor, Debe Coder, MD  metroNIDAZOLE (METROGEL) 0.75 % vaginal gel  Place 1 Applicatorful vaginally every week for 90 days 09/27/22     nitroGLYCERIN (NITROSTAT) 0.4 MG SL tablet Place 1 tablet (0.4 mg total) under the tongue as needed. 09/12/22     nystatin (MYCOSTATIN) 100000 UNIT/ML suspension swish and spit 5 mLs (500,000 Units total) by mouth 3 (three) times daily. 09/08/22     propranolol (INDERAL) 10 MG tablet Take 1 tablet (10 mg total) by mouth 3 (three) times daily as needed. 09/26/22   Custovic, Collene Mares, DO    Current Outpatient Medications  Medication Sig Dispense Refill   ALPRAZolam (XANAX) 1 MG tablet Take 1 tablet (1 mg total) by mouth in the morning. 30 tablet 3   alprazolam (XANAX) 2 MG tablet Take 1  tablet (2 mg total) by mouth nightly 30 tablet 3   amitriptyline (ELAVIL) 75 MG tablet Take 1 tablet (75 mg total) by mouth at bedtime. 14 tablet 0   dexlansoprazole (DEXILANT) 60 MG capsule Take 1 capsule (60 mg total) by mouth daily as directed 90 capsule 5   dexlansoprazole (DEXILANT) 60 MG capsule Take 1 capsule (60 mg total) by mouth daily as directed 90 capsule 5   famotidine (PEPCID) 40 MG tablet Take 1 tablet (40 mg total) by mouth 2 (two) times daily. 60 tablet 3   fluconazole (DIFLUCAN) 150 MG tablet TAKE 1 TABLET BY MOUTH ONCE DAILY AS DIRECTED. 2 tablet 0   linaclotide (LINZESS) 145 MCG CAPS capsule Take 1 capsule (145 mcg total) by mouth daily for constipation as directed 90 capsule 1   linaclotide (LINZESS) 72 MCG capsule Take 1 capsule (72 mcg total) by mouth daily as directed. 90 capsule 2   metoprolol tartrate (LOPRESSOR) 50 MG tablet Take 1 tablet (50 mg total) by mouth 2 (two) times daily as directed 60 tablet 5   sucralfate (CARAFATE) 1 g tablet Take 1 tablet (1 g total) by mouth 3 (three) times daily before meals. 90 tablet 1   clindamycin (CLEOCIN T) 1 % external solution Apply topically 2 (two) times daily. 180 mL 5   clindamycin (CLINDAGEL) 1 % gel Apply 1 application topically 2 (two) times daily. 30 g 4   Clindamycin Phosphate (XACIATO) 2 % GEL Insert 1 applicatorful vaginally once a week for 12 weeks 60 g 3   hydrocortisone (ANUSOL-HC) 2.5 % rectal cream Apply 1 application topically to the affected area(s) daily. 30 g 1   hydrocortisone (PROCTOZONE-HC) 2.5 % rectal cream Apply a thin layer to the affected areas twice a day to four times a day 90 g 3   lidocaine (LIDODERM) 5 % Place 1 patch onto the skin daily. Remove & Discard patch within 12 hours or as directed by MD 30 patch 0   metroNIDAZOLE (METROGEL) 0.75 % vaginal gel Place 1 Applicatorful vaginally every week for 90 days 350 g 3   nitroGLYCERIN (NITROSTAT) 0.4 MG SL tablet Place 1 tablet (0.4 mg total) under the  tongue as needed. 25 tablet 1   nystatin (MYCOSTATIN) 100000 UNIT/ML suspension swish and spit 5 mLs (500,000 Units total) by mouth 3 (three) times daily. 210 mL 3   propranolol (INDERAL) 10 MG tablet Take 1 tablet (10 mg total) by mouth 3 (three) times daily as needed. 90 tablet 3   Current Facility-Administered Medications  Medication Dose Route Frequency Provider Last Rate Last Admin   0.9 %  sodium chloride infusion  500 mL Intravenous Once Sharyn Creamer, MD        Allergies as of 10/25/2022   (  No Known Allergies)    Family History  Problem Relation Age of Onset   Arrhythmia Mother    Hypertension Mother    Stroke Mother    Heart attack Mother    Cancer Father        brain and lung   Hypertension Father    Migraines Father    Colon cancer Neg Hx    Stomach cancer Neg Hx     Social History   Socioeconomic History   Marital status: Single    Spouse name: Not on file   Number of children: 2   Years of education: Not on file   Highest education level: Not on file  Occupational History   Not on file  Tobacco Use   Smoking status: Never   Smokeless tobacco: Never  Vaping Use   Vaping Use: Never used  Substance and Sexual Activity   Alcohol use: No   Drug use: No   Sexual activity: Not on file  Other Topics Concern   Not on file  Social History Narrative   Pt lives at home with her two children. She is single, has some college level education, and will be starting a new job on 12/05/12. She does drink caffeine.    Social Determinants of Health   Financial Resource Strain: Not on file  Food Insecurity: Not on file  Transportation Needs: Not on file  Physical Activity: Not on file  Stress: Not on file  Social Connections: Not on file  Intimate Partner Violence: Not on file    Physical Exam: Vital signs in last 24 hours: BP 112/62   Pulse 88   Temp (!) 97.3 F (36.3 C)   Ht 5\' 2"  (1.575 m)   Wt 117 lb (53.1 kg)   SpO2 100%   BMI 21.40 kg/m  GEN:  NAD EYE: Sclerae anicteric ENT: MMM CV: Non-tachycardic Pulm: No increased WOB GI: Soft NEURO:  Alert & Oriented   Christia Reading, MD [facility]   10/25/2022 9:31 AM

## 2022-10-25 NOTE — Op Note (Signed)
[place] Patient Name: Christina [name] Procedure Date: 10/25/2022 9:35 AM MRN: [id number] Endoscopist: Adline Mango [place] , , [id number] Age: 42 Referring MD:  Date of Birth: 21-Apr-1981 Gender: Female Account #: [id number] Procedure:                Upper GI endoscopy Indications:              Epigastric abdominal pain, Heartburn, Nausea with                            vomiting Medicines:                Monitored Anesthesia Care Procedure:                Pre-Anesthesia Assessment:                           - Prior to the procedure, a History and Physical                            was performed, and patient medications and                            allergies were reviewed. The patient's tolerance of                            previous anesthesia was also reviewed. The risks                            and benefits of the procedure and the sedation                            options and risks were discussed with the patient.                            All questions were answered, and informed consent                            was obtained. Prior Anticoagulants: The patient has                            taken no anticoagulant or antiplatelet agents. ASA                            Grade Assessment: II - A patient with mild systemic                            disease. After reviewing the risks and benefits,                            the patient was deemed in satisfactory condition to                            undergo the procedure.  After obtaining informed consent, the endoscope was                            passed under direct vision. Throughout the                            procedure, the patient's blood pressure, pulse, and                            oxygen saturations were monitored continuously. The                            GIF [id number] #[id number] was introduced through the                            mouth, and advanced to the second part of  duodenum.                            The upper GI endoscopy was accomplished without                            difficulty. The patient tolerated the procedure                            well. Scope In: Scope Out: Findings:                 Localized, white plaques were found in the lower                            third of the esophagus. Biopsies were taken with a                            cold forceps for histology.                           Localized inflammation characterized by congestion                            (edema) and erythema was found in the gastric                            antrum. Biopsies were taken with a cold forceps for                            histology.                           The examined duodenum was normal. Complications:            No immediate complications. Estimated Blood Loss:     Estimated blood loss was minimal. Impression:               - Esophageal plaques were found, suspicious for  candidiasis. Biopsied.                           - Gastritis. Biopsied.                           - Normal examined duodenum. Recommendation:           - Discharge patient to home (with escort).                           - PO fluconazole 400 mg QD for one day, followed by                            200 mg QD for 13 days for treatment of esophageal                            candidiasis.                           - Await pathology results.                           - Return to GI clinic in 4 weeks.                           - The findings and recommendations were discussed                            with the patient. Dr Georgian Co "Christina [name]" [name],  10/25/2022 9:54:13 AM

## 2022-10-25 NOTE — Patient Instructions (Signed)
Take your new medication as ordered.  Read the directions. They are specific.  Resume all of your previous medications.  You have a repeat appointment with the doctor in April.  See your discharge sheet for details  YOU HAD AN ENDOSCOPIC PROCEDURE TODAY AT [facility]:   Refer to the procedure report that was given to you for any specific questions about what was found during the examination.  If the procedure report does not answer your questions, please call your gastroenterologist to clarify.  If you requested that your care partner not be given the details of your procedure findings, then the procedure report has been included in a sealed envelope for you to review at your convenience later.  YOU SHOULD EXPECT: Some feelings of bloating in the abdomen. Passage of more gas than usual.  Walking can help get rid of the air that was put into your GI tract during the procedure and reduce the bloating.   Please Note:  You might notice some irritation and congestion in your nose or some drainage.  This is from the oxygen used during your procedure.  There is no need for concern and it should clear up in a day or so.  SYMPTOMS TO REPORT IMMEDIATELY:   Following upper endoscopy (EGD)  Vomiting of blood or coffee ground material  New chest pain or pain under the shoulder blades  Painful or persistently difficult swallowing  New shortness of breath  Fever of 100F or higher  Black, tarry-looking stools  For urgent or emergent issues, a gastroenterologist can be reached at any hour by calling [phone]. Do not use MyChart messaging for urgent concerns.    DIET:  We do recommend a small meal at first, but then you may proceed to your regular diet.  Drink plenty of fluids but you should avoid alcoholic beverages for 24 hours.  ACTIVITY:  You should plan to take it easy for the rest of today and you should NOT DRIVE or use heavy machinery until tomorrow (because of the sedation  medicines used during the test).    FOLLOW UP: Our staff will call the number listed on your records the next business day following your procedure.  We will call around 7:15- 8:00 am to check on you and address any questions or concerns that you may have regarding the information given to you following your procedure. If we do not reach you, we will leave a message.     If any biopsies were taken you will be contacted by phone or by letter within the next 1-3 weeks.  Please call us at [phone] if you have not heard about the biopsies in 3 weeks.    SIGNATURES/CONFIDENTIALITY: You and/or your care partner have signed paperwork which will be entered into your electronic medical record.  These signatures attest to the fact that that the information above on your After Visit Summary has been reviewed and is understood.  Full responsibility of the confidentiality of this discharge information lies with you and/or your care-partner.

## 2022-10-25 NOTE — Progress Notes (Signed)
0936 Robinul 0.1 mg IV given due large amount of secretions upon assessment.  MD made aware, vss  ?

## 2022-10-25 NOTE — Progress Notes (Signed)
Called to room to assist during endoscopic procedure.  Patient ID and intended procedure confirmed with present staff. Received instructions for my participation in the procedure from the performing physician.  

## 2022-10-26 ENCOUNTER — Telehealth: Payer: Self-pay

## 2022-10-26 ENCOUNTER — Telehealth: Payer: Self-pay | Admitting: Internal Medicine

## 2022-10-26 NOTE — Telephone Encounter (Signed)
RN attempted follow up phone call; no answer; left VM

## 2022-10-26 NOTE — Telephone Encounter (Signed)
PT calling to give an update after procedure. She said she is feeling greeat and that we did an amazing job

## 2022-10-27 ENCOUNTER — Other Ambulatory Visit: Payer: Self-pay | Admitting: Internal Medicine

## 2022-10-27 ENCOUNTER — Other Ambulatory Visit (HOSPITAL_COMMUNITY): Payer: Self-pay

## 2022-10-27 DIAGNOSIS — K2970 Gastritis, unspecified, without bleeding: Secondary | ICD-10-CM

## 2022-10-27 DIAGNOSIS — K219 Gastro-esophageal reflux disease without esophagitis: Secondary | ICD-10-CM

## 2022-10-27 MED ORDER — FLUCONAZOLE 200 MG PO TABS
ORAL_TABLET | ORAL | 0 refills | Status: DC
  Filled 2022-10-27 – 2022-10-31 (×2): qty 15, 14d supply, fill #0

## 2022-10-30 ENCOUNTER — Other Ambulatory Visit (HOSPITAL_COMMUNITY): Payer: Self-pay

## 2022-10-30 ENCOUNTER — Encounter: Payer: Self-pay | Admitting: Internal Medicine

## 2022-10-31 ENCOUNTER — Other Ambulatory Visit: Payer: Self-pay

## 2022-10-31 ENCOUNTER — Other Ambulatory Visit (HOSPITAL_COMMUNITY): Payer: Self-pay

## 2022-10-31 MED ORDER — VALACYCLOVIR HCL 1 G PO TABS
1000.0000 mg | ORAL_TABLET | Freq: Every morning | ORAL | 1 refills | Status: DC
  Filled 2022-10-31: qty 90, 90d supply, fill #0
  Filled 2022-11-28 – 2022-12-05 (×2): qty 90, 90d supply, fill #1
  Filled 2022-12-06: qty 30, 30d supply, fill #1
  Filled 2022-12-07 – 2023-07-12 (×2): qty 90, 90d supply, fill #0
  Filled 2023-07-23: qty 30, 30d supply, fill #0

## 2022-11-02 ENCOUNTER — Other Ambulatory Visit (HOSPITAL_COMMUNITY): Payer: Self-pay

## 2022-11-04 ENCOUNTER — Other Ambulatory Visit (HOSPITAL_COMMUNITY): Payer: Self-pay

## 2022-11-06 ENCOUNTER — Other Ambulatory Visit (HOSPITAL_COMMUNITY): Payer: Self-pay

## 2022-11-06 MED ORDER — CLINDAMYCIN PHOSPHATE 1 % EX GEL
CUTANEOUS | 3 refills | Status: DC
  Filled 2022-11-06: qty 30, 84d supply, fill #0
  Filled 2022-11-22: qty 30, fill #0

## 2022-11-07 ENCOUNTER — Other Ambulatory Visit (HOSPITAL_COMMUNITY): Payer: Self-pay

## 2022-11-08 ENCOUNTER — Other Ambulatory Visit (HOSPITAL_COMMUNITY): Payer: Self-pay

## 2022-11-08 MED ORDER — KLOR-CON/EF 25 MEQ PO TBEF
EFFERVESCENT_TABLET | ORAL | 0 refills | Status: DC
  Filled 2022-11-08: qty 60, 30d supply, fill #0

## 2022-11-09 ENCOUNTER — Other Ambulatory Visit (HOSPITAL_COMMUNITY): Payer: Self-pay

## 2022-11-10 ENCOUNTER — Other Ambulatory Visit (HOSPITAL_COMMUNITY): Payer: Self-pay

## 2022-11-11 ENCOUNTER — Other Ambulatory Visit (HOSPITAL_COMMUNITY): Payer: Self-pay

## 2022-11-13 ENCOUNTER — Other Ambulatory Visit (HOSPITAL_COMMUNITY): Payer: Self-pay

## 2022-11-14 ENCOUNTER — Other Ambulatory Visit (HOSPITAL_BASED_OUTPATIENT_CLINIC_OR_DEPARTMENT_OTHER): Payer: Self-pay

## 2022-11-15 ENCOUNTER — Other Ambulatory Visit (HOSPITAL_COMMUNITY): Payer: Self-pay

## 2022-11-17 ENCOUNTER — Other Ambulatory Visit (HOSPITAL_COMMUNITY): Payer: Self-pay

## 2022-11-17 MED ORDER — MEDROXYPROGESTERONE ACETATE 10 MG PO TABS
10.0000 mg | ORAL_TABLET | Freq: Every evening | ORAL | 0 refills | Status: DC
  Filled 2022-11-17: qty 10, 10d supply, fill #0

## 2022-11-18 ENCOUNTER — Other Ambulatory Visit (HOSPITAL_COMMUNITY): Payer: Self-pay

## 2022-11-20 ENCOUNTER — Other Ambulatory Visit (HOSPITAL_COMMUNITY): Payer: Self-pay

## 2022-11-22 ENCOUNTER — Other Ambulatory Visit (HOSPITAL_COMMUNITY): Payer: Self-pay

## 2022-11-22 ENCOUNTER — Other Ambulatory Visit: Payer: Self-pay

## 2022-11-23 ENCOUNTER — Other Ambulatory Visit (HOSPITAL_COMMUNITY): Payer: Self-pay

## 2022-11-24 ENCOUNTER — Other Ambulatory Visit (HOSPITAL_COMMUNITY): Payer: Self-pay

## 2022-11-25 ENCOUNTER — Other Ambulatory Visit (HOSPITAL_COMMUNITY): Payer: Self-pay

## 2022-11-27 ENCOUNTER — Other Ambulatory Visit: Payer: Self-pay

## 2022-11-28 ENCOUNTER — Other Ambulatory Visit: Payer: Self-pay

## 2022-11-28 ENCOUNTER — Other Ambulatory Visit (HOSPITAL_COMMUNITY): Payer: Self-pay

## 2022-11-29 ENCOUNTER — Other Ambulatory Visit (HOSPITAL_COMMUNITY): Payer: Self-pay

## 2022-12-01 ENCOUNTER — Other Ambulatory Visit (HOSPITAL_COMMUNITY): Payer: Self-pay

## 2022-12-02 ENCOUNTER — Other Ambulatory Visit (HOSPITAL_COMMUNITY): Payer: Self-pay

## 2022-12-04 ENCOUNTER — Other Ambulatory Visit (HOSPITAL_COMMUNITY): Payer: Self-pay

## 2022-12-05 ENCOUNTER — Other Ambulatory Visit: Payer: Self-pay

## 2022-12-05 ENCOUNTER — Other Ambulatory Visit: Payer: Self-pay | Admitting: Internal Medicine

## 2022-12-05 ENCOUNTER — Other Ambulatory Visit (HOSPITAL_COMMUNITY): Payer: Self-pay

## 2022-12-05 DIAGNOSIS — K2970 Gastritis, unspecified, without bleeding: Secondary | ICD-10-CM

## 2022-12-05 DIAGNOSIS — K219 Gastro-esophageal reflux disease without esophagitis: Secondary | ICD-10-CM

## 2022-12-06 ENCOUNTER — Other Ambulatory Visit (HOSPITAL_COMMUNITY): Payer: Self-pay

## 2022-12-07 ENCOUNTER — Other Ambulatory Visit (HOSPITAL_COMMUNITY): Payer: Self-pay

## 2022-12-07 ENCOUNTER — Other Ambulatory Visit: Payer: Self-pay

## 2022-12-07 MED ORDER — LIDOCAINE 5 % EX OINT
TOPICAL_OINTMENT | CUTANEOUS | 1 refills | Status: DC
  Filled 2022-12-07: qty 50, 14d supply, fill #0
  Filled 2023-07-12: qty 50, 14d supply, fill #1
  Filled 2023-07-28 – 2023-09-17 (×2): qty 50, 30d supply, fill #1

## 2022-12-07 MED ORDER — FLUCONAZOLE 150 MG PO TABS
150.0000 mg | ORAL_TABLET | ORAL | 0 refills | Status: DC
  Filled 2022-12-07: qty 2, 3d supply, fill #0

## 2022-12-07 MED ORDER — VALACYCLOVIR HCL 1 G PO TABS
1000.0000 mg | ORAL_TABLET | Freq: Two times a day (BID) | ORAL | 11 refills | Status: DC
  Filled 2022-12-07: qty 60, 30d supply, fill #0

## 2022-12-08 ENCOUNTER — Other Ambulatory Visit (HOSPITAL_COMMUNITY): Payer: Self-pay

## 2022-12-08 MED ORDER — ALPRAZOLAM 2 MG PO TABS
2.0000 mg | ORAL_TABLET | Freq: Every day | ORAL | 0 refills | Status: DC
  Filled 2022-12-08: qty 30, 30d supply, fill #0

## 2022-12-08 MED ORDER — ALPRAZOLAM 1 MG PO TABS
1.0000 mg | ORAL_TABLET | Freq: Every morning | ORAL | 0 refills | Status: DC
  Filled 2022-12-08 – 2023-01-03 (×3): qty 30, 30d supply, fill #0

## 2022-12-08 MED ORDER — VALACYCLOVIR HCL 1 G PO TABS
1000.0000 mg | ORAL_TABLET | Freq: Every morning | ORAL | 1 refills | Status: DC
  Filled 2022-12-08 – 2022-12-22 (×2): qty 90, 90d supply, fill #0
  Filled 2023-07-12: qty 30, 30d supply, fill #0

## 2022-12-11 ENCOUNTER — Other Ambulatory Visit (HOSPITAL_COMMUNITY): Payer: Self-pay

## 2022-12-11 MED ORDER — LIDOCAINE 5 % EX PTCH
1.0000 | MEDICATED_PATCH | Freq: Every day | CUTANEOUS | 3 refills | Status: DC
  Filled 2022-12-11: qty 30, 30d supply, fill #0

## 2022-12-13 ENCOUNTER — Other Ambulatory Visit: Payer: Self-pay | Admitting: Internal Medicine

## 2022-12-13 DIAGNOSIS — K219 Gastro-esophageal reflux disease without esophagitis: Secondary | ICD-10-CM

## 2022-12-13 DIAGNOSIS — K2970 Gastritis, unspecified, without bleeding: Secondary | ICD-10-CM

## 2022-12-14 ENCOUNTER — Other Ambulatory Visit (HOSPITAL_COMMUNITY): Payer: Self-pay

## 2022-12-18 ENCOUNTER — Other Ambulatory Visit: Payer: Self-pay

## 2022-12-18 ENCOUNTER — Other Ambulatory Visit (HOSPITAL_COMMUNITY): Payer: Self-pay

## 2022-12-18 ENCOUNTER — Other Ambulatory Visit: Payer: Self-pay | Admitting: Internal Medicine

## 2022-12-18 DIAGNOSIS — K2970 Gastritis, unspecified, without bleeding: Secondary | ICD-10-CM

## 2022-12-18 DIAGNOSIS — K219 Gastro-esophageal reflux disease without esophagitis: Secondary | ICD-10-CM

## 2022-12-22 ENCOUNTER — Other Ambulatory Visit (HOSPITAL_COMMUNITY): Payer: Self-pay

## 2022-12-22 MED ORDER — MEDROXYPROGESTERONE ACETATE 10 MG PO TABS
10.0000 mg | ORAL_TABLET | Freq: Every evening | ORAL | 0 refills | Status: DC
  Filled 2022-12-22 – 2023-01-15 (×3): qty 10, 10d supply, fill #0

## 2022-12-23 ENCOUNTER — Other Ambulatory Visit (HOSPITAL_COMMUNITY): Payer: Self-pay

## 2022-12-25 ENCOUNTER — Other Ambulatory Visit (HOSPITAL_COMMUNITY): Payer: Self-pay

## 2022-12-26 ENCOUNTER — Other Ambulatory Visit (HOSPITAL_COMMUNITY): Payer: Self-pay

## 2022-12-27 ENCOUNTER — Ambulatory Visit: Payer: Commercial Managed Care - PPO | Admitting: Internal Medicine

## 2022-12-27 ENCOUNTER — Other Ambulatory Visit (HOSPITAL_COMMUNITY): Payer: Self-pay

## 2022-12-27 MED ORDER — FLUCONAZOLE 150 MG PO TABS
150.0000 mg | ORAL_TABLET | ORAL | 0 refills | Status: DC
  Filled 2022-12-27: qty 2, 3d supply, fill #0
  Filled 2023-07-28: qty 2, 2d supply, fill #0
  Filled 2023-09-17: qty 2, 20d supply, fill #0

## 2022-12-28 ENCOUNTER — Other Ambulatory Visit (HOSPITAL_COMMUNITY): Payer: Self-pay

## 2023-01-01 ENCOUNTER — Other Ambulatory Visit (HOSPITAL_COMMUNITY): Payer: Self-pay

## 2023-01-03 ENCOUNTER — Other Ambulatory Visit (HOSPITAL_COMMUNITY): Payer: Self-pay

## 2023-01-04 ENCOUNTER — Other Ambulatory Visit: Payer: Self-pay

## 2023-01-04 ENCOUNTER — Other Ambulatory Visit (HOSPITAL_COMMUNITY): Payer: Self-pay

## 2023-01-05 ENCOUNTER — Other Ambulatory Visit (HOSPITAL_COMMUNITY): Payer: Self-pay

## 2023-01-08 ENCOUNTER — Other Ambulatory Visit (HOSPITAL_COMMUNITY): Payer: Self-pay

## 2023-01-08 MED ORDER — AMITRIPTYLINE HCL 75 MG PO TABS
75.0000 mg | ORAL_TABLET | Freq: Every day | ORAL | 1 refills | Status: DC
  Filled 2023-01-08: qty 90, 90d supply, fill #0
  Filled 2023-09-17: qty 30, 30d supply, fill #1

## 2023-01-08 MED ORDER — ALPRAZOLAM 2 MG PO TABS
2.0000 mg | ORAL_TABLET | Freq: Every day | ORAL | 0 refills | Status: DC
  Filled 2023-01-08: qty 30, 30d supply, fill #0

## 2023-01-08 MED ORDER — CLOTRIMAZOLE 10 MG MT TROC
10.0000 mg | Freq: Three times a day (TID) | OROMUCOSAL | 0 refills | Status: DC
  Filled 2023-01-08: qty 30, 10d supply, fill #0

## 2023-01-08 MED ORDER — FLUCONAZOLE 150 MG PO TABS
150.0000 mg | ORAL_TABLET | Freq: Every day | ORAL | 0 refills | Status: AC
  Filled 2023-01-08: qty 2, 2d supply, fill #0

## 2023-01-08 MED ORDER — METHYLPREDNISOLONE 4 MG PO TBPK
ORAL_TABLET | ORAL | 0 refills | Status: DC
  Filled 2023-01-08: qty 21, 6d supply, fill #0

## 2023-01-09 ENCOUNTER — Other Ambulatory Visit (HOSPITAL_COMMUNITY): Payer: Self-pay

## 2023-01-11 ENCOUNTER — Other Ambulatory Visit (HOSPITAL_COMMUNITY): Payer: Self-pay

## 2023-01-15 ENCOUNTER — Other Ambulatory Visit (HOSPITAL_COMMUNITY): Payer: Self-pay

## 2023-01-15 ENCOUNTER — Other Ambulatory Visit: Payer: Self-pay

## 2023-01-15 MED ORDER — AMOXICILLIN-POT CLAVULANATE 875-125 MG PO TABS
1.0000 | ORAL_TABLET | Freq: Two times a day (BID) | ORAL | 0 refills | Status: DC
  Filled 2023-01-15 – 2023-02-13 (×2): qty 14, 7d supply, fill #0

## 2023-01-16 ENCOUNTER — Emergency Department (HOSPITAL_COMMUNITY)
Admission: EM | Admit: 2023-01-16 | Discharge: 2023-01-17 | Disposition: A | Discharge status: Home / Self Care | Payer: Self-pay | Attending: Emergency Medicine | Admitting: Emergency Medicine

## 2023-01-16 ENCOUNTER — Encounter (HOSPITAL_COMMUNITY): Payer: Self-pay

## 2023-01-16 DIAGNOSIS — M25522 Pain in left elbow: Secondary | ICD-10-CM | POA: Insufficient documentation
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
## 2023-01-17 MED ORDER — KETOROLAC TROMETHAMINE 60 MG/2ML IM SOLN
30.0000 mg | Freq: Once | INTRAMUSCULAR | Status: AC
  Administered 2023-01-17: 30 mg via INTRAMUSCULAR
  Filled 2023-01-17: qty 2

## 2023-01-17 NOTE — Discharge Instructions (Signed)
It was a pleasure taking care of you today. As discussed, please follow up with primary care and physical therapy as this will provide the best pain relief for chronic pain in the long term. We have given you a dose of pain medicine here in the ED that should help make pain manageable until your primary care appointment tomorrow.  Seek emergency care if experiencing new or worsening symptoms.

## 2023-01-25 ENCOUNTER — Other Ambulatory Visit (HOSPITAL_COMMUNITY): Payer: Self-pay

## 2023-02-03 ENCOUNTER — Emergency Department (HOSPITAL_COMMUNITY): Payer: Self-pay

## 2023-02-03 ENCOUNTER — Encounter (HOSPITAL_COMMUNITY): Payer: Self-pay | Admitting: Emergency Medicine

## 2023-02-03 ENCOUNTER — Emergency Department (HOSPITAL_COMMUNITY)
Admission: EM | Admit: 2023-02-03 | Discharge: 2023-02-04 | Disposition: A | Discharge status: Home / Self Care | Payer: Self-pay | Attending: Emergency Medicine | Admitting: Emergency Medicine

## 2023-02-03 ENCOUNTER — Other Ambulatory Visit: Payer: Self-pay

## 2023-02-03 DIAGNOSIS — R202 Paresthesia of skin: Secondary | ICD-10-CM | POA: Insufficient documentation

## 2023-02-03 DIAGNOSIS — M25522 Pain in left elbow: Secondary | ICD-10-CM | POA: Insufficient documentation

## 2023-02-03 DIAGNOSIS — Y990 Civilian activity done for income or pay: Secondary | ICD-10-CM | POA: Insufficient documentation

## 2023-02-03 DIAGNOSIS — X503XXA Overexertion from repetitive movements, initial encounter: Secondary | ICD-10-CM | POA: Insufficient documentation

## 2023-02-03 MED ORDER — GABAPENTIN 300 MG PO CAPS: 300.0000 mg | ORAL_CAPSULE | Freq: Two times a day (BID) | ORAL | 0 refills | Status: AC

## 2023-02-03 NOTE — ED Provider Notes (Signed)
[place] EMERGENCY DEPARTMENT AT [facility] Provider Note   CSN: [id number] Arrival date & time: 02/03/23  2233     History  Chief Complaint  Patient presents with   Elbow Pain    Christina [name] is a 42 y.o. female.  The history is provided by the patient and medical records.   42 y.o. F with hx of migraine headaches, GERD, presenting to the ED for left elbow pain.  Patient states ongoing issues with left elbow issues over the past 2 years.  She reports remote injury x2, nothing recent.  Patient states recently she has been having some numbness/paresthesias radiating down the arm from the elbow and into the 4th and 5th digits.  She did try taking some leftover gabapentin and Tylenol recently which seemed to help a lot with her pain.  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  Home Medications Prior to Admission medications   Medication Sig Start Date End Date Taking? Authorizing Provider  gabapentin (NEURONTIN) 300 MG capsule Take 1 capsule (300 mg total) by mouth 2 (two) times daily. 02/03/23  Yes Garlon Hatchet, PA-C  ALPRAZolam Prudy Feeler) 1 MG tablet Take 1 tablet (1 mg total) by mouth in the morning. 12/07/22     alprazolam (XANAX) 2 MG tablet Take 1 tablet (2 mg total) by mouth nightly at bedtime 07/11/22     alprazolam (XANAX) 2 MG tablet Take 1 tablet (2 mg total) by mouth at bedtime. 01/08/23     amitriptyline (ELAVIL) 75 MG tablet Take 1 tablet (75 mg total) by mouth at bedtime. 07/09/17   Junie Spencer, FNP  amitriptyline (ELAVIL) 75 MG tablet Take 1 tablet (75 mg total) by mouth daily. 01/08/23     amoxicillin-clavulanate (AUGMENTIN) 875-125 MG tablet Take 1 tablet by mouth every 12 (twelve) hours as directed for 7 days 01/15/23     clindamycin (CLEOCIN T) 1 % external solution Apply topically 2 (two) times daily. 09/27/22     clindamycin (CLINDAGEL) 1 % gel Apply topically 2 times daily. 01/20/22     Clindamycin  Phosphate (XACIATO) 2 % GEL Insert 1 applicatorful vaginally once a week for 12 weeks 10/19/22     clotrimazole (MYCELEX) 10 MG troche Take 1 tablet (10 mg total) by mouth 3 (three) times daily as directed for 10 days. 01/08/23     dexlansoprazole (DEXILANT) 60 MG capsule Take 1 capsule (60 mg total) by mouth daily as directed 09/08/22     dexlansoprazole (DEXILANT) 60 MG capsule Take 1 capsule (60 mg total) by mouth daily as directed 09/27/22     famotidine (PEPCID) 40 MG tablet Take 1 tablet (40 mg total) by mouth 2 (two) times daily. 10/17/22   Unk Lightning, PA  fluconazole (DIFLUCAN) 150 MG tablet Take 1 tablet (150 mg total) by mouth then repeat in 3 days as directed. 12/27/22     fluconazole (DIFLUCAN) 150 MG tablet Take 1 tablet (150 mg total) by mouth daily for 2 days 01/08/23     fluconazole (DIFLUCAN) 200 MG tablet Take 2 tablets daily for 1 dose, followed by 1 tablet daily for 13 days. 10/27/22   Imogene Burn, MD  hydrocortisone (ANUSOL-HC) 2.5 % rectal cream Apply 1 application topically to the affected area(s) daily. 02/15/21     hydrocortisone (PROCTOZONE-HC) 2.5 % rectal cream Apply a thin layer to the affected areas twice a day to four times a  day 09/27/22     lidocaine (LIDODERM) 5 % Place 1 patch onto the skin daily. Remove & Discard patch within 12 hours or as directed by MD 03/13/22   Kommor, Wyn Forster, MD  lidocaine (LIDODERM) 5 % Place 1 patch onto the skin daily. May wear up to 12 hours. 12/11/22     lidocaine (XYLOCAINE) 5 % ointment Apply to affected area 3 times a day as needed for 14 days 12/07/22     linaclotide (LINZESS) 145 MCG CAPS capsule Take 1 capsule (145 mcg total) by mouth daily for constipation as directed 09/27/22     linaclotide (LINZESS) 72 MCG capsule Take 1 capsule (72 mcg total) by mouth daily as directed. 07/11/22     medroxyPROGESTERone (PROVERA) 10 MG tablet Take 1 tablet (10 mg total) by mouth with food at bedtime. 12/22/22     methylPREDNISolone (MEDROL  DOSEPAK) 4 MG TBPK tablet Take as directed per package directions 01/08/23     metoprolol tartrate (LOPRESSOR) 50 MG tablet Take 1 tablet (50 mg total) by mouth 2 (two) times daily as directed 01/16/22     metroNIDAZOLE (METROGEL) 0.75 % vaginal gel Place 1 Applicatorful vaginally every week for 90 days 09/27/22     nitroGLYCERIN (NITROSTAT) 0.4 MG SL tablet Place 1 tablet (0.4 mg total) under the tongue as needed. 09/12/22     nystatin (MYCOSTATIN) 100000 UNIT/ML suspension swish and spit 5 mLs (500,000 Units total) by mouth 3 (three) times daily. 09/08/22     potassium bicarbonate (KLOR-CON/EF) 25 MEQ disintegrating tablet Dissolve 1 tablet in water and drink 2 times a day with meals 11/08/22     propranolol (INDERAL) 10 MG tablet Take 1 tablet (10 mg total) by mouth 3 (three) times daily as needed. 09/26/22   Custovic, Rozell Searing, DO  sucralfate (CARAFATE) 1 g tablet Take 1 tablet (1 g total) by mouth 3 (three) times daily before meals. 07/11/22     valACYclovir (VALTREX) 1000 MG tablet Take 1 tablet (1,000 mg total) by mouth every morning. 10/31/22     valACYclovir (VALTREX) 1000 MG tablet Take 1 tablet (1,000 mg total) by mouth 2 (two) times daily for outbreaks then use daily 12/07/22     valACYclovir (VALTREX) 1000 MG tablet Take 1 tablet (1,000 mg total) by mouth in the morning. 12/07/22         Allergies    Patient has no known allergies.    Review of Systems   Review of Systems  Musculoskeletal:  Positive for arthralgias.  All other systems reviewed and are negative.   Physical Exam Updated Vital Signs BP 115/63 (BP Location: Left Arm)   Pulse 85   Temp 98.3 F (36.8 C) (Oral)   Resp 18   Ht 5\' 2"  (1.575 m)   Wt 53.5 kg   LMP 01/16/2023 (Exact Date)   SpO2 100%   BMI 21.58 kg/m   Physical Exam Vitals and nursing note reviewed.  Constitutional:      Appearance: She is well-developed.  HENT:     Head: Normocephalic and atraumatic.  Eyes:     Conjunctiva/sclera: Conjunctivae  normal.     Pupils: Pupils are equal, round, and reactive to light.  Cardiovascular:     Rate and Rhythm: Normal rate and regular rhythm.     Heart sounds: Normal heart sounds.  Pulmonary:     Effort: Pulmonary effort is normal.     Breath sounds: Normal breath sounds.  Musculoskeletal:  General: Normal range of motion.     Cervical back: Normal range of motion.     Comments: Left elbow is normal in appearance, there is no swelling or acute deformity, able to flex and extend without issue, tapping on olecranon does induce some tingling down the ulnar aspect of the arm and into the left 4th and 5th digits, radial pulse intact, normal grip strength  Skin:    General: Skin is warm and dry.  Neurological:     Mental Status: She is alert and oriented to person, place, and time.     ED Results / Procedures / Treatments   Labs (all labs ordered are listed, but only abnormal results are displayed) Labs Reviewed - No data to display  EKG None  Radiology DG Elbow Complete Left  Result Date: 02/03/2023 CLINICAL DATA:  Elbow pain, numbness, tingling. Remote elbow injuries and told she had a "floating bone" in her elbow EXAM: LEFT ELBOW - COMPLETE 3+ VIEW COMPARISON:  None Available. FINDINGS: There is no evidence of acute fracture, dislocation, or joint effusion. There is a tiny 1 mm osseous fragment adjacent to the coronoid process. Soft tissues are unremarkable. IMPRESSION: 1. No evidence of acute fracture or dislocation. 2. Tiny 1 mm osseous fragment adjacent to the coronoid process, likely due to remote trauma. Electronically Signed   By: Minerva Fester M.D.   On: 02/03/2023 23:08    Procedures Procedures    Medications Ordered in ED Medications - No data to display  ED Course/ Medical Decision Making/ A&P                             Medical Decision Making Amount and/or Complexity of Data Reviewed Radiology: ordered.  Risk Prescription drug management.   42 year old  female here with left elbow pain.  History of remote injuries but nothing acute.  Elbow is without any erythema, swelling, or deformity on exam.  Her arm is neurovascular intact.  Tapping on her olecranon does induce some tingling into the left fourth and fifth digits.  Question some ulnar nerve irritation/entrapment.  X-ray with small osseous fragment, likely related to remote trauma.  Patient reports history of same.  As she did have improvement with gabapentin previously, feel it is reasonable for trial of this again.  Will refer to orthopedics if ongoing issues.  Can follow-up with PCP in the interim.  Can return here for any new or acute changes.  Final Clinical Impression(s) / ED Diagnoses Final diagnoses:  Left elbow pain    Rx / DC Orders ED Discharge Orders          Ordered    gabapentin (NEURONTIN) 300 MG capsule  2 times daily        02/03/23 2357              Garlon Hatchet, PA-C 02/04/23 0006    Tilden Fossa, MD 02/04/23 [phone]

## 2023-02-03 NOTE — ED Triage Notes (Signed)
Patient injured her elbow 2 years ago and then again 1 years ago. She was originally told she had a "floating bone" in her elbow. Today she complains of elbow pain, numbness and tingling. She requests an x ray of her left elbow.

## 2023-02-03 NOTE — Discharge Instructions (Signed)
Take the prescribed medication as directed.  Can continue tylenol or motrin with this if needed. Follow-up with orthopedics if ongoing issues-- can call and set up appt. Can see your primary care doctor in the interim if needed. Return to the ED for new or worsening symptoms.

## 2023-02-08 ENCOUNTER — Other Ambulatory Visit (HOSPITAL_COMMUNITY): Payer: Self-pay

## 2023-02-13 ENCOUNTER — Other Ambulatory Visit (HOSPITAL_COMMUNITY): Payer: Self-pay

## 2023-02-13 MED ORDER — ALPRAZOLAM 2 MG PO TABS
2.0000 mg | ORAL_TABLET | Freq: Every evening | ORAL | 0 refills | Status: DC
  Filled 2023-02-13: qty 30, 30d supply, fill #0

## 2023-02-13 MED ORDER — ALPRAZOLAM 1 MG PO TABS
1.0000 mg | ORAL_TABLET | Freq: Every morning | ORAL | 0 refills | Status: DC
  Filled 2023-02-13: qty 30, 30d supply, fill #0

## 2023-02-25 ENCOUNTER — Emergency Department (HOSPITAL_COMMUNITY): Payer: Self-pay | Admitting: Certified Registered Nurse Anesthetist

## 2023-02-25 ENCOUNTER — Encounter (HOSPITAL_COMMUNITY)
Admission: EM | Disposition: A | Discharge status: Home / Self Care | Payer: Self-pay | Source: Home / Self Care | Attending: Emergency Medicine

## 2023-02-25 ENCOUNTER — Emergency Department (EMERGENCY_DEPARTMENT_HOSPITAL): Payer: Self-pay | Admitting: Certified Registered Nurse Anesthetist

## 2023-02-25 ENCOUNTER — Ambulatory Visit (HOSPITAL_COMMUNITY)
Admission: EM | Admit: 2023-02-25 | Discharge: 2023-02-25 | Disposition: A | Discharge status: Home / Self Care | Payer: Self-pay | Attending: Emergency Medicine | Admitting: Emergency Medicine

## 2023-02-25 ENCOUNTER — Other Ambulatory Visit: Payer: Self-pay

## 2023-02-25 ENCOUNTER — Encounter (HOSPITAL_COMMUNITY): Payer: Self-pay

## 2023-02-25 ENCOUNTER — Emergency Department (HOSPITAL_COMMUNITY): Payer: Self-pay

## 2023-02-25 DIAGNOSIS — Z79899 Other long term (current) drug therapy: Secondary | ICD-10-CM | POA: Insufficient documentation

## 2023-02-25 DIAGNOSIS — I10 Essential (primary) hypertension: Secondary | ICD-10-CM | POA: Insufficient documentation

## 2023-02-25 DIAGNOSIS — G43909 Migraine, unspecified, not intractable, without status migrainosus: Secondary | ICD-10-CM | POA: Insufficient documentation

## 2023-02-25 DIAGNOSIS — Z8249 Family history of ischemic heart disease and other diseases of the circulatory system: Secondary | ICD-10-CM | POA: Insufficient documentation

## 2023-02-25 DIAGNOSIS — F419 Anxiety disorder, unspecified: Secondary | ICD-10-CM | POA: Insufficient documentation

## 2023-02-25 DIAGNOSIS — K36 Other appendicitis: Secondary | ICD-10-CM | POA: Insufficient documentation

## 2023-02-25 DIAGNOSIS — D649 Anemia, unspecified: Secondary | ICD-10-CM

## 2023-02-25 DIAGNOSIS — K3580 Unspecified acute appendicitis: Secondary | ICD-10-CM

## 2023-02-25 HISTORY — PX: LAPAROSCOPIC APPENDECTOMY: SHX408

## 2023-02-25 LAB — URINALYSIS, ROUTINE W REFLEX MICROSCOPIC
Bilirubin Urine: NEGATIVE
Glucose, UA: NEGATIVE mg/dL
Hgb urine dipstick: NEGATIVE
Ketones, ur: NEGATIVE mg/dL
Leukocytes,Ua: NEGATIVE
Nitrite: NEGATIVE
Protein, ur: NEGATIVE mg/dL
Specific Gravity, Urine: 1.014 (ref 1.005–1.030)
pH: 8 (ref 5.0–8.0)

## 2023-02-25 LAB — COMPREHENSIVE METABOLIC PANEL
ALT: 11 U/L (ref 0–44)
AST: 15 U/L (ref 15–41)
Albumin: 3.8 g/dL (ref 3.5–5.0)
Alkaline Phosphatase: 61 U/L (ref 38–126)
Anion gap: 7 (ref 5–15)
BUN: <5 mg/dL — ABNORMAL LOW (ref 6–20)
CO2: 26 mmol/L (ref 22–32)
Calcium: 8.6 mg/dL — ABNORMAL LOW (ref 8.9–10.3)
Chloride: 101 mmol/L (ref 98–111)
Creatinine, Ser: 0.66 mg/dL (ref 0.44–1.00)
GFR, Estimated: >60 mL/min (ref >60)
Glucose, Bld: 144 mg/dL — ABNORMAL HIGH (ref 70–99)
Potassium: 3.6 mmol/L (ref 3.5–5.1)
Sodium: 134 mmol/L — ABNORMAL LOW (ref 135–145)
Total Bilirubin: 0.7 mg/dL (ref 0.3–1.2)
Total Protein: 7.5 g/dL (ref 6.5–8.1)

## 2023-02-25 LAB — CBC
HCT: 35.9 % — ABNORMAL LOW (ref 36.0–46.0)
Hemoglobin: 11.8 g/dL — ABNORMAL LOW (ref 12.0–15.0)
MCH: 31.9 pg (ref 26.0–34.0)
MCHC: 32.9 g/dL (ref 30.0–36.0)
MCV: 97 fL (ref 80.0–100.0)
Platelets: 227 10*3/uL (ref 150–400)
RBC: 3.7 MIL/uL — ABNORMAL LOW (ref 3.87–5.11)
RDW: 11.3 % — ABNORMAL LOW (ref 11.5–15.5)
WBC: 14.2 10*3/uL — ABNORMAL HIGH (ref 4.0–10.5)
nRBC: 0 % (ref 0.0–0.2)

## 2023-02-25 LAB — I-STAT BETA HCG BLOOD, ED (MC, WL, AP ONLY): I-stat hCG, quantitative: <5 m[IU]/mL (ref <5)

## 2023-02-25 LAB — LIPASE, BLOOD: Lipase: 26 U/L (ref 11–51)

## 2023-02-25 SURGERY — APPENDECTOMY, LAPAROSCOPIC
Anesthesia: General | Site: Abdomen

## 2023-02-25 MED ORDER — ONDANSETRON HCL 4 MG/2ML IJ SOLN
4.0000 mg | Freq: Once | INTRAMUSCULAR | Status: AC | PRN
  Administered 2023-02-25: 4 mg via INTRAVENOUS
  Filled 2023-02-25: qty 2

## 2023-02-25 MED ORDER — METRONIDAZOLE 500 MG/100ML IV SOLN
500.0000 mg | Freq: Once | INTRAVENOUS | Status: AC
  Administered 2023-02-25: 500 mg via INTRAVENOUS
  Filled 2023-02-25: qty 100

## 2023-02-25 MED ORDER — LACTATED RINGERS IR SOLN
Status: DC | PRN
  Administered 2023-02-25: 1000 mL

## 2023-02-25 MED ORDER — DEXAMETHASONE SODIUM PHOSPHATE 10 MG/ML IJ SOLN
INTRAMUSCULAR | Status: DC | PRN
  Administered 2023-02-25: 10 mg via INTRAVENOUS

## 2023-02-25 MED ORDER — FENTANYL CITRATE (PF) 100 MCG/2ML IJ SOLN
INTRAMUSCULAR | Status: AC
  Filled 2023-02-25: qty 2

## 2023-02-25 MED ORDER — FENTANYL CITRATE (PF) 100 MCG/2ML IJ SOLN
INTRAMUSCULAR | Status: DC | PRN
  Administered 2023-02-25: 50 ug via INTRAVENOUS
  Administered 2023-02-25: 100 ug via INTRAVENOUS

## 2023-02-25 MED ORDER — ACETAMINOPHEN 10 MG/ML IV SOLN
1000.0000 mg | Freq: Once | INTRAVENOUS | Status: DC | PRN
  Administered 2023-02-25: 1000 mg via INTRAVENOUS

## 2023-02-25 MED ORDER — ACETAMINOPHEN 500 MG PO TABS: 1000.0000 mg | ORAL_TABLET | ORAL | Status: DC

## 2023-02-25 MED ORDER — DEXAMETHASONE SODIUM PHOSPHATE 10 MG/ML IJ SOLN
INTRAMUSCULAR | Status: AC
  Filled 2023-02-25: qty 1

## 2023-02-25 MED ORDER — SODIUM CHLORIDE (PF) 0.9 % IJ SOLN
INTRAMUSCULAR | Status: AC
  Filled 2023-02-25: qty 50

## 2023-02-25 MED ORDER — SODIUM CHLORIDE 0.9 % IV BOLUS
1000.0000 mL | Freq: Once | INTRAVENOUS | Status: AC
  Administered 2023-02-25: 1000 mL via INTRAVENOUS

## 2023-02-25 MED ORDER — SODIUM CHLORIDE 0.9 % IV SOLN
2.0000 g | Freq: Once | INTRAVENOUS | Status: AC
  Administered 2023-02-25: 2 g via INTRAVENOUS
  Filled 2023-02-25: qty 20

## 2023-02-25 MED ORDER — OXYCODONE HCL 5 MG PO TABS: 5.0000 mg | ORAL_TABLET | Freq: Once | ORAL | Status: DC | PRN

## 2023-02-25 MED ORDER — OXYCODONE HCL 5 MG/5ML PO SOLN: 5.0000 mg | Freq: Once | ORAL | Status: DC | PRN

## 2023-02-25 MED ORDER — MORPHINE SULFATE (PF) 4 MG/ML IV SOLN: 4.0000 mg | Freq: Once | INTRAVENOUS | Status: DC

## 2023-02-25 MED ORDER — MIDAZOLAM HCL 5 MG/5ML IJ SOLN
INTRAMUSCULAR | Status: DC | PRN
  Administered 2023-02-25: 2 mg via INTRAVENOUS

## 2023-02-25 MED ORDER — DEXMEDETOMIDINE HCL IN NACL 80 MCG/20ML IV SOLN
INTRAVENOUS | Status: DC | PRN
  Administered 2023-02-25 (×2): 8 ug via INTRAVENOUS

## 2023-02-25 MED ORDER — SUGAMMADEX SODIUM 200 MG/2ML IV SOLN
INTRAVENOUS | Status: DC | PRN
  Administered 2023-02-25: 200 mg via INTRAVENOUS

## 2023-02-25 MED ORDER — ONDANSETRON HCL 4 MG/2ML IJ SOLN
INTRAMUSCULAR | Status: DC | PRN
  Administered 2023-02-25: 4 mg via INTRAVENOUS

## 2023-02-25 MED ORDER — LIDOCAINE 2% (20 MG/ML) 5 ML SYRINGE
INTRAMUSCULAR | Status: DC | PRN
  Administered 2023-02-25: 60 mg via INTRAVENOUS

## 2023-02-25 MED ORDER — ONDANSETRON HCL 4 MG/2ML IJ SOLN
4.0000 mg | Freq: Once | INTRAMUSCULAR | Status: AC
  Administered 2023-02-25: 4 mg via INTRAVENOUS
  Filled 2023-02-25: qty 2

## 2023-02-25 MED ORDER — KETOROLAC TROMETHAMINE 30 MG/ML IJ SOLN
30.0000 mg | Freq: Once | INTRAMUSCULAR | Status: AC | PRN
  Administered 2023-02-25: 30 mg via INTRAVENOUS

## 2023-02-25 MED ORDER — BUPIVACAINE HCL (PF) 0.5 % IJ SOLN
INTRAMUSCULAR | Status: AC
  Filled 2023-02-25: qty 30

## 2023-02-25 MED ORDER — METHOCARBAMOL 750 MG PO TABS: 750.0000 mg | ORAL_TABLET | Freq: Three times a day (TID) | ORAL | 0 refills | Status: DC | PRN

## 2023-02-25 MED ORDER — IOHEXOL 300 MG/ML  SOLN
80.0000 mL | Freq: Once | INTRAMUSCULAR | Status: AC | PRN
  Administered 2023-02-25: 80 mL via INTRAVENOUS

## 2023-02-25 MED ORDER — FENTANYL CITRATE PF 50 MCG/ML IJ SOSY
PREFILLED_SYRINGE | INTRAMUSCULAR | Status: AC
  Filled 2023-02-25: qty 1

## 2023-02-25 MED ORDER — PROPOFOL 10 MG/ML IV BOLUS
INTRAVENOUS | Status: DC | PRN
  Administered 2023-02-25: 150 mg via INTRAVENOUS

## 2023-02-25 MED ORDER — KETOROLAC TROMETHAMINE 30 MG/ML IJ SOLN
INTRAMUSCULAR | Status: AC
  Filled 2023-02-25: qty 1

## 2023-02-25 MED ORDER — ONDANSETRON HCL 4 MG/2ML IJ SOLN
INTRAMUSCULAR | Status: AC
  Filled 2023-02-25: qty 2

## 2023-02-25 MED ORDER — ROCURONIUM BROMIDE 10 MG/ML (PF) SYRINGE
PREFILLED_SYRINGE | INTRAVENOUS | Status: DC | PRN
  Administered 2023-02-25: 40 mg via INTRAVENOUS

## 2023-02-25 MED ORDER — FENTANYL CITRATE PF 50 MCG/ML IJ SOSY
25.0000 ug | PREFILLED_SYRINGE | INTRAMUSCULAR | Status: DC | PRN
  Administered 2023-02-25: 50 ug via INTRAVENOUS

## 2023-02-25 MED ORDER — FENTANYL CITRATE PF 50 MCG/ML IJ SOSY
50.0000 ug | PREFILLED_SYRINGE | Freq: Once | INTRAMUSCULAR | Status: AC
  Administered 2023-02-25: 50 ug via INTRAVENOUS
  Filled 2023-02-25: qty 1

## 2023-02-25 MED ORDER — PROPOFOL 10 MG/ML IV BOLUS
INTRAVENOUS | Status: AC
  Filled 2023-02-25: qty 20

## 2023-02-25 MED ORDER — OXYCODONE HCL 5 MG PO TABS: 5.0000 mg | ORAL_TABLET | Freq: Four times a day (QID) | ORAL | 0 refills | Status: DC | PRN

## 2023-02-25 MED ORDER — LACTATED RINGERS IV SOLN: INTRAVENOUS | Status: DC | PRN

## 2023-02-25 MED ORDER — 0.9 % SODIUM CHLORIDE (POUR BTL) OPTIME
TOPICAL | Status: DC | PRN
  Administered 2023-02-25: 1000 mL

## 2023-02-25 MED ORDER — BUPIVACAINE HCL (PF) 0.5 % IJ SOLN
INTRAMUSCULAR | Status: DC | PRN
  Administered 2023-02-25: 7 mL

## 2023-02-25 MED ORDER — AMISULPRIDE (ANTIEMETIC) 5 MG/2ML IV SOLN: 10.0000 mg | Freq: Once | INTRAVENOUS | Status: DC | PRN

## 2023-02-25 MED ORDER — MIDAZOLAM HCL 2 MG/2ML IJ SOLN
INTRAMUSCULAR | Status: AC
  Filled 2023-02-25: qty 2

## 2023-02-25 MED ORDER — PROMETHAZINE HCL 25 MG/ML IJ SOLN: 6.2500 mg | INTRAMUSCULAR | Status: DC | PRN

## 2023-02-25 MED ORDER — ACETAMINOPHEN 10 MG/ML IV SOLN
INTRAVENOUS | Status: AC
  Filled 2023-02-25: qty 100

## 2023-02-25 SURGICAL SUPPLY — 46 items
ADH SKN CLS APL DERMABOND .7 (GAUZE/BANDAGES/DRESSINGS) ×1
APL PRP STRL LF DISP 70% ISPRP (MISCELLANEOUS) ×1
BAG COUNTER SPONGE SURGICOUNT (BAG) IMPLANT
BAG SPEC RTRVL 10 TROC 200 (ENDOMECHANICALS) ×1
BAG SPNG CNTER NS LX DISP (BAG)
CABLE HIGH FREQUENCY MONO STRZ (ELECTRODE) ×1 IMPLANT
CHLORAPREP W/TINT 26 (MISCELLANEOUS) ×1 IMPLANT
COVER SURGICAL LIGHT HANDLE (MISCELLANEOUS) ×1 IMPLANT
CUTTER FLEX LINEAR 45M (STAPLE) ×1 IMPLANT
DERMABOND ADVANCED .7 DNX12 (GAUZE/BANDAGES/DRESSINGS) ×1 IMPLANT
ELECT REM PT RETURN 15FT ADLT (MISCELLANEOUS) ×1 IMPLANT
GAUZE 4X4 16PLY ~~LOC~~+RFID DBL (SPONGE) ×1 IMPLANT
GLOVE BIO SURGEON STRL SZ7 (GLOVE) ×2 IMPLANT
GLOVE BIO SURGEON STRL SZ8 (GLOVE) IMPLANT
GLOVE BIOGEL PI IND STRL 7.0 (GLOVE) ×1 IMPLANT
GLOVE BIOGEL PI IND STRL 7.5 (GLOVE) ×2 IMPLANT
GLOVE BIOGEL PI IND STRL 8 (GLOVE) IMPLANT
GLOVE ECLIPSE 8.0 STRL XLNG CF (GLOVE) IMPLANT
GOWN STRL REUS W/ TWL LRG LVL3 (GOWN DISPOSABLE) ×3 IMPLANT
GOWN STRL REUS W/TWL LRG LVL3 (GOWN DISPOSABLE) ×3
GRASPER SUT TROCAR 14GX15 (MISCELLANEOUS) ×1 IMPLANT
IRRIG SUCT STRYKERFLOW 2 WTIP (MISCELLANEOUS) ×1
IRRIGATION SUCT STRKRFLW 2 WTP (MISCELLANEOUS) ×1 IMPLANT
KIT BASIN OR (CUSTOM PROCEDURE TRAY) ×1 IMPLANT
KIT TURNOVER KIT A (KITS) IMPLANT
NS IRRIG 1000ML POUR BTL (IV SOLUTION) ×1 IMPLANT
POUCH RETRIEVAL ECOSAC 10 (ENDOMECHANICALS) ×1 IMPLANT
RELOAD 45 VASCULAR/THIN (ENDOMECHANICALS) IMPLANT
RELOAD STAPLE 45 2.5 WHT GRN (ENDOMECHANICALS) IMPLANT
RELOAD STAPLE 45 3.5 BLU ETS (ENDOMECHANICALS) IMPLANT
RELOAD STAPLE TA45 3.5 REG BLU (ENDOMECHANICALS) ×1 IMPLANT
SCISSORS LAP 5X45 EPIX DISP (ENDOMECHANICALS) IMPLANT
SET TUBE SMOKE EVAC HIGH FLOW (TUBING) ×1 IMPLANT
SHEARS HARMONIC ACE PLUS 36CM (ENDOMECHANICALS) ×1 IMPLANT
SLEEVE Z-THREAD 5X100MM (TROCAR) ×1 IMPLANT
SPIKE FLUID TRANSFER (MISCELLANEOUS) ×1 IMPLANT
STRIP CLOSURE SKIN 1/2X4 (GAUZE/BANDAGES/DRESSINGS) ×1 IMPLANT
SUT MNCRL AB 4-0 PS2 18 (SUTURE) ×1 IMPLANT
SUT VIC AB 1 CT1 27 (SUTURE) ×1
SUT VIC AB 1 CT1 27XBRD ANTBC (SUTURE) IMPLANT
SUT VICRYL 0 UR6 27IN ABS (SUTURE) ×1 IMPLANT
TOWEL OR 17X26 10 PK STRL BLUE (TOWEL DISPOSABLE) ×1 IMPLANT
TRAY FOLEY MTR SLVR 16FR STAT (SET/KITS/TRAYS/PACK) ×1 IMPLANT
TRAY LAPAROSCOPIC (CUSTOM PROCEDURE TRAY) ×1 IMPLANT
TROCAR BALLN 12MMX100 BLUNT (TROCAR) ×1 IMPLANT
TROCAR Z-THREAD FIOS 5X100MM (TROCAR) ×1 IMPLANT

## 2023-02-25 NOTE — H&P (Signed)
Christina [name] is an 42 y.o. female.   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 More general but now rlq.  No fever. Had n/v leading her to call ems. No sob.  Presented to ER and underwent evaluation that shows wbc of 14.2.  she had a ct scan that shows what appears to be uncomplicated appendicitis. I was asked to see her Had egd recently that had some gastritis noted.   Past Medical History:  Diagnosis Date   Abnormal Pap smear    BV (bacterial vaginosis)    GERD (gastroesophageal reflux disease)    Migraine     Past Surgical History:  Procedure Laterality Date   CESAREAN SECTION     DENTAL SURGERY      Family History  Problem Relation Age of Onset   Arrhythmia Mother    Hypertension Mother    Stroke Mother    Heart attack Mother    Cancer Father        brain and lung   Hypertension Father    Migraines Father    Colon cancer Neg Hx    Stomach cancer Neg Hx    Social History:  reports that she has never smoked. She has never used smokeless tobacco. She reports that she does not drink alcohol and does not use drugs.  Allergies: No Known Allergies  No current facility-administered medications on file prior to encounter.   Current Outpatient Medications on File Prior to Encounter  Medication Sig Dispense Refill   ALPRAZolam (XANAX) 1 MG tablet Take 1 tablet (1 mg total) by mouth in the morning. 30 tablet 0   alprazolam (XANAX) 2 MG tablet Take 1 tablet (2 mg total) by mouth nightly at bedtime 30 tablet 3   alprazolam (XANAX) 2 MG tablet Take 1 tablet (2 mg total) by mouth at bedtime. 30 tablet 0   amitriptyline (ELAVIL) 75 MG tablet Take 1 tablet (75 mg total) by mouth at bedtime. 14 tablet 0   amitriptyline (ELAVIL) 75 MG tablet Take 1 tablet (75 mg total) by mouth daily. 90 tablet 1   amoxicillin-clavulanate (AUGMENTIN) 875-125 MG tablet Take 1 tablet by mouth every 12 (twelve) hours as directed for 7 days 14 tablet 0    clindamycin (CLEOCIN T) 1 % external solution Apply topically 2 (two) times daily. 180 mL 5   clindamycin (CLINDAGEL) 1 % gel Apply topically 2 times daily. 30 g 4   Clindamycin Phosphate (XACIATO) 2 % GEL Insert 1 applicatorful vaginally once a week for 12 weeks 60 g 3   clotrimazole (MYCELEX) 10 MG troche Take 1 tablet (10 mg total) by mouth 3 (three) times daily as directed for 10 days. 30 Troche 0   dexlansoprazole (DEXILANT) 60 MG capsule Take 1 capsule (60 mg total) by mouth daily as directed 90 capsule 5   dexlansoprazole (DEXILANT) 60 MG capsule Take 1 capsule (60 mg total) by mouth daily as directed 90 capsule 5   famotidine (PEPCID) 40 MG tablet Take 1 tablet (40 mg total) by mouth 2 (two) times daily. 60 tablet 3   fluconazole (DIFLUCAN) 150 MG tablet Take 1 tablet (150 mg total) by mouth then repeat in 3 days as directed. 2 tablet 0   fluconazole (DIFLUCAN) 150 MG tablet Take 1 tablet (150 mg total) by mouth daily for 2 days 2 tablet 0   fluconazole (DIFLUCAN) 200 MG tablet Take 2 tablets daily for 1 dose,  followed by 1 tablet daily for 13 days. 15 tablet 0   gabapentin (NEURONTIN) 300 MG capsule Take 1 capsule (300 mg total) by mouth 2 (two) times daily. 30 capsule 0   hydrocortisone (ANUSOL-HC) 2.5 % rectal cream Apply 1 application topically to the affected area(s) daily. 30 g 1   hydrocortisone (PROCTOZONE-HC) 2.5 % rectal cream Apply a thin layer to the affected areas twice a day to four times a day 90 g 3   lidocaine (LIDODERM) 5 % Place 1 patch onto the skin daily. Remove & Discard patch within 12 hours or as directed by MD 30 patch 0   lidocaine (LIDODERM) 5 % Place 1 patch onto the skin daily. May wear up to 12 hours. 30 patch 3   lidocaine (XYLOCAINE) 5 % ointment Apply to affected area 3 times a day as needed for 14 days 50 g 1   linaclotide (LINZESS) 145 MCG CAPS capsule Take 1 capsule (145 mcg total) by mouth daily for constipation as directed 90 capsule 1   linaclotide  (LINZESS) 72 MCG capsule Take 1 capsule (72 mcg total) by mouth daily as directed. 90 capsule 2   medroxyPROGESTERone (PROVERA) 10 MG tablet Take 1 tablet (10 mg total) by mouth with food at bedtime. 10 tablet 0   methylPREDNISolone (MEDROL DOSEPAK) 4 MG TBPK tablet Take as directed per package directions 21 tablet 0   metoprolol tartrate (LOPRESSOR) 50 MG tablet Take 1 tablet (50 mg total) by mouth 2 (two) times daily as directed 60 tablet 5   metroNIDAZOLE (METROGEL) 0.75 % vaginal gel Place 1 Applicatorful vaginally every week for 90 days 350 g 3   nitroGLYCERIN (NITROSTAT) 0.4 MG SL tablet Place 1 tablet (0.4 mg total) under the tongue as needed. 25 tablet 1   nystatin (MYCOSTATIN) 100000 UNIT/ML suspension swish and spit 5 mLs (500,000 Units total) by mouth 3 (three) times daily. 210 mL 3   potassium bicarbonate (KLOR-CON/EF) 25 MEQ disintegrating tablet Dissolve 1 tablet in water and drink 2 times a day with meals 60 tablet 0   propranolol (INDERAL) 10 MG tablet Take 1 tablet (10 mg total) by mouth 3 (three) times daily as needed. 90 tablet 3   sucralfate (CARAFATE) 1 g tablet Take 1 tablet (1 g total) by mouth 3 (three) times daily before meals. 90 tablet 1   valACYclovir (VALTREX) 1000 MG tablet Take 1 tablet (1,000 mg total) by mouth every morning. 90 tablet 1   valACYclovir (VALTREX) 1000 MG tablet Take 1 tablet (1,000 mg total) by mouth 2 (two) times daily for outbreaks then use daily 60 tablet 11   valACYclovir (VALTREX) 1000 MG tablet Take 1 tablet (1,000 mg total) by mouth in the morning. 90 tablet 1      Results for orders placed or performed during the hospital encounter of 02/25/23 (from the past 48 hour(s))  Lipase, blood     Status: None   Collection Time: 02/25/23  5:08 AM  Result Value Ref Range   Lipase 26 11 - 51 U/L    Comment: Performed at [facility], [address], [place], [place] [address]  Comprehensive metabolic panel     Status: Abnormal    Collection Time: 02/25/23  5:08 AM  Result Value Ref Range   Sodium 134 (L) 135 - 145 mmol/L   Potassium 3.6 3.5 - 5.1 mmol/L   Chloride 101 98 - 111 mmol/L   CO2 26 22 - 32 mmol/L   Glucose,  Bld 144 (H) 70 - 99 mg/dL    Comment: Glucose reference range applies only to samples taken after fasting for at least 8 hours.   BUN <5 (L) 6 - 20 mg/dL   Creatinine, Ser 4.09 0.44 - 1.00 mg/dL   Calcium 8.6 (L) 8.9 - 10.3 mg/dL   Total Protein 7.5 6.5 - 8.1 g/dL   Albumin 3.8 3.5 - 5.0 g/dL   AST 15 15 - 41 U/L   ALT 11 0 - 44 U/L   Alkaline Phosphatase 61 38 - 126 U/L   Total Bilirubin 0.7 0.3 - 1.2 mg/dL   GFR, Estimated >81 >19 mL/min    Comment: (NOTE) Calculated using the CKD-EPI Creatinine Equation (2021)    Anion gap 7 5 - 15    Comment: Performed at [facility], [address], [place], [place] [address]  CBC     Status: Abnormal   Collection Time: 02/25/23  5:08 AM  Result Value Ref Range   WBC 14.2 (H) 4.0 - 10.5 K/uL    Comment: WHITE COUNT CONFIRMED ON SMEAR   RBC 3.70 (L) 3.87 - 5.11 MIL/uL   Hemoglobin 11.8 (L) 12.0 - 15.0 g/dL   HCT 95.6 (L) 21.3 - 08.6 %   MCV 97.0 80.0 - 100.0 fL   MCH 31.9 26.0 - 34.0 pg   MCHC 32.9 30.0 - 36.0 g/dL   RDW 57.8 (L) 46.9 - 62.9 %   Platelets 227 150 - 400 K/uL   nRBC 0.0 0.0 - 0.2 %    Comment: Performed at [facility], [address], [place], [place] [address]  I-Stat beta hCG blood, ED     Status: None   Collection Time: 02/25/23  5:15 AM  Result Value Ref Range   I-stat hCG, quantitative <5.0 <5 mIU/mL   Comment 3            Comment:   GEST. AGE      CONC.  (mIU/mL)   <=1 WEEK        5 - 50     2 WEEKS       50 - 500     3 WEEKS       100 - 10,000     4 WEEKS     1,000 - 30,000        FEMALE AND NON-PREGNANT FEMALE:     LESS THAN 5 mIU/mL   Urinalysis, Routine w reflex microscopic -Urine, Clean Catch     Status: Abnormal   Collection Time: 02/25/23  6:47 AM  Result Value Ref Range    Color, Urine STRAW (A) YELLOW   APPearance CLEAR CLEAR   Specific Gravity, Urine 1.014 1.005 - 1.030   pH 8.0 5.0 - 8.0   Glucose, UA NEGATIVE NEGATIVE mg/dL   Hgb urine dipstick NEGATIVE NEGATIVE   Bilirubin Urine NEGATIVE NEGATIVE   Ketones, ur NEGATIVE NEGATIVE mg/dL   Protein, ur NEGATIVE NEGATIVE mg/dL   Nitrite NEGATIVE NEGATIVE   Leukocytes,Ua NEGATIVE NEGATIVE    Comment: Performed at [facility], [address], [place], [place] [address]   CT ABDOMEN PELVIS W CONTRAST  Result Date: 02/25/2023 CLINICAL DATA:  Abdominal pain EXAM: CT ABDOMEN AND PELVIS WITH CONTRAST TECHNIQUE: Multidetector CT imaging of the abdomen and pelvis was performed using the standard protocol following bolus administration of intravenous contrast. RADIATION DOSE REDUCTION: This exam was performed according to the departmental dose-optimization program which includes automated exposure control, adjustment  of the mA and/or kV according to patient size and/or use of iterative reconstruction technique. CONTRAST:  80mL OMNIPAQUE IOHEXOL 300 MG/ML  SOLN COMPARISON:  None Available. FINDINGS: Lower chest: Mild dependent changes in the posterior lung bases. Hepatobiliary: There is no focal liver abnormality. Gallbladder appears normal. No bile duct dilatation. Pancreas: Unremarkable. No pancreatic ductal dilatation or surrounding inflammatory changes. Spleen: Normal in size without focal abnormality. Adrenals/Urinary Tract: Normal adrenal glands. No kidney stones, mass or hydronephrosis. Urinary bladder appears normal. Stomach/Bowel: The stomach appears within normal limits. The appendix appears diffusely thickened with wall edema measuring 1.7 cm in thickness. The wall thickness measures up to 7 mm. Mild periappendiceal haziness is identified. No sign of perforation. There is no pathologic dilatation of the large or small bowel loops. No bowel wall thickening or inflammation. Vascular/Lymphatic: No  significant vascular findings are present. No enlarged abdominal or pelvic lymph nodes. Reproductive: Uterus and bilateral adnexa are unremarkable. Other: Small volume of free fluid noted within the dependent pelvis. No discrete fluid collections. Musculoskeletal: No acute or significant osseous findings. IMPRESSION: 1. Examination is positive for acute appendicitis. No sign of perforation or abscess. 2. Small volume of free fluid noted within the dependent pelvis. Electronically Signed   By: Signa Kell M.D.   On: 02/25/2023 08:11    Review of Systems  Constitutional:  Negative for fever.  Gastrointestinal:  Positive for abdominal pain, nausea and vomiting.  All other systems reviewed and are negative.   Blood pressure 91/77, pulse 74, temperature 98.4 F (36.9 C), temperature source Oral, resp. rate 17, height 5\' 2"  (1.575 m), weight 54.4 kg, last menstrual period 02/18/2023, SpO2 98 %. Physical Exam Vitals reviewed.  Constitutional:      Appearance: She is well-developed.  Eyes:     General: No scleral icterus. Cardiovascular:     Rate and Rhythm: Normal rate.  Pulmonary:     Effort: Pulmonary effort is normal.  Abdominal:     General: There is no distension.     Palpations: Abdomen is soft.     Tenderness: There is abdominal tenderness in the right lower quadrant.     Hernia: No hernia is present.  Musculoskeletal:     Right lower leg: No edema.     Left lower leg: No edema.  Skin:    General: Skin is warm.     Capillary Refill: Capillary refill takes less than 2 seconds.  Neurological:     General: No focal deficit present.     Mental Status: She is alert.  Psychiatric:        Mood and Affect: Mood is anxious.      Assessment/Plan Appendicitis -discussed surgical vs nonoperative therapy -recommended lap appy- discussed risks and recovery with her, plan to proceed today -hopefully dc home after case -will send appendix for pathology and consider csc later  Emelia Loron, MD 02/25/2023, 10:20 AM

## 2023-02-25 NOTE — Transfer of Care (Signed)
Immediate Anesthesia Transfer of Care Note  Patient: Christina [name]  Procedure(s) Performed: Procedure(s): APPENDECTOMY LAPAROSCOPIC (N/A)  Patient Location: PACU  Anesthesia Type:General  Level of Consciousness: Alert, Awake, Oriented  Airway & Oxygen Therapy: Patient Spontanous Breathing  Post-op Assessment: Report given to RN  Post vital signs: Reviewed and stable  Last Vitals:  Vitals:   02/25/23 1030 02/25/23 1200  BP: 99/68 101/68  Pulse: 82 68  Resp: 16 18  Temp:  37 C  SpO2: 100% 100%    Complications: No apparent anesthesia complications

## 2023-02-25 NOTE — ED Notes (Addendum)
Patient transported to CT 

## 2023-02-25 NOTE — ED Triage Notes (Signed)
Patient arrives with complaints of abdominal pain that began after work yesterday. Patient started constant sharp pain in the lower abdomen. Endorses N/V. Takes metoprolol for elevated HR.

## 2023-02-25 NOTE — Anesthesia Preprocedure Evaluation (Addendum)
Anesthesia Evaluation  Patient identified by MRN, date of birth, ID band Patient awake    Reviewed: Allergy & Precautions, NPO status , Patient's Chart, lab work & pertinent test results  Airway Mallampati: II  TM Distance: >3 FB Neck ROM: Full    Dental no notable dental hx. (+) Edentulous Lower, Edentulous Upper   Pulmonary neg pulmonary ROS   Pulmonary exam normal        Cardiovascular hypertension, Pt. on home beta blockers Normal cardiovascular exam Rhythm:Regular     Neuro/Psych  Headaches PSYCHIATRIC DISORDERS         GI/Hepatic negative GI ROS, Neg liver ROS,,,  Endo/Other  negative endocrine ROS    Renal/GU negative Renal ROS     Musculoskeletal negative musculoskeletal ROS (+)    Abdominal   Peds  Hematology  (+) Blood dyscrasia, anemia   Anesthesia Other Findings ACUTE APPENDICITIS  Reproductive/Obstetrics Hcg negative                             Anesthesia Physical Anesthesia Plan  ASA: 2  Anesthesia Plan: General   Post-op Pain Management:    Induction: Intravenous  PONV Risk Score and Plan: 4 or greater and Ondansetron, Dexamethasone, Midazolam and Treatment may vary due to age or medical condition  Airway Management Planned: Oral ETT  Additional Equipment:   Intra-op Plan:   Post-operative Plan: Extubation in OR  Informed Consent: I have reviewed the patients History and Physical, chart, labs and discussed the procedure including the risks, benefits and alternatives for the proposed anesthesia with the patient or authorized representative who has indicated his/her understanding and acceptance.     Dental advisory given  Plan Discussed with: CRNA  Anesthesia Plan Comments: (Multiple piercings )        Anesthesia Quick Evaluation

## 2023-02-25 NOTE — ED Provider Notes (Signed)
[place] EMERGENCY DEPARTMENT AT [facility] Provider Note   CSN: [id number] Arrival date & time: 02/25/23  0448     History  Chief Complaint  Patient presents with   Abdominal Pain    Christina [name] is a 42 y.o. female.  The history is provided by the patient.  Abdominal Pain Christina [name] is a 42 y.o. female who presents to the Emergency Department complaining of abdominal pain.  She developed generalized abdominal pain around 7 PM with associated numerous episodes of nonbloody emesis.  No associated dysuria, fever, shortness of breath.  She has a history of hypertension and anxiety.  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     Home Medications Prior to Admission medications   Medication Sig Start Date End Date Taking? Authorizing Provider  ALPRAZolam Prudy Feeler) 1 MG tablet Take 1 tablet (1 mg total) by mouth in the morning. 02/12/23     alprazolam (XANAX) 2 MG tablet Take 1 tablet (2 mg total) by mouth nightly at bedtime 07/11/22     alprazolam (XANAX) 2 MG tablet Take 1 tablet (2 mg total) by mouth at bedtime. 02/12/23     amitriptyline (ELAVIL) 75 MG tablet Take 1 tablet (75 mg total) by mouth at bedtime. 07/09/17   Junie Spencer, FNP  amitriptyline (ELAVIL) 75 MG tablet Take 1 tablet (75 mg total) by mouth daily. 01/08/23     amoxicillin-clavulanate (AUGMENTIN) 875-125 MG tablet Take 1 tablet by mouth every 12 (twelve) hours as directed for 7 days 01/15/23     clindamycin (CLEOCIN T) 1 % external solution Apply topically 2 (two) times daily. 09/27/22     clindamycin (CLINDAGEL) 1 % gel Apply topically 2 times daily. 01/20/22     Clindamycin Phosphate (XACIATO) 2 % GEL Insert 1 applicatorful vaginally once a week for 12 weeks 10/19/22     clotrimazole (MYCELEX) 10 MG troche Take 1 tablet (10 mg total) by mouth 3 (three) times daily as directed for 10 days. 01/08/23     dexlansoprazole (DEXILANT) 60 MG capsule Take 1 capsule (60 mg total)  by mouth daily as directed 09/08/22     dexlansoprazole (DEXILANT) 60 MG capsule Take 1 capsule (60 mg total) by mouth daily as directed 09/27/22     famotidine (PEPCID) 40 MG tablet Take 1 tablet (40 mg total) by mouth 2 (two) times daily. 10/17/22   Unk Lightning, PA  fluconazole (DIFLUCAN) 150 MG tablet Take 1 tablet (150 mg total) by mouth then repeat in 3 days as directed. 12/27/22     fluconazole (DIFLUCAN) 150 MG tablet Take 1 tablet (150 mg total) by mouth daily for 2 days 01/08/23     fluconazole (DIFLUCAN) 200 MG tablet Take 2 tablets daily for 1 dose, followed by 1 tablet daily for 13 days. 10/27/22   Imogene Burn, MD  gabapentin (NEURONTIN) 300 MG capsule Take 1 capsule (300 mg total) by mouth 2 (two) times daily. 02/03/23   Garlon Hatchet, PA-C  hydrocortisone (ANUSOL-HC) 2.5 % rectal cream Apply 1 application topically to the affected area(s) daily. 02/15/21     hydrocortisone (PROCTOZONE-HC) 2.5 % rectal cream Apply a thin layer to the affected areas twice a day to four times a day 09/27/22     lidocaine (LIDODERM) 5 % Place 1 patch onto the skin daily. Remove & Discard patch within 12 hours or as directed by MD 03/13/22   Kommor, [place],  MD  lidocaine (LIDODERM) 5 % Place 1 patch onto the skin daily. May wear up to 12 hours. 12/11/22     lidocaine (XYLOCAINE) 5 % ointment Apply to affected area 3 times a day as needed for 14 days 12/07/22     linaclotide (LINZESS) 145 MCG CAPS capsule Take 1 capsule (145 mcg total) by mouth daily for constipation as directed 09/27/22     linaclotide (LINZESS) 72 MCG capsule Take 1 capsule (72 mcg total) by mouth daily as directed. 07/11/22     medroxyPROGESTERone (PROVERA) 10 MG tablet Take 1 tablet (10 mg total) by mouth with food at bedtime. 12/22/22     methylPREDNISolone (MEDROL DOSEPAK) 4 MG TBPK tablet Take as directed per package directions 01/08/23     metoprolol tartrate (LOPRESSOR) 50 MG tablet Take 1 tablet (50 mg total) by mouth 2 (two) times  daily as directed 01/16/22     metroNIDAZOLE (METROGEL) 0.75 % vaginal gel Place 1 Applicatorful vaginally every week for 90 days 09/27/22     nitroGLYCERIN (NITROSTAT) 0.4 MG SL tablet Place 1 tablet (0.4 mg total) under the tongue as needed. 09/12/22     nystatin (MYCOSTATIN) 100000 UNIT/ML suspension swish and spit 5 mLs (500,000 Units total) by mouth 3 (three) times daily. 09/08/22     potassium bicarbonate (KLOR-CON/EF) 25 MEQ disintegrating tablet Dissolve 1 tablet in water and drink 2 times a day with meals 11/08/22     propranolol (INDERAL) 10 MG tablet Take 1 tablet (10 mg total) by mouth 3 (three) times daily as needed. 09/26/22   Custovic, Rozell Searing, DO  sucralfate (CARAFATE) 1 g tablet Take 1 tablet (1 g total) by mouth 3 (three) times daily before meals. 07/11/22     valACYclovir (VALTREX) 1000 MG tablet Take 1 tablet (1,000 mg total) by mouth every morning. 10/31/22     valACYclovir (VALTREX) 1000 MG tablet Take 1 tablet (1,000 mg total) by mouth 2 (two) times daily for outbreaks then use daily 12/07/22     valACYclovir (VALTREX) 1000 MG tablet Take 1 tablet (1,000 mg total) by mouth in the morning. 12/07/22         Allergies    Patient has no known allergies.    Review of Systems   Review of Systems  Gastrointestinal:  Positive for abdominal pain.  All other systems reviewed and are negative.   Physical Exam Updated Vital Signs BP 109/79 (BP Location: Right Arm)   Pulse (!) 59   Temp 98.3 F (36.8 C) (Oral)   Resp 18   Ht 5\' 2"  (1.575 m)   Wt 54.4 kg   LMP 02/18/2023   SpO2 100%   BMI 21.95 kg/m  Physical Exam Vitals and nursing note reviewed.  Constitutional:      Appearance: She is well-developed.  HENT:     Head: Normocephalic and atraumatic.  Cardiovascular:     Rate and Rhythm: Normal rate and regular rhythm.  Pulmonary:     Effort: Pulmonary effort is normal. No respiratory distress.  Abdominal:     Palpations: Abdomen is soft.     Tenderness: There is no  guarding or rebound.     Comments: Moderate lower abdominal tenderness  Musculoskeletal:        General: No tenderness.  Skin:    General: Skin is warm and dry.  Neurological:     Mental Status: She is alert and oriented to person, place, and time.  Psychiatric:        Behavior:  Behavior normal.     ED Results / Procedures / Treatments   Labs (all labs ordered are listed, but only abnormal results are displayed) Labs Reviewed  COMPREHENSIVE METABOLIC PANEL - Abnormal; Notable for the following components:      Result Value   Sodium 134 (*)    Glucose, Bld 144 (*)    BUN <5 (*)    Calcium 8.6 (*)    All other components within normal limits  CBC - Abnormal; Notable for the following components:   WBC 14.2 (*)    RBC 3.70 (*)    Hemoglobin 11.8 (*)    HCT 35.9 (*)    RDW 11.3 (*)    All other components within normal limits  LIPASE, BLOOD  URINALYSIS, ROUTINE W REFLEX MICROSCOPIC  I-STAT BETA HCG BLOOD, ED ([facility], [facility], [facility] ONLY)    EKG None  Radiology No results found.  Procedures Procedures    Medications Ordered in ED Medications  ondansetron (ZOFRAN) injection 4 mg (4 mg Intravenous Given 02/25/23 0506)    ED Course/ Medical Decision Making/ A&P                             Medical Decision Making Amount and/or Complexity of Data Reviewed Labs: ordered. Radiology: ordered.  Risk Prescription drug management.   Patient with history of hypertension here for evaluation of abdominal pain, vomiting.  She did have a sick contact.  She has moderate lower abdominal tenderness on examination.  Labs with mild leukocytosis.  Plan to obtain CT abdomen pelvis to further evaluate for appendicitis or additional process.  Patient care transferred pending CT results and reevaluation.        Final Clinical Impression(s) / ED Diagnoses Final diagnoses:  None    Rx / DC Orders ED Discharge Orders     None         Tilden Fossa, MD 02/25/23 [phone]

## 2023-02-25 NOTE — Anesthesia Postprocedure Evaluation (Signed)
Anesthesia Post Note  Patient: Christina [name]  Procedure(s) Performed: APPENDECTOMY LAPAROSCOPIC (Abdomen)     Patient location during evaluation: PACU Anesthesia Type: General Level of consciousness: awake Pain management: pain level controlled Vital Signs Assessment: post-procedure vital signs reviewed and stable Respiratory status: spontaneous breathing, nonlabored ventilation and respiratory function stable Cardiovascular status: blood pressure returned to baseline and stable Postop Assessment: no apparent nausea or vomiting Anesthetic complications: no   No notable events documented.  Last Vitals:  Vitals:   02/25/23 1353 02/25/23 1406  BP:  106/62  Pulse: (!) 57 (!) 58  Resp: 18   Temp:    SpO2: 93% 94%    Last Pain:  Vitals:   02/25/23 1343  TempSrc:   PainSc: 3                  Aston Lawhorn P Aaliyan Brinkmeier

## 2023-02-25 NOTE — Discharge Instructions (Signed)
[facility] LAPAROSCOPIC SURGERY: POST OP INSTRUCTIONS  Always review your discharge instruction sheet given to you by the facility where your surgery was performed. IF YOU HAVE DISABILITY OR FAMILY LEAVE FORMS, YOU MUST BRING THEM TO THE OFFICE FOR PROCESSING.   DO NOT GIVE THEM TO YOUR DOCTOR.  A prescription for pain medication may be given to you upon discharge.  Take your pain medication as prescribed, if needed.  If narcotic pain medicine is not needed, then you may take acetaminophen (Tylenol), naprosyn (Alleve), or ibuprofen (Advil) as needed. Take your usually prescribed medications unless otherwise directed. If you need a refill on your pain medication, please contact your pharmacy.  They will contact our office to request authorization. Prescriptions will not be filled after 5pm or on week-ends. You should follow a light diet the first few days after arrival home, such as soup and crackers, etc.  Be sure to include lots of fluids daily. Most patients will experience some swelling and bruising in the area of the incisions.  Ice packs will help.  Swelling and bruising can take several days to resolve.  It is common to experience some constipation if taking pain medication after surgery.  Increasing fluid intake and taking a stool softener (such as Colace) will usually help or prevent this problem from occurring.  A mild laxative (Milk of Magnesia or Miralax) should be taken according to package instructions if there are no bowel movements after 48 hours. Unless discharge instructions indicate otherwise, you may remove your bandages 48 hours after surgery, and you may shower at that time.  You may have steri-strips (small skin tapes) in place directly over the incision.  These strips should be left on the skin for 7-10 days.  If your surgeon used skin glue on the incision, you may shower in 24 hours.  The glue will flake off over the next  2-3 weeks.  Any sutures or staples will be removed at the office during your follow-up visit. ACTIVITIES:  You may resume regular (light) daily activities beginning the next day--such as daily self-care, walking, climbing stairs--gradually increasing activities as tolerated.  You may have sexual intercourse when it is comfortable.  Refrain from any heavy lifting or straining until approved by your doctor. You may drive when you are no longer taking prescription pain medication, you can comfortably wear a seatbelt, and you can safely maneuver your car and apply brakes. RETURN TO WORK:  __________________________________________________________ You should see your doctor in the office for a follow-up appointment approximately 2-3 weeks after your surgery.  Make sure that you call for this appointment within a day or two after you arrive home to insure a convenient appointment time. OTHER INSTRUCTIONS: __________________________________________________________________________________________________________________________ __________________________________________________________________________________________________________________________ WHEN TO CALL YOUR DOCTOR: Fever over 101.0 Inability to urinate Continued bleeding from incision. Increased pain, redness, or drainage from the incision. Increasing abdominal pain  The clinic staff is available to answer your questions during regular business hours.  Please don't hesitate to call and ask to speak to one of the nurses for clinical concerns.  If you have a medical emergency, go to the nearest emergency room or call 911.  A surgeon from [facility] is always on call at the hospital. [address], [address], [place], [place]  [address] ? [address] [address], [place], [place]   [address] [phone] ? [phone] ? FAX [phone] Web site: [email or web address]  

## 2023-02-25 NOTE — Anesthesia Procedure Notes (Signed)
Procedure Name: Intubation Date/Time: 02/25/2023 12:37 PM  Performed by: Basilio Cairo, CRNAPre-anesthesia Checklist: Patient identified, Patient being monitored, Timeout performed, Emergency Drugs available and Suction available Patient Re-evaluated:Patient Re-evaluated prior to induction Oxygen Delivery Method: Circle system utilized Preoxygenation: Pre-oxygenation with 100% oxygen Induction Type: IV induction Ventilation: Mask ventilation without difficulty Laryngoscope Size: Mac and 3 Grade View: Grade I Tube type: Oral Tube size: 7.0 mm Number of attempts: 1 Airway Equipment and Method: Stylet Placement Confirmation: ETT inserted through vocal cords under direct vision, positive ETCO2 and breath sounds checked- equal and bilateral Secured at: 21 cm Tube secured with: Tape Dental Injury: Teeth and Oropharynx as per pre-operative assessment

## 2023-02-25 NOTE — Op Note (Signed)
Preoperative diagnosis: acute appendicitis Postoperative diagnosis: same as above Procedure: Laparoscopic appendectomy Surgeon: Dr. Harden Mo Anesthesia: General Estimated blood loss: Minimal Complications: None Drains: None Specimens: Appendix to pathology Sponge needle count correct completion Disposition recovery stable condition  Indications: This is a 42 year old otherwise fairly healthy female presents with 1 day of abdominal pain.  She has an elevated white blood cell count and a CT scan and exam consistent with acute appendicitis.  Procedure: After informed consent was obtained we went to the operating room.  She was given antibiotics.  SCDs were placed.  She was placed under general anesthesia without complication.  She was prepped and draped in the standard sterile surgical fashion.  Surgical timeout was then performed.  I have treated Marcaine below her umbilicus.  I made a vertical incision.  I grasped the fascia and incised this sharply.  I entered the peritoneum bluntly without injury.  I then placed a 0 Vicryl pursestring suture to the fascia.  I inserted a Hassan trocar and insufflated the abdomen to 15 mmHg pressure.  I inserted 2 additional 5 mm trocars in the suprapubic and left lower quadrant region of the abdomen.  These were done under direct vision without complication.  Her appendix was noted to be dilated from the base throughout.  It was not perforated.  I used the harmonic scalpel to take down the appendiceal mesentery.  I did this all the way to the base.  I noted the terminal ileum going into the cecum.  I then used a GIA stapler to divide really the very lower portion of the cecum to include the appendix obviously as well.  I then remove the appendix via retrieval bag.  Hemostasis was observed.  I then removed the [facility] trocar.  At time I pursestring down.  I placed an additional 0 Vicryl suture with the suture passer to completely obliterate the umbilical defect.   The remaining trocars were removed.  The abdomen was completely desufflated.  I then closed these with 4-0 Monocryl and glue.  She tolerated this well was extubated transferred recovery stable.  She is okay to be discharged home today.

## 2023-02-25 NOTE — ED Provider Notes (Signed)
  Provider Note MRN:  [id number]  Arrival date & time: 02/25/23    ED Course and Medical Decision Making  Assumed care from Dr Madilyn Hook at shift change.  See note from prior team for complete details, in brief:   N/V w/ LQ abd pain x24 hours Last PO yestd am No fevers, no suspicious p.o. or recent travel/sick contacts Ct pending   Plan per prior physician follow-up CT, recheck  CT concerning for acute appendicitis, no clear perforation.  Recheck of abdomen she is nonperitoneal, continues to have pain right lower quadrant. Discussed with patient and regarding imaging findings.  Will discuss with general surgery for definitive management  Keep npo  Spoke w/ Dr Dwain Sarna   Procedures  Final Clinical Impressions(s) / ED Diagnoses     ICD-10-CM   1. Acute appendicitis, unspecified acute appendicitis type  K35.80       ED Discharge Orders     None       Discharge Instructions   None        Sloan Leiter, DO 02/25/23 [phone]

## 2023-02-26 ENCOUNTER — Encounter (HOSPITAL_COMMUNITY): Payer: Self-pay | Admitting: General Surgery

## 2023-02-27 LAB — SURGICAL PATHOLOGY

## 2023-03-06 ENCOUNTER — Other Ambulatory Visit (HOSPITAL_COMMUNITY): Payer: Self-pay

## 2023-03-06 MED ORDER — GABAPENTIN 300 MG PO CAPS
300.0000 mg | ORAL_CAPSULE | Freq: Two times a day (BID) | ORAL | 1 refills | Status: DC
  Filled 2023-03-06: qty 60, 30d supply, fill #0
  Filled 2023-08-21: qty 60, 30d supply, fill #1

## 2023-03-16 ENCOUNTER — Other Ambulatory Visit (HOSPITAL_COMMUNITY): Payer: Self-pay

## 2023-03-16 ENCOUNTER — Other Ambulatory Visit: Payer: Self-pay

## 2023-03-16 MED ORDER — FLUCONAZOLE 150 MG PO TABS
150.0000 mg | ORAL_TABLET | Freq: Every day | ORAL | 0 refills | Status: AC
  Filled 2023-03-16: qty 2, 2d supply, fill #0

## 2023-03-16 MED ORDER — PROPRANOLOL HCL 10 MG PO TABS
10.0000 mg | ORAL_TABLET | Freq: Three times a day (TID) | ORAL | 1 refills | Status: DC
  Filled 2023-03-16: qty 270, 90d supply, fill #0
  Filled 2023-03-16 – 2023-07-12 (×2): qty 90, 30d supply, fill #0
  Filled 2023-08-10 – 2023-08-21 (×2): qty 90, 30d supply, fill #1
  Filled 2023-09-17: qty 90, 30d supply, fill #2

## 2023-03-16 MED ORDER — DEXLANSOPRAZOLE 60 MG PO CPDR
60.0000 mg | DELAYED_RELEASE_CAPSULE | Freq: Every day | ORAL | 5 refills | Status: DC
  Filled 2023-12-20: qty 90, 90d supply, fill #0
  Filled 2023-12-20: qty 30, 30d supply, fill #0
  Filled 2024-02-08 – 2024-02-19 (×2): qty 30, 30d supply, fill #1
  Filled 2024-03-03 – 2024-03-15 (×2): qty 30, 30d supply, fill #2

## 2023-03-16 MED ORDER — AMITRIPTYLINE HCL 75 MG PO TABS
75.0000 mg | ORAL_TABLET | Freq: Every day | ORAL | 1 refills | Status: DC
  Filled 2023-03-16 – 2023-04-18 (×3): qty 90, 90d supply, fill #0
  Filled 2023-07-12: qty 30, 30d supply, fill #1
  Filled 2023-09-17 – 2023-09-24 (×3): qty 30, 30d supply, fill #2
  Filled 2024-01-16: qty 30, 30d supply, fill #3

## 2023-03-16 MED ORDER — ALPRAZOLAM 1 MG PO TABS
1.0000 mg | ORAL_TABLET | Freq: Every morning | ORAL | 0 refills | Status: DC
  Filled 2023-03-16: qty 30, 30d supply, fill #0

## 2023-03-16 MED ORDER — LINZESS 72 MCG PO CAPS
72.0000 ug | ORAL_CAPSULE | Freq: Every day | ORAL | 2 refills | Status: DC
  Filled 2023-07-12: qty 90, 90d supply, fill #0
  Filled 2023-07-12 – 2023-07-14 (×2): qty 30, 30d supply, fill #0
  Filled 2023-08-21: qty 30, 30d supply, fill #1
  Filled 2023-09-17: qty 30, 30d supply, fill #2
  Filled 2024-01-16 – 2024-02-14 (×4): qty 30, 30d supply, fill #3

## 2023-03-16 MED ORDER — CLINDAMYCIN PHOSPHATE 1 % EX GEL
CUTANEOUS | 4 refills | Status: DC
  Filled 2023-03-16: qty 30, 30d supply, fill #0
  Filled 2023-07-12: qty 30, 15d supply, fill #0

## 2023-03-16 MED ORDER — ALPRAZOLAM 2 MG PO TABS
2.0000 mg | ORAL_TABLET | Freq: Every day | ORAL | 0 refills | Status: DC
  Filled 2023-03-16: qty 23, 23d supply, fill #0
  Filled 2023-03-16: qty 7, 7d supply, fill #0

## 2023-03-16 MED ORDER — GABAPENTIN 300 MG PO CAPS
300.0000 mg | ORAL_CAPSULE | Freq: Two times a day (BID) | ORAL | 1 refills | Status: DC
  Filled 2023-03-16 – 2023-07-12 (×2): qty 60, 30d supply, fill #0
  Filled 2023-08-09 – 2023-09-17 (×2): qty 60, 30d supply, fill #1

## 2023-03-16 MED ORDER — VITAMIN D (ERGOCALCIFEROL) 1.25 MG (50000 UNIT) PO CAPS
50000.0000 [IU] | ORAL_CAPSULE | ORAL | 3 refills | Status: DC
  Filled 2023-03-16 – 2023-05-12 (×2): qty 5, 35d supply, fill #0
  Filled 2023-07-12: qty 4, 28d supply, fill #1
  Filled 2024-03-15: qty 4, 28d supply, fill #2

## 2023-03-16 MED ORDER — FLUCONAZOLE 200 MG PO TABS
400.0000 mg | ORAL_TABLET | ORAL | 1 refills | Status: DC
  Filled 2023-03-16: qty 15, 14d supply, fill #0
  Filled 2023-05-12: qty 15, 14d supply, fill #1

## 2023-03-16 MED ORDER — METHOCARBAMOL 750 MG PO TABS
750.0000 mg | ORAL_TABLET | Freq: Three times a day (TID) | ORAL | 1 refills | Status: DC | PRN
  Filled 2023-03-16: qty 30, 10d supply, fill #0

## 2023-03-19 ENCOUNTER — Other Ambulatory Visit (HOSPITAL_COMMUNITY): Payer: Self-pay

## 2023-03-19 MED ORDER — NITROGLYCERIN 0.4 MG SL SUBL
0.4000 mg | SUBLINGUAL_TABLET | Freq: Every day | SUBLINGUAL | 1 refills | Status: DC
  Filled 2023-03-19: qty 25, 5d supply, fill #0
  Filled 2023-07-12: qty 50, 30d supply, fill #0
  Filled 2023-07-14: qty 25, 25d supply, fill #0
  Filled 2024-01-16 – 2024-01-30 (×2): qty 25, 25d supply, fill #1

## 2023-03-19 MED ORDER — METOPROLOL TARTRATE 50 MG PO TABS
50.0000 mg | ORAL_TABLET | Freq: Two times a day (BID) | ORAL | 5 refills | Status: AC
  Filled 2023-03-19 – 2023-07-12 (×2): qty 60, 30d supply, fill #0
  Filled 2023-08-10 – 2023-09-17 (×2): qty 60, 30d supply, fill #1

## 2023-03-29 ENCOUNTER — Other Ambulatory Visit (HOSPITAL_COMMUNITY): Payer: Self-pay

## 2023-04-13 ENCOUNTER — Other Ambulatory Visit (HOSPITAL_COMMUNITY): Payer: Self-pay

## 2023-04-13 MED ORDER — ALPRAZOLAM 1 MG PO TABS
1.0000 mg | ORAL_TABLET | Freq: Every morning | ORAL | 2 refills | Status: DC
  Filled 2023-04-13: qty 30, 30d supply, fill #0
  Filled 2023-05-12: qty 30, 30d supply, fill #1
  Filled 2023-06-16 (×2): qty 30, 30d supply, fill #2

## 2023-04-13 MED ORDER — ALPRAZOLAM 2 MG PO TABS
2.0000 mg | ORAL_TABLET | Freq: Every day | ORAL | 2 refills | Status: DC
  Filled 2023-04-13: qty 30, 30d supply, fill #0
  Filled 2023-05-12: qty 30, 30d supply, fill #1
  Filled 2023-06-16 (×2): qty 30, 30d supply, fill #2

## 2023-04-13 MED ORDER — METOPROLOL TARTRATE 50 MG PO TABS
50.0000 mg | ORAL_TABLET | Freq: Two times a day (BID) | ORAL | 5 refills | Status: DC
  Filled 2023-04-13: qty 60, 30d supply, fill #0

## 2023-04-18 ENCOUNTER — Other Ambulatory Visit: Payer: Self-pay

## 2023-04-18 ENCOUNTER — Other Ambulatory Visit (HOSPITAL_COMMUNITY): Payer: Self-pay

## 2023-05-12 ENCOUNTER — Other Ambulatory Visit (HOSPITAL_COMMUNITY): Payer: Self-pay

## 2023-05-17 ENCOUNTER — Other Ambulatory Visit (HOSPITAL_COMMUNITY): Payer: Self-pay

## 2023-06-16 ENCOUNTER — Other Ambulatory Visit: Payer: Self-pay | Admitting: Physician Assistant

## 2023-06-16 ENCOUNTER — Other Ambulatory Visit (HOSPITAL_COMMUNITY): Payer: Self-pay

## 2023-06-18 ENCOUNTER — Other Ambulatory Visit (HOSPITAL_COMMUNITY): Payer: Self-pay

## 2023-06-18 MED ORDER — FAMOTIDINE 40 MG PO TABS
40.0000 mg | ORAL_TABLET | Freq: Two times a day (BID) | ORAL | 3 refills | Status: DC
  Filled 2023-06-18: qty 60, 30d supply, fill #0
  Filled 2023-07-23: qty 60, 30d supply, fill #1
  Filled 2023-08-09 – 2023-12-20 (×3): qty 60, 30d supply, fill #2
  Filled 2024-02-12 – 2024-03-03 (×3): qty 60, 30d supply, fill #3

## 2023-07-12 ENCOUNTER — Other Ambulatory Visit: Payer: Self-pay

## 2023-07-12 ENCOUNTER — Other Ambulatory Visit (HOSPITAL_COMMUNITY): Payer: Self-pay

## 2023-07-13 ENCOUNTER — Other Ambulatory Visit (HOSPITAL_COMMUNITY): Payer: Self-pay

## 2023-07-13 MED ORDER — MEDROXYPROGESTERONE ACETATE 10 MG PO TABS
ORAL_TABLET | ORAL | 0 refills | Status: DC
  Filled 2023-07-13: qty 10, 10d supply, fill #0

## 2023-07-13 MED ORDER — ALPRAZOLAM 1 MG PO TABS
1.0000 mg | ORAL_TABLET | Freq: Every morning | ORAL | 0 refills | Status: DC
  Filled 2023-07-14 (×2): qty 10, 10d supply, fill #0

## 2023-07-13 MED ORDER — ALPRAZOLAM 2 MG PO TABS
2.0000 mg | ORAL_TABLET | Freq: Every day | ORAL | 0 refills | Status: DC
  Filled 2023-07-14 (×2): qty 10, 10d supply, fill #0

## 2023-07-14 ENCOUNTER — Other Ambulatory Visit (HOSPITAL_COMMUNITY): Payer: Self-pay

## 2023-07-17 ENCOUNTER — Other Ambulatory Visit (HOSPITAL_COMMUNITY): Payer: Self-pay

## 2023-07-23 ENCOUNTER — Other Ambulatory Visit (HOSPITAL_COMMUNITY): Payer: Self-pay

## 2023-07-23 ENCOUNTER — Other Ambulatory Visit: Payer: Self-pay

## 2023-07-23 MED ORDER — ALPRAZOLAM 2 MG PO TABS
2.0000 mg | ORAL_TABLET | Freq: Every day | ORAL | 0 refills | Status: DC
  Filled 2023-07-23 – 2023-07-27 (×2): qty 30, 30d supply, fill #0
  Filled 2023-08-10 – 2023-08-24 (×3): qty 30, 30d supply, fill #1
  Filled 2023-09-17 – 2023-09-24 (×2): qty 30, 30d supply, fill #2

## 2023-07-23 MED ORDER — CLINDAMYCIN PHOSPHATE 1 % EX SOLN
1.0000 | Freq: Two times a day (BID) | CUTANEOUS | 5 refills | Status: AC
  Filled 2023-07-23: qty 60, 30d supply, fill #0
  Filled 2023-07-28: qty 180, fill #0
  Filled 2023-07-31: qty 180, 90d supply, fill #0
  Filled 2023-08-10 – 2024-01-30 (×3): qty 60, 30d supply, fill #0
  Filled 2024-03-03 – 2024-04-14 (×2): qty 60, 30d supply, fill #1
  Filled 2024-06-26: qty 60, 30d supply, fill #2

## 2023-07-23 MED ORDER — LINZESS 72 MCG PO CAPS
72.0000 ug | ORAL_CAPSULE | Freq: Every day | ORAL | 2 refills | Status: DC
  Filled 2023-07-23 (×2): qty 30, 30d supply, fill #0
  Filled 2023-07-27 – 2023-07-31 (×2): qty 90, 90d supply, fill #0
  Filled 2023-08-09 – 2023-09-17 (×2): qty 30, 30d supply, fill #0
  Filled 2023-10-19: qty 90, 90d supply, fill #0

## 2023-07-23 MED ORDER — AZITHROMYCIN 250 MG PO TABS
ORAL_TABLET | ORAL | 0 refills | Status: DC
  Filled 2023-07-23: qty 6, 5d supply, fill #0

## 2023-07-23 MED ORDER — AMITRIPTYLINE HCL 75 MG PO TABS
75.0000 mg | ORAL_TABLET | Freq: Every day | ORAL | 1 refills | Status: DC
  Filled 2023-07-23: qty 30, 30d supply, fill #0
  Filled 2023-07-27 – 2023-07-28 (×2): qty 90, fill #0
  Filled 2023-07-31: qty 90, 90d supply, fill #0
  Filled 2023-08-09 – 2023-08-21 (×3): qty 30, 30d supply, fill #0
  Filled 2023-09-17: qty 30, 30d supply, fill #1

## 2023-07-23 MED ORDER — PROPRANOLOL HCL 10 MG PO TABS
10.0000 mg | ORAL_TABLET | Freq: Three times a day (TID) | ORAL | 1 refills | Status: DC
  Filled 2023-07-23: qty 90, 30d supply, fill #0
  Filled 2023-07-28: qty 270, fill #0
  Filled 2023-09-17: qty 90, 30d supply, fill #0

## 2023-07-23 MED ORDER — ALPRAZOLAM 1 MG PO TABS
1.0000 mg | ORAL_TABLET | Freq: Every morning | ORAL | 0 refills | Status: DC
  Filled 2023-07-23 – 2023-07-27 (×2): qty 30, 30d supply, fill #0
  Filled 2023-08-10 – 2023-08-21 (×2): qty 30, 30d supply, fill #1
  Filled 2023-09-17 – 2023-09-24 (×2): qty 30, 30d supply, fill #2

## 2023-07-23 MED ORDER — VITAMIN D (ERGOCALCIFEROL) 1.25 MG (50000 UNIT) PO CAPS
ORAL_CAPSULE | ORAL | 3 refills | Status: DC
  Filled 2023-07-23: qty 5, 35d supply, fill #0
  Filled 2023-09-17: qty 5, 35d supply, fill #1

## 2023-07-23 MED ORDER — MEDROXYPROGESTERONE ACETATE 10 MG PO TABS
ORAL_TABLET | ORAL | 0 refills | Status: DC
  Filled 2023-07-23: qty 10, 10d supply, fill #0

## 2023-07-23 MED ORDER — VALACYCLOVIR HCL 1 G PO TABS
ORAL_TABLET | ORAL | 1 refills | Status: DC
  Filled 2023-07-23: qty 90, 90d supply, fill #0

## 2023-07-23 MED ORDER — GABAPENTIN 300 MG PO CAPS
300.0000 mg | ORAL_CAPSULE | Freq: Two times a day (BID) | ORAL | 1 refills | Status: DC
  Filled 2023-07-23: qty 60, 30d supply, fill #0
  Filled 2023-07-28: qty 180, fill #0
  Filled 2023-07-31: qty 180, 90d supply, fill #0
  Filled 2023-09-17: qty 60, 30d supply, fill #0
  Filled 2024-03-03: qty 60, 30d supply, fill #1

## 2023-07-24 ENCOUNTER — Other Ambulatory Visit (HOSPITAL_COMMUNITY): Payer: Self-pay

## 2023-07-27 ENCOUNTER — Other Ambulatory Visit (HOSPITAL_COMMUNITY): Payer: Self-pay

## 2023-07-27 ENCOUNTER — Other Ambulatory Visit: Payer: Self-pay

## 2023-07-28 ENCOUNTER — Other Ambulatory Visit (HOSPITAL_COMMUNITY): Payer: Self-pay

## 2023-07-31 ENCOUNTER — Other Ambulatory Visit (HOSPITAL_COMMUNITY): Payer: Self-pay

## 2023-07-31 ENCOUNTER — Other Ambulatory Visit: Payer: Self-pay

## 2023-08-01 ENCOUNTER — Other Ambulatory Visit (HOSPITAL_COMMUNITY): Payer: Self-pay

## 2023-08-03 ENCOUNTER — Encounter (HOSPITAL_COMMUNITY): Payer: Self-pay

## 2023-08-03 ENCOUNTER — Other Ambulatory Visit (HOSPITAL_COMMUNITY): Payer: Self-pay

## 2023-08-03 ENCOUNTER — Other Ambulatory Visit: Payer: Self-pay

## 2023-08-09 ENCOUNTER — Other Ambulatory Visit (HOSPITAL_COMMUNITY): Payer: Self-pay

## 2023-08-09 MED ORDER — MEDROXYPROGESTERONE ACETATE 10 MG PO TABS
ORAL_TABLET | ORAL | 3 refills | Status: AC
  Filled 2023-08-09 – 2023-08-21 (×2): qty 10, 10d supply, fill #0

## 2023-08-10 ENCOUNTER — Other Ambulatory Visit (HOSPITAL_COMMUNITY): Payer: Self-pay

## 2023-08-10 ENCOUNTER — Other Ambulatory Visit: Payer: Self-pay

## 2023-08-13 ENCOUNTER — Other Ambulatory Visit (HOSPITAL_COMMUNITY): Payer: Self-pay

## 2023-08-15 ENCOUNTER — Other Ambulatory Visit (HOSPITAL_COMMUNITY): Payer: Self-pay

## 2023-08-17 ENCOUNTER — Other Ambulatory Visit (HOSPITAL_COMMUNITY): Payer: Self-pay

## 2023-08-18 ENCOUNTER — Other Ambulatory Visit (HOSPITAL_COMMUNITY): Payer: Self-pay

## 2023-08-21 ENCOUNTER — Other Ambulatory Visit (HOSPITAL_COMMUNITY): Payer: Self-pay

## 2023-08-21 ENCOUNTER — Other Ambulatory Visit: Payer: Self-pay

## 2023-08-22 ENCOUNTER — Other Ambulatory Visit (HOSPITAL_COMMUNITY): Payer: Self-pay

## 2023-08-23 ENCOUNTER — Other Ambulatory Visit (HOSPITAL_COMMUNITY): Payer: Self-pay

## 2023-08-24 ENCOUNTER — Other Ambulatory Visit: Payer: Self-pay

## 2023-08-31 ENCOUNTER — Other Ambulatory Visit (HOSPITAL_COMMUNITY): Payer: Self-pay

## 2023-08-31 MED ORDER — METHYLPREDNISOLONE 4 MG PO TBPK
ORAL_TABLET | ORAL | 0 refills | Status: DC
  Filled 2023-08-31: qty 21, 6d supply, fill #0

## 2023-08-31 MED ORDER — AMOXICILLIN-POT CLAVULANATE 875-125 MG PO TABS
1.0000 | ORAL_TABLET | Freq: Two times a day (BID) | ORAL | 0 refills | Status: DC
  Filled 2023-08-31: qty 14, 7d supply, fill #0

## 2023-08-31 MED ORDER — ACYCLOVIR 800 MG PO TABS
800.0000 mg | ORAL_TABLET | Freq: Two times a day (BID) | ORAL | 3 refills | Status: DC
  Filled 2023-08-31: qty 60, 30d supply, fill #0
  Filled 2023-09-17: qty 60, 30d supply, fill #1
  Filled 2023-10-19 (×2): qty 60, 30d supply, fill #2
  Filled 2024-01-16 – 2024-01-30 (×2): qty 60, 30d supply, fill #3

## 2023-08-31 MED ORDER — FLUCONAZOLE 150 MG PO TABS
150.0000 mg | ORAL_TABLET | Freq: Every day | ORAL | 0 refills | Status: DC
  Filled 2023-08-31: qty 2, 30d supply, fill #0

## 2023-09-17 ENCOUNTER — Other Ambulatory Visit (HOSPITAL_COMMUNITY): Payer: Self-pay

## 2023-09-17 ENCOUNTER — Other Ambulatory Visit: Payer: Self-pay | Admitting: Internal Medicine

## 2023-09-17 DIAGNOSIS — K2970 Gastritis, unspecified, without bleeding: Secondary | ICD-10-CM

## 2023-09-17 DIAGNOSIS — K219 Gastro-esophageal reflux disease without esophagitis: Secondary | ICD-10-CM

## 2023-09-18 ENCOUNTER — Other Ambulatory Visit: Payer: Self-pay

## 2023-09-18 ENCOUNTER — Other Ambulatory Visit (HOSPITAL_COMMUNITY): Payer: Self-pay

## 2023-09-18 MED ORDER — NYSTATIN 100000 UNIT/ML MT SUSP
5.0000 mL | Freq: Three times a day (TID) | OROMUCOSAL | 3 refills | Status: DC
  Filled 2023-09-18: qty 210, 14d supply, fill #0
  Filled 2023-12-20: qty 210, 14d supply, fill #1
  Filled 2024-01-16 – 2024-01-30 (×2): qty 210, 14d supply, fill #2
  Filled 2024-02-12 – 2024-03-17 (×4): qty 210, 14d supply, fill #3

## 2023-09-20 ENCOUNTER — Other Ambulatory Visit (HOSPITAL_COMMUNITY): Payer: Self-pay

## 2023-09-24 ENCOUNTER — Other Ambulatory Visit (HOSPITAL_COMMUNITY): Payer: Self-pay

## 2023-09-25 ENCOUNTER — Other Ambulatory Visit (HOSPITAL_BASED_OUTPATIENT_CLINIC_OR_DEPARTMENT_OTHER): Payer: Self-pay

## 2023-10-05 ENCOUNTER — Other Ambulatory Visit (HOSPITAL_COMMUNITY): Payer: Self-pay

## 2023-10-05 MED ORDER — LINZESS 72 MCG PO CAPS
72.0000 ug | ORAL_CAPSULE | Freq: Every day | ORAL | 2 refills | Status: DC
  Filled 2023-10-19: qty 90, 90d supply, fill #0
  Filled 2023-12-20: qty 30, 30d supply, fill #0
  Filled 2023-12-20: qty 90, 90d supply, fill #0
  Filled 2024-01-16 – 2024-01-30 (×2): qty 30, 30d supply, fill #1
  Filled 2024-03-03: qty 30, 30d supply, fill #2

## 2023-10-05 MED ORDER — ALPRAZOLAM 1 MG PO TABS
1.0000 mg | ORAL_TABLET | Freq: Every morning | ORAL | 0 refills | Status: DC
  Filled 2023-10-19 – 2023-10-23 (×2): qty 90, 90d supply, fill #0
  Filled ????-??-??: fill #0

## 2023-10-05 MED ORDER — PROPRANOLOL HCL 10 MG PO TABS
10.0000 mg | ORAL_TABLET | Freq: Three times a day (TID) | ORAL | 1 refills | Status: DC
  Filled 2023-12-20: qty 270, 90d supply, fill #0
  Filled 2023-12-20: qty 90, 30d supply, fill #0
  Filled 2024-01-16 – 2024-01-30 (×2): qty 90, 30d supply, fill #1
  Filled 2024-03-03: qty 90, 30d supply, fill #2

## 2023-10-05 MED ORDER — ACCRUFER 30 MG PO CAPS
30.0000 mg | ORAL_CAPSULE | Freq: Two times a day (BID) | ORAL | 3 refills | Status: AC
  Filled 2023-10-05 – 2024-03-26 (×8): qty 60, 30d supply, fill #0

## 2023-10-05 MED ORDER — METOPROLOL TARTRATE 50 MG PO TABS
50.0000 mg | ORAL_TABLET | Freq: Two times a day (BID) | ORAL | 5 refills | Status: DC
  Filled 2023-12-20: qty 60, 30d supply, fill #0

## 2023-10-05 MED ORDER — NEOMYCIN-POLYMYXIN-HC 3.5-10000-1 OT SUSP
4.0000 [drp] | Freq: Three times a day (TID) | OTIC | 3 refills | Status: AC
  Filled 2023-10-05: qty 10, 7d supply, fill #0
  Filled 2023-10-19 – 2023-12-20 (×3): qty 10, 17d supply, fill #0
  Filled 2024-01-16: qty 10, 17d supply, fill #1
  Filled 2024-01-30: qty 10, 8d supply, fill #1
  Filled 2024-02-12 – 2024-03-17 (×4): qty 10, 8d supply, fill #2
  Filled 2024-03-31 – 2024-04-14 (×2): qty 10, 8d supply, fill #3

## 2023-10-05 MED ORDER — VITAMIN D (ERGOCALCIFEROL) 1.25 MG (50000 UNIT) PO CAPS
50000.0000 [IU] | ORAL_CAPSULE | ORAL | 3 refills | Status: DC
  Filled 2023-12-20: qty 5, 35d supply, fill #0
  Filled 2023-12-20: qty 4, 28d supply, fill #0
  Filled 2024-02-12 – 2024-02-19 (×2): qty 4, 28d supply, fill #1

## 2023-10-05 MED ORDER — ALPRAZOLAM 2 MG PO TABS
2.0000 mg | ORAL_TABLET | Freq: Every day | ORAL | 0 refills | Status: DC
  Filled 2023-10-05: qty 30, 30d supply, fill #0
  Filled 2023-10-19 – 2023-10-23 (×2): qty 90, 90d supply, fill #0
  Filled ????-??-??: fill #0

## 2023-10-05 MED ORDER — AMITRIPTYLINE HCL 75 MG PO TABS
75.0000 mg | ORAL_TABLET | Freq: Every day | ORAL | 1 refills | Status: DC
  Filled 2023-10-05: qty 30, 30d supply, fill #0
  Filled 2023-10-19: qty 90, 90d supply, fill #0
  Filled 2023-12-20: qty 90, 90d supply, fill #1
  Filled 2024-01-16 – 2024-02-12 (×2): qty 30, 30d supply, fill #1

## 2023-10-05 MED ORDER — GABAPENTIN 300 MG PO CAPS
300.0000 mg | ORAL_CAPSULE | Freq: Two times a day (BID) | ORAL | 1 refills | Status: DC
  Filled 2023-12-20: qty 60, 30d supply, fill #0
  Filled 2023-12-20: qty 180, 90d supply, fill #0
  Filled 2024-01-16 – 2024-01-30 (×2): qty 60, 30d supply, fill #1
  Filled 2024-02-14: qty 60, 30d supply, fill #2

## 2023-10-15 ENCOUNTER — Other Ambulatory Visit (HOSPITAL_COMMUNITY): Payer: Self-pay

## 2023-10-18 ENCOUNTER — Encounter (HOSPITAL_BASED_OUTPATIENT_CLINIC_OR_DEPARTMENT_OTHER): Payer: Self-pay

## 2023-10-18 ENCOUNTER — Emergency Department (HOSPITAL_BASED_OUTPATIENT_CLINIC_OR_DEPARTMENT_OTHER): Payer: Self-pay

## 2023-10-18 ENCOUNTER — Ambulatory Visit
Admission: EM | Admit: 2023-10-18 | Discharge: 2023-10-18 | Disposition: A | Discharge status: Home / Self Care | Payer: Self-pay | Attending: Family Medicine | Admitting: Family Medicine

## 2023-10-18 ENCOUNTER — Other Ambulatory Visit: Payer: Self-pay

## 2023-10-18 ENCOUNTER — Emergency Department (HOSPITAL_BASED_OUTPATIENT_CLINIC_OR_DEPARTMENT_OTHER)
Admission: EM | Admit: 2023-10-18 | Discharge: 2023-10-18 | Disposition: A | Discharge status: Home / Self Care | Payer: Self-pay | Attending: Emergency Medicine | Admitting: Emergency Medicine

## 2023-10-18 DIAGNOSIS — R109 Unspecified abdominal pain: Secondary | ICD-10-CM

## 2023-10-18 DIAGNOSIS — E876 Hypokalemia: Secondary | ICD-10-CM | POA: Insufficient documentation

## 2023-10-18 LAB — CBC WITH DIFFERENTIAL/PLATELET
Abs Immature Granulocytes: 0.01 10*3/uL (ref 0.00–0.07)
Basophils Absolute: 0 10*3/uL (ref 0.0–0.1)
Basophils Relative: 0 %
Eosinophils Absolute: 0 10*3/uL (ref 0.0–0.5)
Eosinophils Relative: 0 %
HCT: 38.7 % (ref 36.0–46.0)
Hemoglobin: 12.9 g/dL (ref 12.0–15.0)
Immature Granulocytes: 0 %
Lymphocytes Relative: 25 %
Lymphs Abs: 1.7 10*3/uL (ref 0.7–4.0)
MCH: 31.9 pg (ref 26.0–34.0)
MCHC: 33.3 g/dL (ref 30.0–36.0)
MCV: 95.6 fL (ref 80.0–100.0)
Monocytes Absolute: 0.7 10*3/uL (ref 0.1–1.0)
Monocytes Relative: 10 %
Neutro Abs: 4.4 10*3/uL (ref 1.7–7.7)
Neutrophils Relative %: 65 %
Platelets: 251 10*3/uL (ref 150–400)
RBC: 4.05 MIL/uL (ref 3.87–5.11)
RDW: 12.3 % (ref 11.5–15.5)
WBC: 6.8 10*3/uL (ref 4.0–10.5)
nRBC: 0 % (ref 0.0–0.2)

## 2023-10-18 LAB — POCT URINALYSIS DIP (MANUAL ENTRY)
Bilirubin, UA: NEGATIVE
Blood, UA: NEGATIVE
Glucose, UA: NEGATIVE mg/dL
Ketones, POC UA: NEGATIVE mg/dL
Leukocytes, UA: NEGATIVE
Nitrite, UA: NEGATIVE
Protein Ur, POC: NEGATIVE mg/dL
Spec Grav, UA: 1.01 (ref 1.010–1.025)
Urobilinogen, UA: 0.2 U/dL
pH, UA: 5.5 (ref 5.0–8.0)

## 2023-10-18 LAB — BASIC METABOLIC PANEL
Anion gap: 8 (ref 5–15)
BUN: 5 mg/dL — ABNORMAL LOW (ref 6–20)
CO2: 27 mmol/L (ref 22–32)
Calcium: 9.1 mg/dL (ref 8.9–10.3)
Chloride: 103 mmol/L (ref 98–111)
Creatinine, Ser: 0.74 mg/dL (ref 0.44–1.00)
GFR, Estimated: >60 mL/min (ref >60)
Glucose, Bld: 92 mg/dL (ref 70–99)
Potassium: 3.2 mmol/L — ABNORMAL LOW (ref 3.5–5.1)
Sodium: 138 mmol/L (ref 135–145)

## 2023-10-18 LAB — PREGNANCY, URINE: Preg Test, Ur: NEGATIVE

## 2023-10-18 MED ORDER — METAXALONE 800 MG PO TABS
800.0000 mg | ORAL_TABLET | Freq: Three times a day (TID) | ORAL | 0 refills | Status: DC
  Filled 2023-10-18: qty 21, 7d supply, fill #0

## 2023-10-18 MED ORDER — OXYCODONE-ACETAMINOPHEN 5-325 MG PO TABS
1.0000 | ORAL_TABLET | Freq: Once | ORAL | Status: AC
  Administered 2023-10-18: 1 via ORAL
  Filled 2023-10-18: qty 1

## 2023-10-18 MED ORDER — ONDANSETRON HCL 4 MG PO TABS
4.0000 mg | ORAL_TABLET | Freq: Three times a day (TID) | ORAL | 0 refills | Status: DC | PRN
  Filled 2023-10-18: qty 12, 4d supply, fill #0

## 2023-10-18 NOTE — Discharge Instructions (Addendum)
Advised patient to go to [facility] or [facility] for further evaluation of the left flank pain and possible CT of renal pelvis to rule out kidney stone.

## 2023-10-18 NOTE — Discharge Instructions (Signed)
Today you are seen for left flank pain, I suspect that this pain is musculoskeletal in nature.  Please take your muscle relaxer as needed for pain and Zofran as needed for nausea.  Please follow-up with your primary care if your symptoms persist.  Thank you for letting us treat you today. After reviewing your labs and imaging, I feel you are safe to go home. Please follow up with your PCP in the next several days and provide them with your records from this visit. Return to the Emergency Room if pain becomes severe or symptoms worsen.

## 2023-10-18 NOTE — ED Notes (Signed)
Transport to ct

## 2023-10-18 NOTE — ED Provider Notes (Signed)
[facility] EMERGENCY DEPARTMENT AT [facility] Provider Note   CSN: [id number] Arrival date & time: 10/18/23  1342     History  Chief Complaint  Patient presents with   Flank Pain    Christina [name] is a 43 y.o. female presents today from urgent care for kidney stone rule out.  Patient began having left flank pain that woke her from her sleep around 2 AM this morning.  Patient took 2000 mg of Tylenol around 215 and 800 mg of ibuprofen around 9.  Patient denies any dysuria, hematuria, abdominal pain, vaginal discharge, vaginal bleeding, blood in stool, shortness of breath, or chest pain.  Patient does endorse past surgical history of appendectomy and C-section.  Patient states that the pain is worse with movement and pain does cause some nausea as well.   Flank Pain       Home Medications Prior to Admission medications   Medication Sig Start Date End Date Taking? Authorizing Provider  metaxalone (SKELAXIN) 800 MG tablet Take 1 tablet (800 mg total) by mouth 3 (three) times daily. 10/18/23  Yes Dolphus Jenny, PA-C  ondansetron (ZOFRAN) 4 MG tablet Take 1 tablet (4 mg total) by mouth every 8 (eight) hours as needed for nausea or vomiting. 10/18/23  Yes Dolphus Jenny, PA-C  acyclovir (ZOVIRAX) 800 MG tablet Take 1 tablet (800 mg total) by mouth 2 (two) times daily. 08/31/23     ALPRAZolam (XANAX) 1 MG tablet Take 1 tablet (1 mg total) by mouth every morning. 10/05/23     alprazolam (XANAX) 2 MG tablet Take 1 tablet (2 mg total) by mouth nightly at bedtime 07/11/22     alprazolam (XANAX) 2 MG tablet Take 1 tablet (2 mg total) by mouth at bedtime. 07/23/23     alprazolam (XANAX) 2 MG tablet Take 1 tablet (2 mg total) by mouth nightly. 10/05/23     amitriptyline (ELAVIL) 75 MG tablet Take 1 tablet (75 mg total) by mouth at bedtime. 07/09/17   Junie Spencer, FNP  amitriptyline (ELAVIL) 75 MG tablet Take 1 tablet (75 mg total) by mouth daily. 01/08/23     amitriptyline (ELAVIL) 75  MG tablet Take 1 tablet (75 mg total) by mouth daily as directed. 03/16/23     amitriptyline (ELAVIL) 75 MG tablet Take 1 tablet (75 mg total) by mouth daily as directed. 10/05/23     amoxicillin-clavulanate (AUGMENTIN) 875-125 MG tablet Take 1 tablet by mouth every 12 (twelve) hours. 08/31/23     clindamycin (CLEOCIN T) 1 % external solution Apply topically 2 (two) times daily. 09/27/22     clindamycin (CLEOCIN T) 1 % external solution Apply 1 Application topically 2 (two) times daily. 07/23/23     clindamycin (CLINDAGEL) 1 % gel Apply topically 2 times daily. 01/20/22     Clindamycin Phosphate (XACIATO) 2 % GEL Insert 1 applicatorful vaginally once a week for 12 weeks 10/19/22     clotrimazole (MYCELEX) 10 MG troche Take 1 tablet (10 mg total) by mouth 3 (three) times daily as directed for 10 days. 01/08/23     dexlansoprazole (DEXILANT) 60 MG capsule Take 1 capsule (60 mg total) by mouth daily as directed 09/08/22     dexlansoprazole (DEXILANT) 60 MG capsule Take 1 capsule (60 mg total) by mouth daily as directed 09/27/22     dexlansoprazole (DEXILANT) 60 MG capsule Take 1 capsule (60 mg total) by mouth daily. 03/16/23     famotidine (PEPCID) 40 MG tablet Take 1  tablet (40 mg total) by mouth 2 (two) times daily. 06/18/23   Unk Lightning, PA  Ferric Maltol (ACCRUFER) 30 MG CAPS Take 1 capsule (30 mg total) by mouth 2 (two) times daily as directed. 10/05/23     fluconazole (DIFLUCAN) 150 MG tablet Take 1 tablet (150 mg total) by mouth now then repeat in 3 days as directed. 12/27/22     fluconazole (DIFLUCAN) 150 MG tablet Take 1 tablet (150 mg total) by mouth daily for 2 days 01/08/23     fluconazole (DIFLUCAN) 150 MG tablet Take 1 tablet (150 mg total) by mouth daily. 08/31/23     fluconazole (DIFLUCAN) 200 MG tablet Take 2 tablets daily for 1 dose, followed by 1 tablet daily for 13 days. 10/27/22   Imogene Burn, MD  fluconazole (DIFLUCAN) 200 MG tablet Take 2 tablets (400 mg total) by mouth for 1 day  then take 1 tablet daily for 13 days 03/16/23     gabapentin (NEURONTIN) 300 MG capsule Take 1 capsule (300 mg total) by mouth 2 (two) times daily. 02/03/23   Garlon Hatchet, PA-C  gabapentin (NEURONTIN) 300 MG capsule Take 1 capsule (300 mg total) by mouth 2 (two) times daily as directed 03/06/23   Diamantina Providence, FNP  gabapentin (NEURONTIN) 300 MG capsule Take 1 capsule (300 mg total) by mouth 2 (two) times daily. 07/23/23     gabapentin (NEURONTIN) 300 MG capsule Take 1 capsule (300 mg total) by mouth 2 (two) times daily as directed. 10/05/23     hydrocortisone (ANUSOL-HC) 2.5 % rectal cream Apply 1 application topically to the affected area(s) daily. 02/15/21     lidocaine (LIDODERM) 5 % Place 1 patch onto the skin daily. Remove & Discard patch within 12 hours or as directed by MD 03/13/22   Kommor, Wyn Forster, MD  lidocaine (XYLOCAINE) 5 % ointment Apply to affected area 3 times a day as needed for 14 days 12/07/22     linaclotide (LINZESS) 72 MCG capsule Take 1 capsule (72 mcg total) by mouth daily as directed. 07/11/22     linaclotide (LINZESS) 72 MCG capsule Take 1 capsule (72 mcg total) by mouth daily as directed. 03/16/23     linaclotide (LINZESS) 72 MCG capsule Take 1 capsule by mouth every day as directed. 07/23/23     linaclotide (LINZESS) 72 MCG capsule Take 1 capsule (72 mcg total) by mouth daily as directed. 10/05/23     medroxyPROGESTERone (PROVERA) 10 MG tablet Take 1 tablet (10 mg total) by mouth with food at bedtime. 08/09/23     methocarbamol (ROBAXIN-750) 750 MG tablet Take 1 tablet (750 mg total) by mouth every 8 (eight) hours as needed for muscle spasms. 02/25/23   Emelia Loron, MD  metoprolol tartrate (LOPRESSOR) 50 MG tablet Take 1 tablet (50 mg total) by mouth 2 (two) times daily as directed 03/18/23     metoprolol tartrate (LOPRESSOR) 50 MG tablet Take 1 tablet (50 mg) by mouth 2 times daily. 04/13/23     metoprolol tartrate (LOPRESSOR) 50 MG tablet Take 1 tablet (50 mg total) by  mouth 2 (two) times daily as directed. 10/05/23     metroNIDAZOLE (METROGEL) 0.75 % vaginal gel Place 1 Applicatorful vaginally every week for 90 days 09/27/22     neomycin-polymyxin-hydrocortisone (CORTISPORIN) 3.5-10000-1 OTIC suspension INSTILL 4 DROPS INTO AFFECTED EAR(S) BY OTIC ROUTE 3 TIMES PER DAY 10/05/23     nitroGLYCERIN (NITROSTAT) 0.4 MG SL tablet Place 1 tablet (0.4 mg total) under  the tongue as needed. 09/12/22     nitroGLYCERIN (NITROSTAT) 0.4 MG SL tablet Place 1 tablet (0.4 mg total) under the tongue daily as needed 03/18/23     nystatin (MYCOSTATIN) 100000 UNIT/ML suspension Swish and spit 5 mLs (500,000 Units total) by mouth 3 (three) times daily. 09/18/23     oxyCODONE (OXY IR/ROXICODONE) 5 MG immediate release tablet Take 1 tablet (5 mg total) by mouth every 6 (six) hours as needed for moderate pain, severe pain or breakthrough pain. 02/25/23   Emelia Loron, MD  potassium bicarbonate (KLOR-CON/EF) 25 MEQ disintegrating tablet Dissolve 1 tablet in water and drink 2 times a day with meals 11/08/22     propranolol (INDERAL) 10 MG tablet Take 1 tablet (10 mg total) by mouth 3 (three) times daily as needed. 09/26/22   Custovic, Rozell Searing, DO  propranolol (INDERAL) 10 MG tablet Take 1 tablet (10 mg total) by mouth 3 (three) times daily as directed. 03/16/23     propranolol (INDERAL) 10 MG tablet Take 1 tablet (10 mg total) by mouth 3 (three) times daily. 07/23/23     propranolol (INDERAL) 10 MG tablet Take 1 tablet (10 mg total) by mouth 3 (three) times daily as directed. 10/05/23     sucralfate (CARAFATE) 1 g tablet Take 1 tablet (1 g total) by mouth 3 (three) times daily before meals. 07/11/22     valACYclovir (VALTREX) 1000 MG tablet Take 1 tablet (1,000 mg total) by mouth every morning. 10/31/22     valACYclovir (VALTREX) 1000 MG tablet Take 1 tablet (1,000 mg total) by mouth 2 (two) times daily for outbreaks then use daily 12/07/22     valACYclovir (VALTREX) 1000 MG tablet Take 1 tablet  (1,000 mg total) by mouth in the morning. 12/07/22     valACYclovir (VALTREX) 1000 MG tablet TAKE 1 TABLET EVERY DAY BY MOUTH IN THE MORNING 07/23/23     Vitamin D, Ergocalciferol, (DRISDOL) 1.25 MG (50000 UNIT) CAPS capsule Take 1 capsule (50,000 Units total) by mouth once a week as directed. 03/16/23     Vitamin D, Ergocalciferol, (DRISDOL) 1.25 MG (50000 UNIT) CAPS capsule Take 1 capsule every week by oral route as directed for 30 days. 07/23/23     Vitamin D, Ergocalciferol, (DRISDOL) 1.25 MG (50000 UNIT) CAPS capsule Take 1 capsule (50,000 Units total) by mouth once a week as directed. 10/05/23         Allergies    Patient has no known allergies.    Review of Systems   Review of Systems  Genitourinary:  Positive for flank pain.    Physical Exam Updated Vital Signs BP 121/85   Pulse 82   Temp 98.9 F (37.2 C) (Temporal)   Resp 16   SpO2 100%  Physical Exam Vitals and nursing note reviewed.  Constitutional:      General: She is not in acute distress.    Appearance: She is well-developed. She is not toxic-appearing.  HENT:     Head: Normocephalic and atraumatic.     Right Ear: External ear normal.     Left Ear: External ear normal.     Nose: Nose normal.  Eyes:     Extraocular Movements: Extraocular movements intact.     Conjunctiva/sclera: Conjunctivae normal.  Cardiovascular:     Rate and Rhythm: Normal rate and regular rhythm.     Pulses: Normal pulses.     Heart sounds: Normal heart sounds. No murmur heard. Pulmonary:     Effort: Pulmonary effort is  normal. No respiratory distress.     Breath sounds: Normal breath sounds.  Abdominal:     General: Bowel sounds are normal.     Palpations: Abdomen is soft.     Tenderness: There is no abdominal tenderness. There is left CVA tenderness. There is no right CVA tenderness or guarding.  Musculoskeletal:        General: No swelling.     Cervical back: Normal range of motion and neck supple.     Comments: TTP to the left  flank/lumbar paraspinal muscles  Skin:    General: Skin is warm and dry.     Capillary Refill: Capillary refill takes less than 2 seconds.  Neurological:     General: No focal deficit present.     Mental Status: She is alert.     Motor: No weakness.  Psychiatric:        Mood and Affect: Mood normal.     ED Results / Procedures / Treatments   Labs (all labs ordered are listed, but only abnormal results are displayed) Labs Reviewed  BASIC METABOLIC PANEL - Abnormal; Notable for the following components:      Result Value   Potassium 3.2 (*)    BUN 5 (*)    All other components within normal limits  PREGNANCY, URINE  CBC WITH DIFFERENTIAL/PLATELET    EKG None  Radiology CT Renal Stone Study Result Date: 10/18/2023 CLINICAL DATA:  Acute left flank pain. EXAM: CT ABDOMEN AND PELVIS WITHOUT CONTRAST TECHNIQUE: Multidetector CT imaging of the abdomen and pelvis was performed following the standard protocol without IV contrast. RADIATION DOSE REDUCTION: This exam was performed according to the departmental dose-optimization program which includes automated exposure control, adjustment of the mA and/or kV according to patient size and/or use of iterative reconstruction technique. COMPARISON:  February 25, 2023. FINDINGS: Lower chest: No acute abnormality. Hepatobiliary: No focal liver abnormality is seen. No gallstones, gallbladder wall thickening, or biliary dilatation. Pancreas: Unremarkable. No pancreatic ductal dilatation or surrounding inflammatory changes. Spleen: Normal in size without focal abnormality. Adrenals/Urinary Tract: Adrenal glands are unremarkable. Kidneys are normal, without renal calculi, focal lesion, or hydronephrosis. Bladder is unremarkable. Stomach/Bowel: The stomach appears normal. Status post appendectomy. No evidence of bowel obstruction or inflammation. Vascular/Lymphatic: No significant vascular findings are present. No enlarged abdominal or pelvic lymph nodes.  Reproductive: Uterus and bilateral adnexa are unremarkable. Other: No abdominal wall hernia or abnormality. No abdominopelvic ascites. Musculoskeletal: No acute or significant osseous findings. IMPRESSION: No acute abnormality seen in the abdomen or pelvis. Electronically Signed   By: Lupita Raider M.D.   On: 10/18/2023 17:17    Procedures Procedures    Medications Ordered in ED Medications  oxyCODONE-acetaminophen (PERCOCET/ROXICET) 5-325 MG per tablet 1 tablet (1 tablet Oral Given 10/18/23 1626)    ED Course/ Medical Decision Making/ A&P                                 Medical Decision Making Amount and/or Complexity of Data Reviewed Labs: ordered. Radiology: ordered.  Risk Prescription drug management.   This patient presents to the ED with chief complaint(s) of left flank pain with pertinent past medical history of none which further complicates the presenting complaint. The complaint involves an extensive differential diagnosis and also carries with it a high risk of complications and morbidity.    The differential diagnosis includes kidney stone, UTI, pyelonephritis, musculoskeletal pain  Additional history  obtained: Records reviewed Care Everywhere/External Records  ED Course and Reassessment: Patient given Percocet for pain  Independent labs interpretation:  The following labs were independently interpreted:  CBC: No notable findings BMP: Mild hypokalemia 3.2, decreased bun at 5 UA: No notable findings (from Urgent Care) Urine pregnancy: Negative  Independent visualization of imaging: - I independently visualized the following imaging with scope of interpretation limited to determining acute life threatening conditions related to emergency care: CT renal stone study, which revealed no acute abnormality seen in the abdomen or pelvis.  Consultation: - Consulted or discussed management/test interpretation w/ external professional: None  Consideration for  admission or further workup: Considered for mission further workup however patient's vital signs, physical exam, labs, and imaging were reassuring.  I suspect that the patient's pain may be musculoskeletal in nature.  Patient put on short course of muscle relaxers and given Zofran for nausea.  Patient to follow-up with primary care physician if her symptoms persist.        Final Clinical Impression(s) / ED Diagnoses Final diagnoses:  Left flank pain    Rx / DC Orders ED Discharge Orders          Ordered    ondansetron (ZOFRAN) 4 MG tablet  Every 8 hours PRN        10/18/23 1736    metaxalone (SKELAXIN) 800 MG tablet  3 times daily        10/18/23 1736              Dolphus Jenny, PA-C 10/18/23 1740    Rondel Baton, MD 10/22/23 [phone]

## 2023-10-18 NOTE — ED Notes (Signed)
Patient is being discharged from the Urgent Care and sent to the Emergency Department via POV. Per Trevor Iha FNP, patient is in need of higher level of care due to flank pain and need for advanced imaging. Patient is aware and verbalizes understanding of plan of care.  Vitals:   10/18/23 1230  BP: 134/83  Pulse: (!) 104  Resp: 18  Temp: 98.2 F (36.8 C)  SpO2: 100%

## 2023-10-18 NOTE — ED Provider Notes (Signed)
[facility]    CSN: [id number] Arrival date & time: 10/18/23  1209      History   Chief Complaint Chief Complaint  Patient presents with   Flank Pain    LT    HPI TERRIYAH WESTRA is a 43 y.o. female.   HPI 43 year old female presents of left flank pain since 0830 this morning.  Patient denies nausea vomiting.  Patient denies history of kidney stones.  PMH significant for abnormal Pap smear and migraine.  Patient is accompanied by her friend today.  Past Medical History:  Diagnosis Date   Abnormal Pap smear    BV (bacterial vaginosis)    GERD (gastroesophageal reflux disease)    Migraine     Patient Active Problem List   Diagnosis Date Noted   Migraine with aura 12/04/2012   Cephalgia 12/04/2012    Past Surgical History:  Procedure Laterality Date   APPENDECTOMY     CESAREAN SECTION     DENTAL SURGERY     LAPAROSCOPIC APPENDECTOMY N/A 02/25/2023   Procedure: APPENDECTOMY LAPAROSCOPIC;  Surgeon: Emelia Loron, MD;  Location: [facility];  Service: General;  Laterality: N/A;    OB History     Gravida  4   Para  2   Term  2   Preterm      AB  2   Living  2      SAB  2   IAB      Ectopic      Multiple      Live Births               Home Medications    Prior to Admission medications   Medication Sig Start Date End Date Taking? Authorizing Provider  acyclovir (ZOVIRAX) 800 MG tablet Take 1 tablet (800 mg total) by mouth 2 (two) times daily. 08/31/23     ALPRAZolam (XANAX) 1 MG tablet Take 1 tablet (1 mg total) by mouth every morning. 10/05/23     alprazolam (XANAX) 2 MG tablet Take 1 tablet (2 mg total) by mouth nightly at bedtime 07/11/22     alprazolam (XANAX) 2 MG tablet Take 1 tablet (2 mg total) by mouth at bedtime. 07/23/23     alprazolam (XANAX) 2 MG tablet Take 1 tablet (2 mg total) by mouth nightly. 10/05/23     amitriptyline (ELAVIL) 75 MG tablet Take 1 tablet (75 mg total) by mouth at bedtime. 07/09/17   Junie Spencer, FNP  amitriptyline (ELAVIL) 75 MG tablet Take 1 tablet (75 mg total) by mouth daily. 01/08/23     amitriptyline (ELAVIL) 75 MG tablet Take 1 tablet (75 mg total) by mouth daily as directed. 03/16/23     amitriptyline (ELAVIL) 75 MG tablet Take 1 tablet (75 mg total) by mouth daily as directed. 10/05/23     amoxicillin-clavulanate (AUGMENTIN) 875-125 MG tablet Take 1 tablet by mouth every 12 (twelve) hours. 08/31/23     clindamycin (CLEOCIN T) 1 % external solution Apply topically 2 (two) times daily. 09/27/22     clindamycin (CLEOCIN T) 1 % external solution Apply 1 Application topically 2 (two) times daily. 07/23/23     clindamycin (CLINDAGEL) 1 % gel Apply topically 2 times daily. 01/20/22     Clindamycin Phosphate (XACIATO) 2 % GEL Insert 1 applicatorful vaginally once a week for 12 weeks 10/19/22     clotrimazole (MYCELEX) 10 MG troche Take 1 tablet (10 mg total) by mouth 3 (three) times daily as  directed for 10 days. 01/08/23     dexlansoprazole (DEXILANT) 60 MG capsule Take 1 capsule (60 mg total) by mouth daily as directed 09/08/22     dexlansoprazole (DEXILANT) 60 MG capsule Take 1 capsule (60 mg total) by mouth daily as directed 09/27/22     dexlansoprazole (DEXILANT) 60 MG capsule Take 1 capsule (60 mg total) by mouth daily. 03/16/23     famotidine (PEPCID) 40 MG tablet Take 1 tablet (40 mg total) by mouth 2 (two) times daily. 06/18/23   Unk Lightning, PA  Ferric Maltol (ACCRUFER) 30 MG CAPS Take 1 capsule (30 mg total) by mouth 2 (two) times daily as directed. 10/05/23     fluconazole (DIFLUCAN) 150 MG tablet Take 1 tablet (150 mg total) by mouth now then repeat in 3 days as directed. 12/27/22     fluconazole (DIFLUCAN) 150 MG tablet Take 1 tablet (150 mg total) by mouth daily for 2 days 01/08/23     fluconazole (DIFLUCAN) 150 MG tablet Take 1 tablet (150 mg total) by mouth daily. 08/31/23     fluconazole (DIFLUCAN) 200 MG tablet Take 2 tablets daily for 1 dose, followed by 1 tablet  daily for 13 days. 10/27/22   Imogene Burn, MD  fluconazole (DIFLUCAN) 200 MG tablet Take 2 tablets (400 mg total) by mouth for 1 day then take 1 tablet daily for 13 days 03/16/23     gabapentin (NEURONTIN) 300 MG capsule Take 1 capsule (300 mg total) by mouth 2 (two) times daily. 02/03/23   Garlon Hatchet, PA-C  gabapentin (NEURONTIN) 300 MG capsule Take 1 capsule (300 mg total) by mouth 2 (two) times daily as directed 03/06/23   Diamantina Providence, FNP  gabapentin (NEURONTIN) 300 MG capsule Take 1 capsule (300 mg total) by mouth 2 (two) times daily. 07/23/23     gabapentin (NEURONTIN) 300 MG capsule Take 1 capsule (300 mg total) by mouth 2 (two) times daily as directed. 10/05/23     hydrocortisone (ANUSOL-HC) 2.5 % rectal cream Apply 1 application topically to the affected area(s) daily. 02/15/21     lidocaine (LIDODERM) 5 % Place 1 patch onto the skin daily. Remove & Discard patch within 12 hours or as directed by MD 03/13/22   Kommor, Wyn Forster, MD  lidocaine (XYLOCAINE) 5 % ointment Apply to affected area 3 times a day as needed for 14 days 12/07/22     linaclotide (LINZESS) 72 MCG capsule Take 1 capsule (72 mcg total) by mouth daily as directed. 07/11/22     linaclotide (LINZESS) 72 MCG capsule Take 1 capsule (72 mcg total) by mouth daily as directed. 03/16/23     linaclotide (LINZESS) 72 MCG capsule Take 1 capsule by mouth every day as directed. 07/23/23     linaclotide (LINZESS) 72 MCG capsule Take 1 capsule (72 mcg total) by mouth daily as directed. 10/05/23     medroxyPROGESTERone (PROVERA) 10 MG tablet Take 1 tablet (10 mg total) by mouth with food at bedtime. 08/09/23     methocarbamol (ROBAXIN-750) 750 MG tablet Take 1 tablet (750 mg total) by mouth every 8 (eight) hours as needed for muscle spasms. 02/25/23   Emelia Loron, MD  metoprolol tartrate (LOPRESSOR) 50 MG tablet Take 1 tablet (50 mg total) by mouth 2 (two) times daily as directed 03/18/23     metoprolol tartrate (LOPRESSOR) 50 MG tablet  Take 1 tablet (50 mg) by mouth 2 times daily. 04/13/23     metoprolol tartrate (LOPRESSOR) 50  MG tablet Take 1 tablet (50 mg total) by mouth 2 (two) times daily as directed. 10/05/23     metroNIDAZOLE (METROGEL) 0.75 % vaginal gel Place 1 Applicatorful vaginally every week for 90 days 09/27/22     neomycin-polymyxin-hydrocortisone (CORTISPORIN) 3.5-10000-1 OTIC suspension INSTILL 4 DROPS INTO AFFECTED EAR(S) BY OTIC ROUTE 3 TIMES PER DAY 10/05/23     nitroGLYCERIN (NITROSTAT) 0.4 MG SL tablet Place 1 tablet (0.4 mg total) under the tongue as needed. 09/12/22     nitroGLYCERIN (NITROSTAT) 0.4 MG SL tablet Place 1 tablet (0.4 mg total) under the tongue daily as needed 03/18/23     nystatin (MYCOSTATIN) 100000 UNIT/ML suspension Swish and spit 5 mLs (500,000 Units total) by mouth 3 (three) times daily. 09/18/23     oxyCODONE (OXY IR/ROXICODONE) 5 MG immediate release tablet Take 1 tablet (5 mg total) by mouth every 6 (six) hours as needed for moderate pain, severe pain or breakthrough pain. 02/25/23   Emelia Loron, MD  potassium bicarbonate (KLOR-CON/EF) 25 MEQ disintegrating tablet Dissolve 1 tablet in water and drink 2 times a day with meals 11/08/22     propranolol (INDERAL) 10 MG tablet Take 1 tablet (10 mg total) by mouth 3 (three) times daily as needed. 09/26/22   Custovic, Rozell Searing, DO  propranolol (INDERAL) 10 MG tablet Take 1 tablet (10 mg total) by mouth 3 (three) times daily as directed. 03/16/23     propranolol (INDERAL) 10 MG tablet Take 1 tablet (10 mg total) by mouth 3 (three) times daily. 07/23/23     propranolol (INDERAL) 10 MG tablet Take 1 tablet (10 mg total) by mouth 3 (three) times daily as directed. 10/05/23     sucralfate (CARAFATE) 1 g tablet Take 1 tablet (1 g total) by mouth 3 (three) times daily before meals. 07/11/22     valACYclovir (VALTREX) 1000 MG tablet Take 1 tablet (1,000 mg total) by mouth every morning. 10/31/22     valACYclovir (VALTREX) 1000 MG tablet Take 1 tablet (1,000 mg  total) by mouth 2 (two) times daily for outbreaks then use daily 12/07/22     valACYclovir (VALTREX) 1000 MG tablet Take 1 tablet (1,000 mg total) by mouth in the morning. 12/07/22     valACYclovir (VALTREX) 1000 MG tablet TAKE 1 TABLET EVERY DAY BY MOUTH IN THE MORNING 07/23/23     Vitamin D, Ergocalciferol, (DRISDOL) 1.25 MG (50000 UNIT) CAPS capsule Take 1 capsule (50,000 Units total) by mouth once a week as directed. 03/16/23     Vitamin D, Ergocalciferol, (DRISDOL) 1.25 MG (50000 UNIT) CAPS capsule Take 1 capsule every week by oral route as directed for 30 days. 07/23/23     Vitamin D, Ergocalciferol, (DRISDOL) 1.25 MG (50000 UNIT) CAPS capsule Take 1 capsule (50,000 Units total) by mouth once a week as directed. 10/05/23       Family History Family History  Problem Relation Age of Onset   Arrhythmia Mother    Hypertension Mother    Stroke Mother    Heart attack Mother    Cancer Father        brain and lung   Hypertension Father    Migraines Father    Colon cancer Neg Hx    Stomach cancer Neg Hx     Social History Social History   Tobacco Use   Smoking status: Never   Smokeless tobacco: Never  Vaping Use   Vaping status: Never Used  Substance Use Topics   Alcohol use: No  Drug use: No     Allergies   Patient has no known allergies.   Review of Systems Review of Systems  Genitourinary:  Positive for flank pain.     Physical Exam Triage Vital Signs ED Triage Vitals [10/18/23 1227]  Encounter Vitals Group     BP      Systolic BP Percentile      Diastolic BP Percentile      Pulse      Resp      Temp      Temp src      SpO2      Weight      Height      Head Circumference      Peak Flow      Pain Score 10     Pain Loc      Pain Education      Exclude from Growth Chart    No data found.  Updated Vital Signs BP 134/83 (BP Location: Right Arm)   Pulse (!) 104   Temp 98.2 F (36.8 C) (Oral)   Resp 18   SpO2 100%    Physical Exam Vitals and nursing  note reviewed.  Constitutional:      General: She is not in acute distress.    Appearance: Normal appearance. She is normal weight. She is ill-appearing.  HENT:     Head: Normocephalic and atraumatic.     Mouth/Throat:     Mouth: Mucous membranes are moist.     Pharynx: Oropharynx is clear.  Eyes:     Extraocular Movements: Extraocular movements intact.     Conjunctiva/sclera: Conjunctivae normal.     Pupils: Pupils are equal, round, and reactive to light.  Cardiovascular:     Rate and Rhythm: Normal rate and regular rhythm.     Pulses: Normal pulses.     Heart sounds: Normal heart sounds.  Pulmonary:     Effort: Pulmonary effort is normal.     Breath sounds: Normal breath sounds. No wheezing, rhonchi or rales.  Abdominal:     Tenderness: There is left CVA tenderness.  Musculoskeletal:        General: Normal range of motion.     Cervical back: Normal range of motion and neck supple. No tenderness.  Lymphadenopathy:     Cervical: No cervical adenopathy.  Skin:    General: Skin is warm and dry.  Neurological:     General: No focal deficit present.     Mental Status: She is alert and oriented to person, place, and time. Mental status is at baseline.  Psychiatric:        Mood and Affect: Mood normal.        Behavior: Behavior normal.      UC Treatments / Results  Labs (all labs ordered are listed, but only abnormal results are displayed) Labs Reviewed  POCT URINALYSIS DIP (MANUAL ENTRY)    EKG   Radiology No results found.  Procedures Procedures (including critical care time)  Medications Ordered in UC Medications - No data to display  Initial Impression / Assessment and Plan / UC Course  I have reviewed the triage vital signs and the nursing notes.  Pertinent labs & imaging results that were available during my care of the patient were reviewed by me and considered in my medical decision making (see chart for details).     MDM: 1.  Left flank pain-Advised  patient to go to [facility] or [facility]  for further evaluation of the left flank pain and possible CT of renal pelvis to rule out kidney stone.  Patient agreed and verbalized understanding of these instructions and this plan of care today.  Friend assisting her today will drive to ED now.  Discharged to ED hemodynamically stable. Final Clinical Impressions(s) / UC Diagnoses   Final diagnoses:  Left flank pain     Discharge Instructions      Advised patient to go to [facility] or [facility] for further evaluation of the left flank pain and possible CT of renal pelvis to rule out kidney stone.     ED Prescriptions   None    PDMP not reviewed this encounter.   Trevor Iha, FNP 10/18/23 [phone]

## 2023-10-18 NOTE — ED Notes (Signed)
Dc instructions reviewed with patient. Patient voiced understanding. Dc with belongings.

## 2023-10-18 NOTE — ED Triage Notes (Signed)
Pt reports left flank pain that woke her from sleep this morning around 2am.  Took 1000mg  tylenol at 215am today and 800mg  ibuprofen at 0900.  Denies any urinary symptoms, abdominal pain or recent injury to abdomen or back.

## 2023-10-18 NOTE — ED Triage Notes (Signed)
Pt c/o LT flank pain since 830 this morning. Denies N/V. Had appendix taken out last summer. No hx of kidney stones. Tylenol and motrin prn.

## 2023-10-19 ENCOUNTER — Other Ambulatory Visit: Payer: Self-pay

## 2023-10-19 ENCOUNTER — Other Ambulatory Visit (HOSPITAL_COMMUNITY): Payer: Self-pay

## 2023-10-22 ENCOUNTER — Other Ambulatory Visit (HOSPITAL_COMMUNITY): Payer: Self-pay

## 2023-10-23 ENCOUNTER — Other Ambulatory Visit (HOSPITAL_COMMUNITY): Payer: Self-pay

## 2023-10-24 ENCOUNTER — Other Ambulatory Visit (HOSPITAL_COMMUNITY): Payer: Self-pay

## 2023-10-25 ENCOUNTER — Other Ambulatory Visit (HOSPITAL_COMMUNITY): Payer: Self-pay

## 2023-10-26 ENCOUNTER — Other Ambulatory Visit (HOSPITAL_COMMUNITY): Payer: Self-pay

## 2023-11-08 ENCOUNTER — Other Ambulatory Visit (HOSPITAL_COMMUNITY): Payer: Self-pay

## 2023-11-27 ENCOUNTER — Other Ambulatory Visit (HOSPITAL_COMMUNITY): Payer: Self-pay

## 2023-12-06 ENCOUNTER — Other Ambulatory Visit (HOSPITAL_COMMUNITY): Payer: Self-pay

## 2023-12-20 ENCOUNTER — Other Ambulatory Visit (HOSPITAL_COMMUNITY): Payer: Self-pay

## 2023-12-20 ENCOUNTER — Other Ambulatory Visit: Payer: Self-pay | Admitting: Internal Medicine

## 2023-12-20 ENCOUNTER — Other Ambulatory Visit: Payer: Self-pay

## 2023-12-20 DIAGNOSIS — K2970 Gastritis, unspecified, without bleeding: Secondary | ICD-10-CM

## 2023-12-20 DIAGNOSIS — K219 Gastro-esophageal reflux disease without esophagitis: Secondary | ICD-10-CM

## 2023-12-21 ENCOUNTER — Other Ambulatory Visit (HOSPITAL_COMMUNITY): Payer: Self-pay

## 2023-12-22 ENCOUNTER — Other Ambulatory Visit (HOSPITAL_COMMUNITY): Payer: Self-pay

## 2023-12-26 ENCOUNTER — Other Ambulatory Visit (HOSPITAL_COMMUNITY): Payer: Self-pay

## 2023-12-26 MED ORDER — FLUCONAZOLE 150 MG PO TABS
150.0000 mg | ORAL_TABLET | Freq: Every day | ORAL | 0 refills | Status: DC
  Filled 2023-12-26: qty 2, 2d supply, fill #0

## 2023-12-28 ENCOUNTER — Other Ambulatory Visit (HOSPITAL_COMMUNITY): Payer: Self-pay

## 2023-12-28 DIAGNOSIS — Z113 Encounter for screening for infections with a predominantly sexual mode of transmission: Secondary | ICD-10-CM | POA: Diagnosis not present

## 2023-12-28 DIAGNOSIS — L732 Hidradenitis suppurativa: Secondary | ICD-10-CM | POA: Diagnosis not present

## 2023-12-28 DIAGNOSIS — Z13220 Encounter for screening for lipoid disorders: Secondary | ICD-10-CM | POA: Diagnosis not present

## 2023-12-28 DIAGNOSIS — Z130 Encounter for screening for diseases of the blood and blood-forming organs and certain disorders involving the immune mechanism: Secondary | ICD-10-CM | POA: Diagnosis not present

## 2023-12-28 DIAGNOSIS — Z01419 Encounter for gynecological examination (general) (routine) without abnormal findings: Secondary | ICD-10-CM | POA: Diagnosis not present

## 2023-12-28 DIAGNOSIS — Z1151 Encounter for screening for human papillomavirus (HPV): Secondary | ICD-10-CM | POA: Diagnosis not present

## 2023-12-28 DIAGNOSIS — Z124 Encounter for screening for malignant neoplasm of cervix: Secondary | ICD-10-CM | POA: Diagnosis not present

## 2023-12-28 DIAGNOSIS — N898 Other specified noninflammatory disorders of vagina: Secondary | ICD-10-CM | POA: Diagnosis not present

## 2023-12-28 DIAGNOSIS — Z131 Encounter for screening for diabetes mellitus: Secondary | ICD-10-CM | POA: Diagnosis not present

## 2023-12-28 DIAGNOSIS — N912 Amenorrhea, unspecified: Secondary | ICD-10-CM | POA: Diagnosis not present

## 2023-12-28 MED ORDER — MEDROXYPROGESTERONE ACETATE 10 MG PO TABS
10.0000 mg | ORAL_TABLET | Freq: Every day | ORAL | 3 refills | Status: DC
  Filled 2023-12-28: qty 10, 10d supply, fill #0

## 2023-12-28 MED ORDER — CLOBETASOL PROPIONATE 0.05 % EX CREA
1.0000 | TOPICAL_CREAM | Freq: Two times a day (BID) | CUTANEOUS | 2 refills | Status: DC | PRN
Start: 2023-12-28 — End: 2024-03-20
  Filled 2023-12-28: qty 15, 15d supply, fill #0
  Filled 2024-01-16: qty 15, 15d supply, fill #1
  Filled 2024-01-30: qty 15, 7d supply, fill #1
  Filled 2024-02-14: qty 15, 7d supply, fill #2

## 2023-12-28 MED ORDER — FLUCONAZOLE 150 MG PO TABS
150.0000 mg | ORAL_TABLET | Freq: Every day | ORAL | 1 refills | Status: AC
Start: 2023-12-28 — End: ?
  Filled 2023-12-28: qty 2, 2d supply, fill #0
  Filled 2024-01-16 – 2024-01-30 (×2): qty 2, 2d supply, fill #1

## 2023-12-28 MED ORDER — DOXYCYCLINE MONOHYDRATE 100 MG PO CAPS
100.0000 mg | ORAL_CAPSULE | Freq: Two times a day (BID) | ORAL | 0 refills | Status: AC
Start: 2023-12-28 — End: ?
  Filled 2023-12-28: qty 14, 7d supply, fill #0

## 2023-12-31 ENCOUNTER — Other Ambulatory Visit: Payer: Self-pay | Admitting: Nurse Practitioner

## 2023-12-31 DIAGNOSIS — Z1231 Encounter for screening mammogram for malignant neoplasm of breast: Secondary | ICD-10-CM

## 2024-01-01 ENCOUNTER — Other Ambulatory Visit (HOSPITAL_COMMUNITY): Payer: Self-pay

## 2024-01-01 ENCOUNTER — Encounter (HOSPITAL_COMMUNITY): Payer: Self-pay

## 2024-01-03 ENCOUNTER — Encounter

## 2024-01-03 DIAGNOSIS — Z1231 Encounter for screening mammogram for malignant neoplasm of breast: Secondary | ICD-10-CM

## 2024-01-07 ENCOUNTER — Ambulatory Visit
Admission: RE | Admit: 2024-01-07 | Discharge: 2024-01-07 | Discharge status: Home / Self Care | Source: Ambulatory Visit | Attending: Nurse Practitioner | Admitting: Nurse Practitioner

## 2024-01-07 DIAGNOSIS — Z1231 Encounter for screening mammogram for malignant neoplasm of breast: Secondary | ICD-10-CM | POA: Diagnosis not present

## 2024-01-11 ENCOUNTER — Other Ambulatory Visit (HOSPITAL_COMMUNITY): Payer: Self-pay

## 2024-01-11 MED ORDER — ALPRAZOLAM 1 MG PO TABS
1.0000 mg | ORAL_TABLET | Freq: Every morning | ORAL | 0 refills | Status: DC
  Filled 2024-01-16: qty 30, 30d supply, fill #0
  Filled 2024-01-21: qty 90, 90d supply, fill #0

## 2024-01-11 MED ORDER — ALPRAZOLAM 2 MG PO TABS
2.0000 mg | ORAL_TABLET | Freq: Every day | ORAL | 0 refills | Status: AC
Start: 2024-01-11 — End: ?
  Filled 2024-01-16: qty 30, 30d supply, fill #0
  Filled 2024-01-21: qty 90, 90d supply, fill #0

## 2024-01-14 ENCOUNTER — Other Ambulatory Visit (HOSPITAL_COMMUNITY): Payer: Self-pay

## 2024-01-15 ENCOUNTER — Other Ambulatory Visit (HOSPITAL_COMMUNITY): Payer: Self-pay

## 2024-01-16 ENCOUNTER — Other Ambulatory Visit: Payer: Self-pay

## 2024-01-16 ENCOUNTER — Other Ambulatory Visit (HOSPITAL_COMMUNITY): Payer: Self-pay

## 2024-01-17 ENCOUNTER — Other Ambulatory Visit (HOSPITAL_COMMUNITY): Payer: Self-pay

## 2024-01-18 ENCOUNTER — Other Ambulatory Visit (HOSPITAL_COMMUNITY): Payer: Self-pay

## 2024-01-21 ENCOUNTER — Other Ambulatory Visit (HOSPITAL_COMMUNITY): Payer: Self-pay

## 2024-01-21 ENCOUNTER — Other Ambulatory Visit: Payer: Self-pay

## 2024-01-22 ENCOUNTER — Other Ambulatory Visit (HOSPITAL_COMMUNITY): Payer: Self-pay

## 2024-01-22 MED ORDER — LIDOCAINE 5 % EX OINT
1.0000 | TOPICAL_OINTMENT | Freq: Three times a day (TID) | CUTANEOUS | 2 refills | Status: AC | PRN
  Filled 2024-01-22: qty 50, 17d supply, fill #0
  Filled 2024-04-14: qty 50, 17d supply, fill #1

## 2024-01-24 ENCOUNTER — Other Ambulatory Visit (HOSPITAL_COMMUNITY): Payer: Self-pay

## 2024-01-28 ENCOUNTER — Other Ambulatory Visit (HOSPITAL_COMMUNITY): Payer: Self-pay

## 2024-01-30 ENCOUNTER — Other Ambulatory Visit (HOSPITAL_COMMUNITY): Payer: Self-pay

## 2024-02-08 ENCOUNTER — Other Ambulatory Visit: Payer: Self-pay

## 2024-02-08 DIAGNOSIS — Z113 Encounter for screening for infections with a predominantly sexual mode of transmission: Secondary | ICD-10-CM | POA: Diagnosis not present

## 2024-02-13 ENCOUNTER — Other Ambulatory Visit: Payer: Self-pay

## 2024-02-14 ENCOUNTER — Other Ambulatory Visit: Payer: Self-pay

## 2024-02-14 ENCOUNTER — Other Ambulatory Visit (HOSPITAL_COMMUNITY): Payer: Self-pay

## 2024-02-18 ENCOUNTER — Other Ambulatory Visit (HOSPITAL_COMMUNITY): Payer: Self-pay

## 2024-02-19 ENCOUNTER — Other Ambulatory Visit (HOSPITAL_COMMUNITY): Payer: Self-pay

## 2024-02-19 ENCOUNTER — Other Ambulatory Visit (HOSPITAL_BASED_OUTPATIENT_CLINIC_OR_DEPARTMENT_OTHER): Payer: Self-pay

## 2024-02-19 MED ORDER — AMITRIPTYLINE HCL 75 MG PO TABS
75.0000 mg | ORAL_TABLET | Freq: Every day | ORAL | 1 refills | Status: DC
  Filled 2024-02-19: qty 30, 30d supply, fill #0
  Filled 2024-03-03: qty 30, 30d supply, fill #1

## 2024-02-29 ENCOUNTER — Other Ambulatory Visit (HOSPITAL_COMMUNITY): Payer: Self-pay

## 2024-03-03 ENCOUNTER — Other Ambulatory Visit (HOSPITAL_COMMUNITY): Payer: Self-pay

## 2024-03-03 ENCOUNTER — Other Ambulatory Visit: Payer: Self-pay

## 2024-03-03 DIAGNOSIS — H524 Presbyopia: Secondary | ICD-10-CM | POA: Diagnosis not present

## 2024-03-03 DIAGNOSIS — H5203 Hypermetropia, bilateral: Secondary | ICD-10-CM | POA: Diagnosis not present

## 2024-03-04 ENCOUNTER — Ambulatory Visit

## 2024-03-04 ENCOUNTER — Ambulatory Visit
Admission: RE | Admit: 2024-03-04 | Discharge: 2024-03-04 | Disposition: A | Discharge status: Home / Self Care | Source: Ambulatory Visit | Attending: Family Medicine | Admitting: Family Medicine

## 2024-03-04 ENCOUNTER — Other Ambulatory Visit (HOSPITAL_COMMUNITY): Payer: Self-pay

## 2024-03-04 ENCOUNTER — Other Ambulatory Visit: Payer: Self-pay

## 2024-03-04 VITALS — BP 113/69 | HR 121 | Temp 98.3°F | Resp 16

## 2024-03-04 DIAGNOSIS — W44F3XA Food entering into or through a natural orifice, initial encounter: Secondary | ICD-10-CM | POA: Diagnosis not present

## 2024-03-04 DIAGNOSIS — R0789 Other chest pain: Secondary | ICD-10-CM

## 2024-03-04 DIAGNOSIS — S99912A Unspecified injury of left ankle, initial encounter: Secondary | ICD-10-CM | POA: Diagnosis not present

## 2024-03-04 DIAGNOSIS — T17320A Food in larynx causing asphyxiation, initial encounter: Secondary | ICD-10-CM | POA: Diagnosis not present

## 2024-03-04 DIAGNOSIS — R079 Chest pain, unspecified: Secondary | ICD-10-CM | POA: Diagnosis not present

## 2024-03-04 DIAGNOSIS — W109XXA Fall (on) (from) unspecified stairs and steps, initial encounter: Secondary | ICD-10-CM | POA: Diagnosis not present

## 2024-03-04 DIAGNOSIS — M25572 Pain in left ankle and joints of left foot: Secondary | ICD-10-CM | POA: Diagnosis not present

## 2024-03-04 NOTE — ED Provider Notes (Signed)
 [name]    CSN: [id number] Arrival date & time: 03/04/24  0835      History   Chief Complaint Chief Complaint  Patient presents with   Sore Throat    Entered by patient    HPI Christina [name] is a 43 y.o. female.   Patient states she has acid reflux.  Difficult to control.  Has been on multiple medications.  She states at night she sometimes has acid reflux up into her mouth and chest.  She states that she woke up with gagging and vomiting the night before last.  She coughed and coughed and had trouble clearing the aspirate from her chest.  She has a burning in her throat.  Raspy voice.  Burning in her chest.  She called her PCP and they recommended she come in for a chest x-ray.  She states that the coughing is improved today. She also indicates she lives in a townhouse and running to get to the bathroom when she was having her vomiting she twisted her ankle on the stairs.  Still has left ankle swelling and pain.  Would like this checked as well    Past Medical History:  Diagnosis Date   Abnormal Pap smear    BV (bacterial vaginosis)    GERD (gastroesophageal reflux disease)    Migraine     Patient Active Problem List   Diagnosis Date Noted   Migraine with aura 12/04/2012   Cephalgia 12/04/2012    Past Surgical History:  Procedure Laterality Date   APPENDECTOMY     CESAREAN SECTION     DENTAL SURGERY     LAPAROSCOPIC APPENDECTOMY N/A 02/25/2023   Procedure: APPENDECTOMY LAPAROSCOPIC;  Surgeon: Enid Harry, MD;  Location: [facility];  Service: General;  Laterality: N/A;    OB History     Gravida  4   Para  2   Term  2   Preterm      AB  2   Living  2      SAB  2   IAB      Ectopic      Multiple      Live Births               Home Medications    Prior to Admission medications   Medication Sig Start Date End Date Taking? Authorizing Provider  acyclovir  (ZOVIRAX ) 800 MG tablet Take 1 tablet (800 mg total) by mouth 2  (two) times daily. 08/31/23     ALPRAZolam  (XANAX ) 1 MG tablet Take 1 tablet (1 mg total) by mouth in the morning. 01/11/24     alprazolam  (XANAX ) 2 MG tablet Take 1 tablet (2 mg total) by mouth nightly at bedtime 07/11/22     amitriptyline  (ELAVIL ) 75 MG tablet Take 1 tablet (75 mg total) by mouth at bedtime. 07/09/17   Tommas Fragmin A, FNP  clindamycin  (CLEOCIN  T) 1 % external solution Apply 1 Application topically 2 (two) times daily. 07/23/23     Clindamycin  Phosphate (XACIATO ) 2 % GEL Insert 1 applicatorful vaginally once a week for 12 weeks 10/19/22     clobetasol  cream (TEMOVATE ) 0.05 % Apply to skin 2 (two) times daily for 14 days.  Then use up to twice a day as needed for itching. 12/28/23     dexlansoprazole  (DEXILANT ) 60 MG capsule Take 1 capsule (60 mg total) by mouth daily as directed. 03/16/23     Ferric Maltol  (ACCRUFER ) 30 MG CAPS Take 1 capsule (  30 mg total) by mouth 2 (two) times daily as directed. 10/05/23     fluconazole  (DIFLUCAN ) 150 MG tablet Take 1 tablet (150 mg total) by mouth daily for 2 days 01/08/23     gabapentin  (NEURONTIN ) 300 MG capsule Take 1 capsule (300 mg total) by mouth 2 (two) times daily. 02/03/23   Coretha Dew, PA-C  hydrocortisone  (ANUSOL -HC) 2.5 % rectal cream Apply 1 application topically to the affected area(s) daily. 02/15/21     lidocaine  (XYLOCAINE ) 5 % ointment Apply topically 3 (three) times daily as needed. 01/22/24     linaclotide  (LINZESS ) 72 MCG capsule Take 1 capsule (72 mcg total) by mouth daily as directed. 07/11/22     medroxyPROGESTERone  (PROVERA ) 10 MG tablet Take 1 tablet (10 mg total) by mouth with food at bedtime. 08/09/23     metoprolol  tartrate (LOPRESSOR ) 50 MG tablet Take 1 tablet (50 mg total) by mouth 2 (two) times daily as directed 03/18/23     metroNIDAZOLE  (METROGEL ) 0.75 % vaginal gel Place 1 Applicatorful vaginally every week for 90 days 09/27/22     neomycin -polymyxin-hydrocortisone  (CORTISPORIN ) 3.5-10000-1 OTIC suspension Place 4  drops into affected ear(s) 3 (three) times daily. 10/05/23     nitroGLYCERIN  (NITROSTAT ) 0.4 MG SL tablet Dissolve 1 tablet under the tongue as needed for chest pain.  If no relief call 911. 03/18/23     nystatin  (MYCOSTATIN ) 100000 UNIT/ML suspension Swish and spit 5 mLs (500,000 Units total) by mouth 3 (three) times daily. 09/18/23     propranolol  (INDERAL ) 10 MG tablet Take 1 tablet (10 mg total) by mouth 3 (three) times daily as needed. 09/26/22   Custovic, Sabina, DO  Vitamin D , Ergocalciferol , (DRISDOL ) 1.25 MG (50000 UNIT) CAPS capsule Take 1 capsule (50,000 Units total) by mouth once a week as directed. 03/16/23       Family History Family History  Problem Relation Age of Onset   Arrhythmia Mother    Hypertension Mother    Stroke Mother    Heart attack Mother    Cancer Father        brain and lung   Hypertension Father    Migraines Father    Colon cancer Neg Hx    Stomach cancer Neg Hx     Social History Social History   Tobacco Use   Smoking status: Never   Smokeless tobacco: Never  Vaping Use   Vaping status: Never Used  Substance Use Topics   Alcohol use: No   Drug use: No     Allergies   Patient has no known allergies.   Review of Systems Review of Systems See HPI  Physical Exam Triage Vital Signs ED Triage Vitals  Encounter Vitals Group     BP 03/04/24 0852 113/69     Girls Systolic BP Percentile --      Girls Diastolic BP Percentile --      Boys Systolic BP Percentile --      Boys Diastolic BP Percentile --      Pulse Rate 03/04/24 0852 (!) 121     Resp 03/04/24 0852 16     Temp 03/04/24 0852 98.3 F (36.8 C)     Temp src --      SpO2 03/04/24 0852 97 %     Weight --      Height --      Head Circumference --      Peak Flow --      Pain Score 03/04/24 0855 8  Pain Loc --      Pain Education --      Exclude from Growth Chart --    No data found.  Updated Vital Signs BP 113/69   Pulse (!) 121   Temp 98.3 F (36.8 C)   Resp 16   SpO2  97%       Physical Exam Constitutional:      General: She is not in acute distress.    Appearance: She is well-developed and normal weight.     Comments: Voice is hoarse  HENT:     Head: Normocephalic and atraumatic.     Mouth/Throat:     Mouth: Mucous membranes are moist.     Pharynx: Uvula midline. Posterior oropharyngeal erythema present. No pharyngeal swelling.     Tonsils: No tonsillar exudate. 0 on the right. 0 on the left.   Eyes:     Conjunctiva/sclera: Conjunctivae normal.     Pupils: Pupils are equal, round, and reactive to light.    Cardiovascular:     Rate and Rhythm: Regular rhythm. Tachycardia present.     Heart sounds: Normal heart sounds.  Pulmonary:     Effort: Pulmonary effort is normal. No respiratory distress.     Breath sounds: Normal breath sounds.  Abdominal:     General: There is no distension.     Palpations: Abdomen is soft.   Musculoskeletal:        General: Normal range of motion.     Cervical back: Normal range of motion.     Left ankle: Swelling present. Tenderness present over the lateral malleolus and ATF ligament.  Lymphadenopathy:     Cervical: No cervical adenopathy.   Skin:    General: Skin is warm and dry.   Neurological:     Mental Status: She is alert.      UC Treatments / Results  Labs (all labs ordered are listed, but only abnormal results are displayed) Labs Reviewed - No data to display  EKG   Radiology DG Chest 2 View Result Date: 03/04/2024 CLINICAL DATA:  Marvell Slider.  Pain EXAM: CHEST - 2 VIEW COMPARISON:  Chest x-ray 03/13/2022 FINDINGS: The heart size and mediastinal contours are within normal limits. No consolidation, pneumothorax or effusion. No edema. The visualized skeletal structures are unremarkable. Jewelry along both breast. Degenerative changes of the spine. Curvature of the spine. IMPRESSION: No acute cardiopulmonary disease. Electronically Signed   By: Adrianna Horde M.D.   On: 03/04/2024 09:57     Procedures Procedures (including critical care time)  Medications Ordered in UC Medications - No data to display  Initial Impression / Assessment and Plan / UC Course  I have reviewed the triage vital signs and the nursing notes.  Pertinent labs & imaging results that were available during my care of the patient were reviewed by me and considered in my medical decision making (see chart for details).     Throat is slightly inflamed from the acid reflux.  I told patient this will improve over time with conservative measures.  lungs are clear.  Chest x-ray is negative Ankle has mild swelling of the lateral malleolus and tenderness over the ATFL.  X-rays are negative. Final Clinical Impressions(s) / UC Diagnoses   Final diagnoses:  Choking due to food (regurgitated), initial encounter  Fall on stairs, initial encounter     Discharge Instructions      Continue your acid reflux medications Avoid stomach irritants like alcohol, aspirin , ibuprofen  Elevate head of bed May  use salt water gargles or Chloraseptic spray for your throat pain See your primary care doctor for follow-up     ED Prescriptions   None    PDMP not reviewed this encounter.   Stephany Ehrich, MD 03/04/24 [id number]

## 2024-03-04 NOTE — ED Triage Notes (Addendum)
 Sore throat since Sunday. Has acid reflux, thinks she aspirated vomit in her sleep and has sore throat from acid reflux. Reports difficulty swallowing. Has c/o left foot pain, twisted ankle getting up in the night, wants xray.

## 2024-03-04 NOTE — Discharge Instructions (Signed)
 Continue your acid reflux medications Avoid stomach irritants like alcohol, aspirin , ibuprofen  Elevate head of bed May use salt water gargles or Chloraseptic spray for your throat pain See your primary care doctor for follow-up

## 2024-03-05 ENCOUNTER — Ambulatory Visit (HOSPITAL_COMMUNITY): Payer: Self-pay

## 2024-03-06 ENCOUNTER — Other Ambulatory Visit (HOSPITAL_COMMUNITY): Payer: Self-pay

## 2024-03-06 ENCOUNTER — Ambulatory Visit

## 2024-03-10 ENCOUNTER — Other Ambulatory Visit (HOSPITAL_COMMUNITY): Payer: Self-pay

## 2024-03-13 ENCOUNTER — Other Ambulatory Visit (HOSPITAL_COMMUNITY): Payer: Self-pay

## 2024-03-14 ENCOUNTER — Other Ambulatory Visit: Payer: Self-pay

## 2024-03-14 ENCOUNTER — Other Ambulatory Visit (HOSPITAL_COMMUNITY): Payer: Self-pay

## 2024-03-14 MED ORDER — VOQUEZNA 10 MG PO TABS
10.0000 mg | ORAL_TABLET | Freq: Every day | ORAL | 0 refills | Status: AC
  Filled 2024-03-14: qty 30, 30d supply, fill #0
  Filled 2024-04-28: qty 30, 30d supply, fill #1
  Filled 2024-06-01 – 2024-06-19 (×2): qty 30, 30d supply, fill #2

## 2024-03-15 ENCOUNTER — Other Ambulatory Visit (HOSPITAL_COMMUNITY): Payer: Self-pay

## 2024-03-17 ENCOUNTER — Other Ambulatory Visit (HOSPITAL_BASED_OUTPATIENT_CLINIC_OR_DEPARTMENT_OTHER): Payer: Self-pay

## 2024-03-17 ENCOUNTER — Other Ambulatory Visit: Payer: Self-pay

## 2024-03-17 ENCOUNTER — Other Ambulatory Visit (HOSPITAL_COMMUNITY): Payer: Self-pay

## 2024-03-17 DIAGNOSIS — I10 Essential (primary) hypertension: Secondary | ICD-10-CM | POA: Diagnosis not present

## 2024-03-17 DIAGNOSIS — K219 Gastro-esophageal reflux disease without esophagitis: Secondary | ICD-10-CM | POA: Diagnosis not present

## 2024-03-17 DIAGNOSIS — Z6830 Body mass index (BMI) 30.0-30.9, adult: Secondary | ICD-10-CM | POA: Diagnosis not present

## 2024-03-17 MED ORDER — TRIAMCINOLONE ACETONIDE 0.025 % EX OINT
1.0000 | TOPICAL_OINTMENT | Freq: Two times a day (BID) | CUTANEOUS | 3 refills | Status: AC
  Filled 2024-03-17: qty 454, 30d supply, fill #0
  Filled 2024-04-14: qty 60, 30d supply, fill #1
  Filled 2024-05-22: qty 454, 30d supply, fill #2
  Filled 2024-06-06 – 2024-06-19 (×2): qty 454, 30d supply, fill #3
  Filled 2024-07-17: qty 454, 30d supply, fill #4

## 2024-03-17 MED ORDER — KETOCONAZOLE 2 % EX CREA
1.0000 | TOPICAL_CREAM | Freq: Every day | CUTANEOUS | 3 refills | Status: AC
  Filled 2024-03-17: qty 30, 30d supply, fill #0
  Filled 2024-04-14: qty 30, 30d supply, fill #1
  Filled 2024-07-17: qty 30, 30d supply, fill #2

## 2024-03-17 MED ORDER — DEXLANSOPRAZOLE 60 MG PO CPDR
60.0000 mg | DELAYED_RELEASE_CAPSULE | Freq: Every day | ORAL | 5 refills | Status: AC
  Filled 2024-03-17: qty 30, 30d supply, fill #0
  Filled 2024-09-23: qty 30, 30d supply, fill #1

## 2024-03-18 ENCOUNTER — Other Ambulatory Visit (HOSPITAL_COMMUNITY): Payer: Self-pay

## 2024-03-18 MED ORDER — VITAMIN D (ERGOCALCIFEROL) 50000 UNITS PO CAPS
50000.0000 [IU] | ORAL_CAPSULE | ORAL | 3 refills | Status: AC
  Filled 2024-03-18: qty 4, 28d supply, fill #0
  Filled 2024-04-18 – 2024-09-23 (×6): qty 4, 28d supply, fill #1

## 2024-03-18 MED ORDER — DEXLANSOPRAZOLE 60 MG PO CPDR
60.0000 mg | DELAYED_RELEASE_CAPSULE | Freq: Every day | ORAL | 5 refills | Status: AC
  Filled 2024-03-18: qty 30, 30d supply, fill #0

## 2024-03-18 MED ORDER — PROPRANOLOL HCL 10 MG PO TABS
10.0000 mg | ORAL_TABLET | Freq: Three times a day (TID) | ORAL | 1 refills | Status: AC
  Filled 2024-03-18: qty 90, 30d supply, fill #0
  Filled 2024-06-26 – 2024-07-17 (×2): qty 90, 30d supply, fill #1
  Filled 2024-09-23: qty 90, 30d supply, fill #2

## 2024-03-18 MED ORDER — LINZESS 72 MCG PO CAPS
72.0000 ug | ORAL_CAPSULE | Freq: Every day | ORAL | 2 refills | Status: AC
  Filled 2024-03-18: qty 30, 30d supply, fill #0
  Filled 2024-04-14: qty 30, 30d supply, fill #1
  Filled 2024-06-26 – 2024-07-17 (×2): qty 30, 30d supply, fill #2
  Filled 2024-09-23: qty 30, 30d supply, fill #3

## 2024-03-18 MED ORDER — ACYCLOVIR 800 MG PO TABS
800.0000 mg | ORAL_TABLET | Freq: Two times a day (BID) | ORAL | 3 refills | Status: AC
  Filled 2024-03-18: qty 60, 30d supply, fill #0

## 2024-03-19 ENCOUNTER — Other Ambulatory Visit (HOSPITAL_COMMUNITY): Payer: Self-pay

## 2024-03-19 MED ORDER — CLINDAMYCIN PHOSPHATE 1 % EX SOLN
CUTANEOUS | 5 refills | Status: AC
  Filled 2024-03-19: qty 60, 30d supply, fill #0
  Filled 2024-04-28 – 2024-07-31 (×3): qty 60, 30d supply, fill #1
  Filled 2024-09-23: qty 60, 30d supply, fill #2

## 2024-03-19 MED ORDER — DEXLANSOPRAZOLE 60 MG PO CPDR
60.0000 mg | DELAYED_RELEASE_CAPSULE | Freq: Every day | ORAL | 5 refills | Status: AC
  Filled 2024-03-19 – 2024-04-14 (×3): qty 30, 30d supply, fill #0
  Filled 2024-06-19: qty 30, 30d supply, fill #1
  Filled 2024-07-31: qty 30, 30d supply, fill #2

## 2024-03-19 MED ORDER — AMITRIPTYLINE HCL 75 MG PO TABS
ORAL_TABLET | ORAL | 1 refills | Status: AC
  Filled 2024-03-19: qty 30, 30d supply, fill #0
  Filled 2024-04-14: qty 30, 30d supply, fill #1
  Filled 2024-05-16: qty 30, 30d supply, fill #2
  Filled 2024-06-19: qty 30, 30d supply, fill #3
  Filled 2024-07-31 – 2024-09-23 (×2): qty 30, 30d supply, fill #4

## 2024-03-19 MED ORDER — GABAPENTIN 300 MG PO CAPS
ORAL_CAPSULE | ORAL | 1 refills | Status: AC
  Filled 2024-03-19: qty 60, 30d supply, fill #0
  Filled 2024-04-14: qty 60, 30d supply, fill #1
  Filled 2024-06-19: qty 60, 30d supply, fill #2
  Filled 2024-09-23: qty 60, 30d supply, fill #3

## 2024-03-19 MED ORDER — ALPRAZOLAM 2 MG PO TABS
2.0000 mg | ORAL_TABLET | Freq: Every day | ORAL | 0 refills | Status: AC
  Filled 2024-03-19 – 2024-04-18 (×2): qty 90, 90d supply, fill #0
  Filled 2024-05-30: qty 30, 30d supply, fill #0

## 2024-03-19 MED ORDER — ALPRAZOLAM 1 MG PO TABS
1.0000 mg | ORAL_TABLET | Freq: Every morning | ORAL | 0 refills | Status: AC
  Filled 2024-03-19 – 2024-04-18 (×2): qty 90, 90d supply, fill #0
  Filled 2024-05-30: qty 30, 30d supply, fill #0

## 2024-03-20 ENCOUNTER — Other Ambulatory Visit (HOSPITAL_COMMUNITY): Payer: Self-pay

## 2024-03-20 ENCOUNTER — Other Ambulatory Visit: Payer: Self-pay

## 2024-03-20 MED ORDER — CLOBETASOL PROPIONATE 0.05 % EX CREA
TOPICAL_CREAM | CUTANEOUS | 2 refills | Status: DC
  Filled 2024-03-20: qty 15, 30d supply, fill #0
  Filled 2024-04-14: qty 15, 30d supply, fill #1
  Filled 2024-06-06: qty 15, 30d supply, fill #2

## 2024-03-24 ENCOUNTER — Other Ambulatory Visit (HOSPITAL_COMMUNITY): Payer: Self-pay

## 2024-03-25 ENCOUNTER — Other Ambulatory Visit (HOSPITAL_COMMUNITY): Payer: Self-pay

## 2024-03-26 ENCOUNTER — Other Ambulatory Visit (HOSPITAL_COMMUNITY): Payer: Self-pay

## 2024-03-26 MED ORDER — ONDANSETRON 4 MG PO TBDP
4.0000 mg | ORAL_TABLET | Freq: Three times a day (TID) | ORAL | 1 refills | Status: AC | PRN
  Filled 2024-03-26 – 2024-04-14 (×2): qty 30, 10d supply, fill #0
  Filled 2024-06-19: qty 30, 10d supply, fill #1

## 2024-03-27 ENCOUNTER — Other Ambulatory Visit (HOSPITAL_COMMUNITY): Payer: Self-pay

## 2024-03-28 ENCOUNTER — Other Ambulatory Visit (HOSPITAL_COMMUNITY): Payer: Self-pay

## 2024-03-28 MED ORDER — XACIATO 2 % VA GEL
1.0000 | VAGINAL | 3 refills | Status: AC
  Filled 2024-03-28: qty 32, 28d supply, fill #0
  Filled 2024-05-06: qty 8, 30d supply, fill #0
  Filled 2024-05-11: qty 56, 28d supply, fill #0

## 2024-03-31 ENCOUNTER — Other Ambulatory Visit: Payer: Self-pay

## 2024-03-31 ENCOUNTER — Other Ambulatory Visit (HOSPITAL_COMMUNITY): Payer: Self-pay

## 2024-04-02 ENCOUNTER — Other Ambulatory Visit (HOSPITAL_COMMUNITY): Payer: Self-pay

## 2024-04-02 MED ORDER — METRONIDAZOLE 0.75 % VA GEL
1.0000 | VAGINAL | 3 refills | Status: AC
  Filled 2024-04-02: qty 70, 30d supply, fill #0
  Filled 2024-04-14: qty 70, 28d supply, fill #0
  Filled 2024-05-11: qty 70, 28d supply, fill #1
  Filled 2024-06-26 – 2024-10-17 (×4): qty 70, 30d supply, fill #2

## 2024-04-03 ENCOUNTER — Telehealth: Payer: Self-pay | Admitting: *Deleted

## 2024-04-03 DIAGNOSIS — M779 Enthesopathy, unspecified: Secondary | ICD-10-CM

## 2024-04-03 NOTE — Telephone Encounter (Signed)
 I attempted to call the patient.  I left her a message informing her that we need xrays of her feet and ankles at her appointment on tomorrow.  I asked her to come in a few minutes.

## 2024-04-04 ENCOUNTER — Encounter: Payer: Self-pay | Admitting: Podiatry

## 2024-04-04 ENCOUNTER — Ambulatory Visit

## 2024-04-04 ENCOUNTER — Ambulatory Visit: Admitting: Podiatry

## 2024-04-04 ENCOUNTER — Other Ambulatory Visit (HOSPITAL_COMMUNITY): Payer: Self-pay

## 2024-04-04 ENCOUNTER — Other Ambulatory Visit: Payer: Self-pay | Admitting: Podiatry

## 2024-04-04 DIAGNOSIS — M21611 Bunion of right foot: Secondary | ICD-10-CM

## 2024-04-04 DIAGNOSIS — G8929 Other chronic pain: Secondary | ICD-10-CM | POA: Diagnosis not present

## 2024-04-04 DIAGNOSIS — M21612 Bunion of left foot: Secondary | ICD-10-CM

## 2024-04-04 DIAGNOSIS — M779 Enthesopathy, unspecified: Secondary | ICD-10-CM

## 2024-04-04 DIAGNOSIS — M79671 Pain in right foot: Secondary | ICD-10-CM | POA: Diagnosis not present

## 2024-04-04 DIAGNOSIS — M25571 Pain in right ankle and joints of right foot: Secondary | ICD-10-CM | POA: Diagnosis not present

## 2024-04-04 DIAGNOSIS — M722 Plantar fascial fibromatosis: Secondary | ICD-10-CM | POA: Diagnosis not present

## 2024-04-04 DIAGNOSIS — M79672 Pain in left foot: Secondary | ICD-10-CM | POA: Diagnosis not present

## 2024-04-04 MED ORDER — MELOXICAM 15 MG PO TABS
15.0000 mg | ORAL_TABLET | Freq: Every day | ORAL | 0 refills | Status: AC
  Filled 2024-04-04 – 2024-04-14 (×2): qty 30, 30d supply, fill #0

## 2024-04-04 NOTE — Patient Instructions (Signed)

## 2024-04-04 NOTE — Progress Notes (Signed)
  Subjective:  Patient ID: Christina [name], female    DOB: 04/17/1981,   MRN: [id number]  Chief Complaint  Patient presents with   Foot Pain    I have pain on both feet.  It hurts from my heel up to the middle part of my feet.    43 y.o. female presents for concern of bilateral heel pain that has been ongoing for about a year. 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 States most pain is after rest. She does get pain in her heel and into her arch after working all day. She has seen PCP and advised to wear good shoes and using frozen water bottle. She denies any other treatments . Denies any other pedal complaints. Denies n/v/f/c.   Past Medical History:  Diagnosis Date   Abnormal Pap smear    BV (bacterial vaginosis)    GERD (gastroesophageal reflux disease)    Migraine     Objective:  Physical Exam: Vascular: DP/PT pulses 2/4 bilateral. CFT <3 seconds. Normal hair growth on digits. No edema.  Skin. No lacerations or abrasions bilateral feet.  Musculoskeletal: MMT 5/5 bilateral lower extremities in DF, PF, Inversion and Eversion. Deceased ROM in DF of ankle joint. Bilateral mild HAV deformity noted. Tender to the medial calcaneal tubercle bilaterally . No pain with achilles, PT or arch. No pain with calcaneal squeeze.  Neurological: Sensation intact to light touch.   Assessment:   1. Plantar fasciitis, bilateral   2. Bilateral bunions      Plan:  Patient was evaluated and treated and all questions answered. Discussed plantar fasciitis with patient.  X-rays reviewed and discussed with patient. No acute fractures or dislocations noted. Mild spurring noted at inferior calcaneus. Bilateral mild HAV deformities noted.  Discussed treatment options including, ice, NSAIDS, supportive shoes, bracing, and stretching. Stretching exercises provided to be done on a daily basis.   Prescription for meloxicam  provided and sent to pharmacy. PF brace dispensed.  -Discussed HAV and  treatment options;conservative and surgical management; risks, benefits, alternatives discussed. All patient's questions answered. -Discussed padding and wide shoe gear.   -Recommend continue with good supportive shoes and inserts.  -Discussed surgical options.  Follow-up 6 weeks or sooner if any problems arise. In the meantime, encouraged to call the office with any questions, concerns, change in symptoms.     Asberry Failing, DPM

## 2024-04-07 ENCOUNTER — Other Ambulatory Visit (HOSPITAL_COMMUNITY): Payer: Self-pay

## 2024-04-07 ENCOUNTER — Encounter: Payer: Self-pay | Admitting: Podiatry

## 2024-04-08 ENCOUNTER — Other Ambulatory Visit (HOSPITAL_COMMUNITY): Payer: Self-pay

## 2024-04-10 ENCOUNTER — Other Ambulatory Visit (HOSPITAL_COMMUNITY): Payer: Self-pay

## 2024-04-14 ENCOUNTER — Other Ambulatory Visit (HOSPITAL_BASED_OUTPATIENT_CLINIC_OR_DEPARTMENT_OTHER): Payer: Self-pay

## 2024-04-14 ENCOUNTER — Other Ambulatory Visit: Payer: Self-pay

## 2024-04-14 ENCOUNTER — Other Ambulatory Visit (HOSPITAL_COMMUNITY): Payer: Self-pay

## 2024-04-14 MED ORDER — TRAMADOL HCL 50 MG PO TABS
50.0000 mg | ORAL_TABLET | Freq: Two times a day (BID) | ORAL | 3 refills | Status: DC | PRN
  Filled 2024-04-14: qty 10, 5d supply, fill #0

## 2024-04-14 MED ORDER — LINZESS 72 MCG PO CAPS
72.0000 ug | ORAL_CAPSULE | Freq: Every day | ORAL | 2 refills | Status: AC
  Filled 2024-04-14 – 2024-05-11 (×2): qty 30, 30d supply, fill #0
  Filled 2024-06-06 – 2024-10-17 (×5): qty 30, 30d supply, fill #1

## 2024-04-14 MED ORDER — ALPRAZOLAM 1 MG PO TABS
1.0000 mg | ORAL_TABLET | Freq: Every morning | ORAL | 0 refills | Status: AC
  Filled 2024-04-18: qty 90, 90d supply, fill #0
  Filled 2024-04-28: qty 30, 30d supply, fill #0
  Filled 2024-06-27 (×2): qty 30, 30d supply, fill #1

## 2024-04-14 MED ORDER — PROPRANOLOL HCL 10 MG PO TABS
10.0000 mg | ORAL_TABLET | Freq: Three times a day (TID) | ORAL | 1 refills | Status: AC
  Filled 2024-04-14: qty 90, 30d supply, fill #0
  Filled 2024-09-23 – 2024-10-12 (×3): qty 90, 30d supply, fill #1

## 2024-04-14 MED ORDER — HYDROCORTISONE (PERIANAL) 2.5 % EX CREA
1.0000 | TOPICAL_CREAM | CUTANEOUS | 3 refills | Status: AC
  Filled 2024-04-14: qty 90, 20d supply, fill #0
  Filled 2024-05-06: qty 30, 15d supply, fill #1
  Filled 2024-06-01 – 2024-06-19 (×2): qty 30, 20d supply, fill #1
  Filled 2024-07-03 – 2024-07-17 (×2): qty 30, 20d supply, fill #2
  Filled 2024-08-28: qty 30, 20d supply, fill #3
  Filled 2024-09-23 – 2024-10-12 (×3): qty 30, 20d supply, fill #4
  Filled ????-??-??: fill #2

## 2024-04-14 MED ORDER — VITAMIN D (ERGOCALCIFEROL) 50000 UNITS PO CAPS
1.0000 | ORAL_CAPSULE | ORAL | 3 refills | Status: AC
  Filled 2024-04-14: qty 4, 28d supply, fill #0
  Filled 2024-04-28 – 2024-06-19 (×2): qty 4, 28d supply, fill #1
  Filled 2024-09-23: qty 4, 28d supply, fill #2

## 2024-04-14 MED ORDER — FLUCONAZOLE 200 MG PO TABS
200.0000 mg | ORAL_TABLET | Freq: Every day | ORAL | 1 refills | Status: DC
  Filled 2024-04-14: qty 7, 7d supply, fill #0
  Filled 2024-04-18 – 2024-04-28 (×3): qty 7, 7d supply, fill #1

## 2024-04-14 MED ORDER — WEGOVY 1 MG/0.5ML ~~LOC~~ SOAJ
1.0000 mg | SUBCUTANEOUS | 0 refills | Status: DC
  Filled 2024-04-14: qty 2, 28d supply, fill #0

## 2024-04-14 MED ORDER — ONDANSETRON 4 MG PO TBDP
4.0000 mg | ORAL_TABLET | Freq: Three times a day (TID) | ORAL | 1 refills | Status: DC
  Filled 2024-04-14 – 2024-04-28 (×4): qty 30, 10d supply, fill #0
  Filled 2024-05-06 – 2024-06-11 (×2): qty 30, 10d supply, fill #1

## 2024-04-14 MED ORDER — DEXLANSOPRAZOLE 60 MG PO CPDR
60.0000 mg | DELAYED_RELEASE_CAPSULE | Freq: Every day | ORAL | 5 refills | Status: AC
  Filled 2024-04-14 – 2024-05-11 (×2): qty 30, 30d supply, fill #0
  Filled 2024-07-31: qty 30, 30d supply, fill #1

## 2024-04-14 MED ORDER — GABAPENTIN 300 MG PO CAPS
300.0000 mg | ORAL_CAPSULE | Freq: Two times a day (BID) | ORAL | 1 refills | Status: AC
  Filled 2024-04-14 – 2024-04-18 (×2): qty 60, 30d supply, fill #0
  Filled 2024-04-18: qty 180, 90d supply, fill #0
  Filled 2024-04-28: qty 60, 30d supply, fill #0
  Filled 2024-05-06: qty 180, 90d supply, fill #0
  Filled 2024-05-11: qty 60, 30d supply, fill #0
  Filled 2024-10-17: qty 60, 30d supply, fill #1

## 2024-04-14 MED ORDER — ALPRAZOLAM 2 MG PO TABS
2.0000 mg | ORAL_TABLET | Freq: Every day | ORAL | 0 refills | Status: AC
  Filled 2024-04-14: qty 30, 30d supply, fill #0
  Filled 2024-04-18: qty 90, 90d supply, fill #0
  Filled 2024-04-28: qty 30, 30d supply, fill #0
  Filled 2024-09-23: qty 30, 30d supply, fill #1

## 2024-04-14 MED ORDER — NYSTATIN 100000 UNIT/ML MT SUSP
OROMUCOSAL | 3 refills | Status: AC
  Filled 2024-04-14: qty 210, 14d supply, fill #0
  Filled 2024-04-28: qty 210, 14d supply, fill #1
  Filled 2024-05-11: qty 210, 14d supply, fill #2
  Filled 2024-06-26 – 2024-08-28 (×2): qty 210, 14d supply, fill #3

## 2024-04-14 MED ORDER — VOQUEZNA 20 MG PO TABS
20.0000 mg | ORAL_TABLET | Freq: Every day | ORAL | 1 refills | Status: AC
  Filled 2024-04-14: qty 30, 30d supply, fill #0
  Filled 2024-05-06 – 2024-05-11 (×2): qty 30, 30d supply, fill #1
  Filled 2024-06-06 – 2024-06-11 (×2): qty 30, 30d supply, fill #2
  Filled 2024-06-19 – 2024-07-31 (×2): qty 30, 30d supply, fill #3
  Filled 2024-09-23 – 2024-10-07 (×2): qty 30, 30d supply, fill #4

## 2024-04-14 MED ORDER — KETOCONAZOLE 2 % EX CREA
1.0000 | TOPICAL_CREAM | Freq: Every day | CUTANEOUS | 3 refills | Status: AC
  Filled 2024-04-28: qty 60, 30d supply, fill #0
  Filled 2024-05-06 – 2024-05-21 (×2): qty 60, 30d supply, fill #1
  Filled 2024-06-06 – 2024-08-28 (×4): qty 60, 30d supply, fill #2
  Filled 2024-09-23 – 2024-10-12 (×3): qty 60, 30d supply, fill #3

## 2024-04-14 MED ORDER — ACYCLOVIR 800 MG PO TABS
800.0000 mg | ORAL_TABLET | Freq: Two times a day (BID) | ORAL | 3 refills | Status: AC
  Filled 2024-04-14: qty 60, 30d supply, fill #0

## 2024-04-14 MED ORDER — ACCRUFER 30 MG PO CAPS: 1.0000 | ORAL_CAPSULE | Freq: Two times a day (BID) | ORAL | 3 refills | Status: AC

## 2024-04-14 MED ORDER — METOPROLOL TARTRATE 50 MG PO TABS
50.0000 mg | ORAL_TABLET | Freq: Two times a day (BID) | ORAL | 5 refills | Status: AC
  Filled 2024-04-14: qty 60, 30d supply, fill #0

## 2024-04-15 ENCOUNTER — Other Ambulatory Visit (HOSPITAL_COMMUNITY): Payer: Self-pay

## 2024-04-15 ENCOUNTER — Other Ambulatory Visit: Payer: Self-pay

## 2024-04-16 ENCOUNTER — Ambulatory Visit: Admitting: Orthopaedic Surgery

## 2024-04-18 ENCOUNTER — Other Ambulatory Visit (HOSPITAL_COMMUNITY): Payer: Self-pay

## 2024-04-18 ENCOUNTER — Other Ambulatory Visit: Payer: Self-pay

## 2024-04-21 ENCOUNTER — Other Ambulatory Visit (HOSPITAL_COMMUNITY): Payer: Self-pay

## 2024-04-21 MED ORDER — NITROGLYCERIN 0.4 MG SL SUBL
SUBLINGUAL_TABLET | SUBLINGUAL | 1 refills | Status: AC
  Filled 2024-04-21: qty 25, 25d supply, fill #0
  Filled 2024-08-28: qty 25, 25d supply, fill #1

## 2024-04-25 ENCOUNTER — Other Ambulatory Visit: Payer: Self-pay

## 2024-04-25 ENCOUNTER — Other Ambulatory Visit (HOSPITAL_COMMUNITY): Payer: Self-pay

## 2024-04-25 MED ORDER — LIDOCAINE 5 % EX OINT
1.0000 | TOPICAL_OINTMENT | Freq: Three times a day (TID) | CUTANEOUS | 1 refills | Status: AC | PRN
  Filled 2024-04-25: qty 50, 17d supply, fill #0
  Filled 2024-08-28: qty 50, 17d supply, fill #1

## 2024-04-28 ENCOUNTER — Other Ambulatory Visit: Payer: Self-pay

## 2024-04-28 ENCOUNTER — Other Ambulatory Visit (HOSPITAL_COMMUNITY): Payer: Self-pay

## 2024-04-30 ENCOUNTER — Other Ambulatory Visit (HOSPITAL_COMMUNITY): Payer: Self-pay

## 2024-04-30 MED ORDER — ACYCLOVIR 800 MG PO TABS
800.0000 mg | ORAL_TABLET | Freq: Two times a day (BID) | ORAL | 3 refills | Status: AC
  Filled 2024-04-30 – 2024-05-21 (×4): qty 60, 30d supply, fill #0

## 2024-05-02 ENCOUNTER — Other Ambulatory Visit (HOSPITAL_COMMUNITY): Payer: Self-pay

## 2024-05-06 ENCOUNTER — Other Ambulatory Visit (HOSPITAL_COMMUNITY): Payer: Self-pay

## 2024-05-06 ENCOUNTER — Other Ambulatory Visit: Payer: Self-pay

## 2024-05-12 ENCOUNTER — Other Ambulatory Visit (HOSPITAL_COMMUNITY): Payer: Self-pay

## 2024-05-12 ENCOUNTER — Other Ambulatory Visit: Payer: Self-pay

## 2024-05-12 ENCOUNTER — Ambulatory Visit: Admitting: Orthopaedic Surgery

## 2024-05-15 ENCOUNTER — Other Ambulatory Visit (HOSPITAL_COMMUNITY): Payer: Self-pay

## 2024-05-16 ENCOUNTER — Other Ambulatory Visit (HOSPITAL_COMMUNITY): Payer: Self-pay

## 2024-05-16 ENCOUNTER — Ambulatory Visit (INDEPENDENT_AMBULATORY_CARE_PROVIDER_SITE_OTHER): Admitting: Physician Assistant

## 2024-05-16 ENCOUNTER — Encounter (HOSPITAL_BASED_OUTPATIENT_CLINIC_OR_DEPARTMENT_OTHER): Payer: Self-pay | Admitting: Physician Assistant

## 2024-05-16 ENCOUNTER — Telehealth: Payer: Self-pay | Admitting: Podiatry

## 2024-05-16 ENCOUNTER — Ambulatory Visit: Admitting: Podiatry

## 2024-05-16 ENCOUNTER — Ambulatory Visit (HOSPITAL_BASED_OUTPATIENT_CLINIC_OR_DEPARTMENT_OTHER)

## 2024-05-16 DIAGNOSIS — M79605 Pain in left leg: Secondary | ICD-10-CM | POA: Diagnosis not present

## 2024-05-16 DIAGNOSIS — M722 Plantar fascial fibromatosis: Secondary | ICD-10-CM

## 2024-05-16 DIAGNOSIS — M5442 Lumbago with sciatica, left side: Secondary | ICD-10-CM | POA: Diagnosis not present

## 2024-05-16 MED ORDER — DEXAMETHASONE SODIUM PHOSPHATE 120 MG/30ML IJ SOLN
4.0000 mg | Freq: Once | INTRAMUSCULAR | Status: AC
Start: 2024-05-16 — End: 2024-05-16
  Administered 2024-05-16: 4 mg via INTRA_ARTICULAR

## 2024-05-16 MED ORDER — TRIAMCINOLONE ACETONIDE 10 MG/ML IJ SUSP
2.5000 mg | Freq: Once | INTRAMUSCULAR | Status: AC
Start: 2024-05-16 — End: 2024-05-16
  Administered 2024-05-16: 2.5 mg via INTRA_ARTICULAR

## 2024-05-16 NOTE — Progress Notes (Signed)
 Office Visit Note   Patient: Christina [name]           Date of Birth: 1981/04/14           MRN: [id number] Visit Date: 05/16/2024              Requested by: Lenon Nell SAILOR, FNP [address] [place],  [place] [address] PCP: Lenon Nell SAILOR, FNP   Assessment & Plan: Visit Diagnoses:  1. 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  She comes in today with a chief complaint of left calf pain going from her Achilles up to the back of her knee.  Denies any particular injuries has been going on about a month.  Denies any shortness of breath fever or chills.  Also complaining of some low back pain.  X-rays today did not demonstrate any acute changes in her back.  Hard to say whether the calf pain is related to some tightness in her Achilles however because she had a equivocal Toula' sign and this has been going on a month we will get an ultrasound.  Will also call in some meloxicam  which she has taken in the past and tolerates.  If she does not do better with PT we will follow-up  Follow-Up Instructions: No follow-ups on file.   Orders:  Orders Placed This Encounter  Procedures   DG Lumbar Spine 2-3 Views   No orders of the defined types were placed in this encounter.     Procedures: No procedures performed   Clinical Data: No additional findings.   Subjective: Chief Complaint  Patient presents with   Lower Back - Pain    HPI Pleasant 43 year old woman comes in today with left-sided calf and lower leg pain.  No particular injury.  Has a history of plantar fasciitis for which she was injected today.  She feels like this might be different.  Also has had some lower back issues on the left Review of Systems  All other systems reviewed and are negative.    Objective: Vital Signs: There were no vitals taken for this visit.  Physical Exam Constitutional:       Appearance: Normal appearance.  Pulmonary:     Effort: Pulmonary effort is normal.  Skin:    General: Skin is warm and dry.  Neurological:     General: No focal deficit present.     Mental Status: She is alert and oriented to person, place, and time.  Psychiatric:        Mood and Affect: Mood normal.        Behavior: Behavior normal.     Ortho Exam Examination of the low back she has full flexion extension negative straight leg raise though she is slow to get up out of a chair.  Strength is intact with dorsiflexion plantarflexion of her ankles extension and flexion of her legs no problems with hip range of motion.  She does have some calf tenderness to palpation but no swelling or erythema.  She has an equivocal Toula' sign Specialty Comments:  No specialty comments available.  Imaging: No results found.   PMFS History: Patient Active Problem List   Diagnosis Date Noted   Migraine with aura 12/04/2012   Cephalgia 12/04/2012   Past Medical History:  Diagnosis Date   Abnormal Pap smear    BV (bacterial vaginosis)  GERD (gastroesophageal reflux disease)    Migraine     Family History  Problem Relation Age of Onset   Arrhythmia Mother    Hypertension Mother    Stroke Mother    Heart attack Mother    Cancer Father        brain and lung   Hypertension Father    Migraines Father    Colon cancer Neg Hx    Stomach cancer Neg Hx     Past Surgical History:  Procedure Laterality Date   APPENDECTOMY     CESAREAN SECTION     DENTAL SURGERY     LAPAROSCOPIC APPENDECTOMY N/A 02/25/2023   Procedure: APPENDECTOMY LAPAROSCOPIC;  Surgeon: Ebbie Cough, MD;  Location: [facility];  Service: General;  Laterality: N/A;   Social History   Occupational History   Not on file  Tobacco Use   Smoking status: Never   Smokeless tobacco: Never  Vaping Use   Vaping status: Never Used  Substance and Sexual Activity   Alcohol use: No   Drug use: No   Sexual activity: Not  on file

## 2024-05-16 NOTE — Telephone Encounter (Signed)
 Patient called at 12:43 on 05/16/24, forgot to get note for work, before leaving appointment, work note needed from employer stating, patient can return to work with no restrictions on September 3rd

## 2024-05-16 NOTE — Progress Notes (Signed)
  Subjective:  Patient ID: Christina [name], female    DOB: [birth year],   MRN: [id number]  Chief Complaint  Patient presents with   Plantar Fasciitis    Pt stated that her feet are not doing better she still has a lot of discomfort     44 y.o. female presents for follow-up of bilateral plantar fasciitis. Relates left is still very painful. Right is more tolerable. She is relating pains going up her leg on the left and has appointment with ortho later today. She has been stretching and wearing brace.  Denies any other pedal complaints. Denies n/v/f/c.   Past Medical History:  Diagnosis Date   Abnormal Pap smear    BV (bacterial vaginosis)    GERD (gastroesophageal reflux disease)    Migraine     Objective:  Physical Exam: Vascular: DP/PT pulses 2/4 bilateral. CFT <3 seconds. Normal hair growth on digits. No edema.  Skin. No lacerations or abrasions bilateral feet.  Musculoskeletal: MMT 5/5 bilateral lower extremities in DF, PF, Inversion and Eversion. Deceased ROM in DF of ankle joint. Bilateral mild HAV deformity noted. Tender to the medial calcaneal tubercle bilaterally . No pain with achilles, PT or arch. No pain with calcaneal squeeze.  Neurological: Sensation intact to light touch.   Assessment:   1. Plantar fasciitis, right   2. Plantar fasciitis, left       Plan:  Patient was evaluated and treated and all questions answered. Discussed plantar fasciitis with patient.  X-rays reviewed and discussed with patient. No acute fractures or dislocations noted. Mild spurring noted at inferior calcaneus. Bilateral mild HAV deformities noted.  Discussed treatment options including, ice, NSAIDS, supportive shoes, bracing, and stretching. Continue stretching anti-inflammatoires and brace  Injection offered today procedure below.  She is also going to get her back checked out for pains going down leg.  Follow-up 6 weeks or sooner if any problems arise. In the meantime, encouraged to  call the office with any questions, concerns, change in symptoms.   Procedure: Injection Tendon/Ligament Discussed alternatives, risks, complications and verbal consent was obtained.  Location: Left plantar fascia . Skin Prep: Alcohol. Injectate: 1cc 0.5% marcaine  plain, 1 cc dexamethasone  0.5 cc kenalog   Disposition: Patient tolerated procedure well. Injection site dressed with a band-aid.  Post-injection care was discussed and return precautions discussed.      Asberry Failing, DPM

## 2024-05-21 ENCOUNTER — Other Ambulatory Visit (HOSPITAL_COMMUNITY): Payer: Self-pay

## 2024-05-22 ENCOUNTER — Other Ambulatory Visit (HOSPITAL_COMMUNITY): Payer: Self-pay

## 2024-05-22 MED ORDER — CLINDAMYCIN PHOSPHATE 2 % VA GEL
12.0000 | VAGINAL | 2 refills | Status: AC
  Filled 2024-05-22: qty 12, 84d supply, fill #0
  Filled 2024-06-19: qty 32, 28d supply, fill #0
  Filled 2024-08-28: qty 32, 30d supply, fill #0
  Filled 2024-09-23: qty 32, 28d supply, fill #0

## 2024-05-23 ENCOUNTER — Other Ambulatory Visit (HOSPITAL_COMMUNITY): Payer: Self-pay

## 2024-05-26 ENCOUNTER — Other Ambulatory Visit (HOSPITAL_COMMUNITY): Payer: Self-pay

## 2024-05-29 ENCOUNTER — Other Ambulatory Visit (HOSPITAL_COMMUNITY): Payer: Self-pay

## 2024-05-30 ENCOUNTER — Other Ambulatory Visit: Payer: Self-pay

## 2024-05-30 ENCOUNTER — Other Ambulatory Visit (HOSPITAL_COMMUNITY): Payer: Self-pay

## 2024-06-02 ENCOUNTER — Other Ambulatory Visit: Payer: Self-pay

## 2024-06-02 ENCOUNTER — Other Ambulatory Visit (HOSPITAL_COMMUNITY): Payer: Self-pay

## 2024-06-04 ENCOUNTER — Ambulatory Visit: Attending: Physician Assistant | Admitting: Physical Therapy

## 2024-06-04 ENCOUNTER — Other Ambulatory Visit: Payer: Self-pay

## 2024-06-04 ENCOUNTER — Encounter (INDEPENDENT_AMBULATORY_CARE_PROVIDER_SITE_OTHER): Admitting: Vascular Surgery

## 2024-06-04 ENCOUNTER — Encounter: Payer: Self-pay | Admitting: Physical Therapy

## 2024-06-04 DIAGNOSIS — M542 Cervicalgia: Secondary | ICD-10-CM | POA: Diagnosis present

## 2024-06-04 DIAGNOSIS — M6281 Muscle weakness (generalized): Secondary | ICD-10-CM | POA: Diagnosis present

## 2024-06-04 DIAGNOSIS — M5442 Lumbago with sciatica, left side: Secondary | ICD-10-CM | POA: Insufficient documentation

## 2024-06-04 NOTE — Therapy (Addendum)
 OUTPATIENT PHYSICAL THERAPY THORACOLUMBAR EVALUATION AND DISCHARGE   Patient Name: Christina [name] MRN: [id number] DOB:[birth year], 43 y.o., female Today's Date: 06/04/2024  END OF SESSION:  PT End of Session - 06/04/24 1628     Visit Number 1    Number of Visits 16    Date for PT Re-Evaluation 07/30/24    Authorization Type BCBS    PT Start Time 0930    PT Stop Time 1013    PT Time Calculation (min) 43 min    Activity Tolerance Patient tolerated treatment well    Behavior During Therapy WFL for tasks assessed/performed          Past Medical History:  Diagnosis Date   Abnormal Pap smear    BV (bacterial vaginosis)    GERD (gastroesophageal reflux disease)    Migraine    Past Surgical History:  Procedure Laterality Date   APPENDECTOMY     CESAREAN SECTION     DENTAL SURGERY     LAPAROSCOPIC APPENDECTOMY N/A 02/25/2023   Procedure: APPENDECTOMY LAPAROSCOPIC;  Surgeon: Ebbie Cough, MD;  Location: [facility];  Service: General;  Laterality: N/A;   Patient Active Problem List   Diagnosis Date Noted   Migraine with aura 12/04/2012   Cephalgia 12/04/2012    PCP: Lenon Rouleau  REFERRING PROVIDER: Persons, Ronal Dragon  REFERRING DIAG: chronic Lt sided LBP with sciatica  Rationale for Evaluation and Treatment: Rehabilitation  THERAPY DIAG:  Cervicalgia  Left-sided low back pain with left-sided sciatica, unspecified chronicity  Muscle weakness (generalized)  ONSET DATE: 04/2024  SUBJECTIVE:                                                                                                                                                                                           SUBJECTIVE STATEMENT: Pt states that she was being treated for GERD and choking due to food and had an xray that noted a curvature in her spine. She states she works as a Engineer, civil (consulting) and wanted to make sure she addresses this issue before it causes more issues. She is also recently diagnosed with  plantar fasciitis.She states she does have low back pain but her upper back and traps hurt more. She does state she has some numbness in Lt LE when she wakes up. She states her UT pain is constant, Lt LE pain is worse in the morning. Hot shower and meds help decrease pain.  PERTINENT HISTORY:  Plantar fasciitis, GERD  PAIN:  Are you having pain? Yes: NPRS scale: Lt LE 5/10, upper traps 4/10 Pain location: Lt LE, upper traps Pain description: sore, numb Aggravating factors: UT is  constant pain, Lt LE is worse in the morning Relieving factors: heat, meds  PRECAUTIONS: None  RED FLAGS: None   WEIGHT BEARING RESTRICTIONS: No  FALLS:  Has patient fallen in last 6 months? Yes. Number of falls exact number unknown. 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  DIAGNOSTIC FINDINGS:  Lumbar x ray: 1. Left convex lumbar scoliosis, stable. 2. Lower lumbar facet hypertrophy. 3. No acute fracture.  PATIENT SURVEYS:  PSFS: THE PATIENT SPECIFIC FUNCTIONAL SCALE  Place score of 0-10 (0 = unable to perform activity and 10 = able to perform activity at the same level as before injury or problem)  Activity Date: 06/04/24    Not feeling sore all the time 3    2.     3.     4.      Total Score 3      Total Score = Sum of activity scores/number of activities  Minimally Detectable Change: 3 points (for single activity); 2 points (for average score)  [facility] (nd). The Patient Specific Functional Scale . Retrieved from [email or web address]   COGNITION: Overall cognitive status: Within functional limits for tasks assessed      MUSCLE  LENGTH: Hamstrings: Lt limited by pain Thomas test: positive Lt  POSTURE: weight shift right  PALPATION: Increased mm spasticity  Lt > Rt UT and cervical paraspinals TTP into bilat glutes, no TTP with lumbar CPAs or UPAs Increased mm spasticity bilat lumbar paraspinals  LUMBAR ROM:   AROM eval  Flexion 100%  Extension 75%  Right lateral flexion 75%  Left lateral flexion 100%  Right rotation 100%  Left rotation 100%   (Blank rows = not tested) CERVICAL ROM:   Active ROM A/PROM (deg) eval  Flexion 70%  Extension 70%  Right lateral flexion 75%  Left lateral flexion 75%  Right rotation 90%  Left rotation 70%   (Blank rows = not tested)  LOWER EXTREMITY ROM:    Rt WFL Lt limited by sciatic pain and HS tightness   LOWER EXTREMITY MMT:    MMT Right eval Left eval  Hip flexion 4+ 3  Hip extension 4- 4-  Hip abduction 4+ 4  Hip adduction    Hip internal rotation    Hip external rotation    Knee flexion    Knee extension    Ankle dorsiflexion    Ankle plantarflexion    Ankle inversion    Ankle eversion     (Blank rows = not tested) UPPER EXTREMITY MMT:  MMT Right eval Left eval  Shoulder flexion 4 sore 4 sore  Shoulder extension    Shoulder abduction 4 sore 4 sore  Shoulder adduction    Shoulder extension    Shoulder internal rotation    Shoulder external rotation    Middle trapezius    Lower trapezius    Elbow flexion    Elbow extension    Wrist flexion    Wrist extension    Wrist ulnar deviation    Wrist radial deviation    Wrist pronation    Wrist supination    Grip strength     (Blank rows = not tested)  LUMBAR SPECIAL TESTS:  Straight  leg raise test: Positive Left    TREATMENT DATE: 06/04/24 See HEP Pt educated on PT POC and goals, rationale for treatment                                                                                                                                 PATIENT EDUCATION:  Education details: PT POC and  goals, HEP Person educated: Patient Education method: Explanation, Demonstration, and Handouts Education comprehension: verbalized understanding and returned demonstration  HOME EXERCISE PROGRAM: Access Code: [email or web address] URL: [email or web address] Date: 06/04/2024 Prepared by: Darice Conine  Exercises - Seated Cervical Retraction  - 3 x daily - 7 x weekly - 1 sets - 10 reps - 3 seconds hold - Shoulder External Rotation and Scapular Retraction with Resistance  - 1 x daily - 7 x weekly - 3 sets - 10 reps - Seated Sciatic Tensioner  - 1 x daily - 7 x weekly - 2 sets - 10 reps - Squat with Chair Touch  - 1 x daily - 7 x weekly - 3 sets - 10 reps  ASSESSMENT:  CLINICAL IMPRESSION: Patient is a 43 y.o. female who was seen today for physical therapy evaluation and treatment for Lt sided LBP with sciatica. Pt presents with increased mm spasticity in cervical and lumbar paraspinals, increased pain, impaired functional mobility, impaired strength and ROM. She will benefit from skilled PT to address deficits and improve functional mobility with decreased pain.   OBJECTIVE IMPAIRMENTS: decreased activity tolerance, decreased ROM, decreased strength, increased muscle spasms, and pain.   ACTIVITY LIMITATIONS: caring for others  PARTICIPATION LIMITATIONS: occupation  PERSONAL FACTORS: Profession are also affecting patient's functional outcome.   REHAB POTENTIAL: Good  CLINICAL DECISION MAKING: Evolving/moderate complexity  EVALUATION COMPLEXITY: Moderate   GOALS: Goals reviewed with patient? Yes  SHORT TERM GOALS: Target date: 07/02/2024    Pt will be independent with initial HEP Baseline: Goal status: INITIAL    LONG TERM GOALS: Target date: 07/30/2024    Pt will be independent in advanced HEP to maintain core and postural strength to reduce chance of injury at work Baseline:  Goal status: INITIAL  2.  Pt will improve PSFS to >= 6 to demo improved functional  mobility Baseline:  Goal status: INITIAL  3.  Pt will improve LE strength to 4+/5 bilat to improve standing and walking tolerance with decreased symptoms Baseline:  Goal status: INITIAL   PLAN:  PT FREQUENCY: 2x/week  PT DURATION: 8 weeks  PLANNED INTERVENTIONS: 97164- PT Re-evaluation, 97110-Therapeutic exercises, 97530- Therapeutic activity, 97112- Neuromuscular re-education, 97535- Self Care, [id number]- Manual therapy, [id number]- Aquatic Therapy, H9716- Electrical stimulation (unattended), 20560 (1-2 muscles), 20561 (3+ muscles)- Dry Needling, Patient/Family education, Taping, Cryotherapy, and Moist heat.  PLAN FOR NEXT SESSION: assess response to HEP, postural and core strength and mobility   Kynnedi Zweig, PT 06/04/2024, 4:29 PM  PHYSICAL THERAPY DISCHARGE SUMMARY  Visits from Start of Care: Eval only  Current functional  level related to goals / functional outcomes: Eval only - see above   Remaining deficits: See above   Education / Equipment: HEP   Patient agrees to discharge. Patient goals were not met. Patient is being discharged due to not returning since the last visit. Darice Conine, PT,DPT09/30/259:37 AM

## 2024-06-06 ENCOUNTER — Other Ambulatory Visit (HOSPITAL_COMMUNITY): Payer: Self-pay

## 2024-06-06 ENCOUNTER — Other Ambulatory Visit: Payer: Self-pay

## 2024-06-09 ENCOUNTER — Other Ambulatory Visit (HOSPITAL_COMMUNITY): Payer: Self-pay

## 2024-06-09 ENCOUNTER — Encounter (HOSPITAL_COMMUNITY): Payer: Self-pay

## 2024-06-09 MED ORDER — FLUCONAZOLE 200 MG PO TABS
200.0000 mg | ORAL_TABLET | Freq: Every day | ORAL | 1 refills | Status: AC
  Filled 2024-06-09: qty 7, 7d supply, fill #0
  Filled 2024-06-19: qty 7, 7d supply, fill #1

## 2024-06-09 MED ORDER — WEGOVY 1 MG/0.5ML ~~LOC~~ SOAJ
1.0000 mg | SUBCUTANEOUS | 0 refills | Status: AC
  Filled 2024-06-09: qty 2, 28d supply, fill #0

## 2024-06-11 ENCOUNTER — Other Ambulatory Visit (HOSPITAL_COMMUNITY): Payer: Self-pay

## 2024-06-11 ENCOUNTER — Ambulatory Visit

## 2024-06-12 ENCOUNTER — Other Ambulatory Visit (HOSPITAL_COMMUNITY): Payer: Self-pay

## 2024-06-13 ENCOUNTER — Other Ambulatory Visit: Payer: Self-pay | Admitting: Physician Assistant

## 2024-06-13 ENCOUNTER — Other Ambulatory Visit (HOSPITAL_COMMUNITY): Payer: Self-pay

## 2024-06-13 ENCOUNTER — Telehealth: Payer: Self-pay

## 2024-06-13 DIAGNOSIS — M79605 Pain in left leg: Secondary | ICD-10-CM

## 2024-06-13 NOTE — Telephone Encounter (Signed)
 [facility] is asking if the DVT study need Prior Auth--[phone]

## 2024-06-16 ENCOUNTER — Encounter

## 2024-06-17 ENCOUNTER — Ambulatory Visit (HOSPITAL_COMMUNITY)

## 2024-06-18 ENCOUNTER — Ambulatory Visit (HOSPITAL_COMMUNITY)
Admission: RE | Admit: 2024-06-18 | Discharge: 2024-06-18 | Disposition: A | Discharge status: Home / Self Care | Source: Ambulatory Visit | Attending: Physician Assistant | Admitting: Physician Assistant

## 2024-06-18 ENCOUNTER — Ambulatory Visit: Admitting: Physical Therapy

## 2024-06-18 DIAGNOSIS — M79605 Pain in left leg: Secondary | ICD-10-CM | POA: Insufficient documentation

## 2024-06-19 ENCOUNTER — Other Ambulatory Visit (HOSPITAL_COMMUNITY): Payer: Self-pay

## 2024-06-20 ENCOUNTER — Other Ambulatory Visit: Payer: Self-pay

## 2024-06-20 ENCOUNTER — Other Ambulatory Visit (HOSPITAL_COMMUNITY): Payer: Self-pay

## 2024-06-20 MED ORDER — CLOBETASOL PROPIONATE 0.05 % EX CREA
1.0000 | TOPICAL_CREAM | Freq: Two times a day (BID) | CUTANEOUS | 2 refills | Status: AC
  Filled 2024-06-20: qty 15, 15d supply, fill #0
  Filled 2024-07-17: qty 15, 15d supply, fill #1
  Filled 2024-08-28: qty 15, 15d supply, fill #2

## 2024-06-21 ENCOUNTER — Other Ambulatory Visit (HOSPITAL_COMMUNITY): Payer: Self-pay

## 2024-06-24 ENCOUNTER — Other Ambulatory Visit (HOSPITAL_COMMUNITY): Payer: Self-pay

## 2024-06-24 MED ORDER — WEGOVY 1.7 MG/0.75ML ~~LOC~~ SOAJ
1.7000 mg | SUBCUTANEOUS | 0 refills | Status: DC
  Filled 2024-06-24: qty 3, 28d supply, fill #0

## 2024-06-25 ENCOUNTER — Other Ambulatory Visit (HOSPITAL_COMMUNITY): Payer: Self-pay

## 2024-06-25 ENCOUNTER — Ambulatory Visit: Admitting: Physical Therapy

## 2024-06-26 ENCOUNTER — Other Ambulatory Visit: Payer: Self-pay

## 2024-06-26 ENCOUNTER — Ambulatory Visit

## 2024-06-26 ENCOUNTER — Other Ambulatory Visit (HOSPITAL_COMMUNITY): Payer: Self-pay

## 2024-06-26 ENCOUNTER — Ambulatory Visit (INDEPENDENT_AMBULATORY_CARE_PROVIDER_SITE_OTHER): Admitting: Podiatry

## 2024-06-26 DIAGNOSIS — Z91199 Patient's noncompliance with other medical treatment and regimen due to unspecified reason: Secondary | ICD-10-CM

## 2024-06-26 MED ORDER — AMITRIPTYLINE HCL 75 MG PO TABS
75.0000 mg | ORAL_TABLET | Freq: Every day | ORAL | 1 refills | Status: AC
  Filled 2024-06-26 – 2024-07-17 (×4): qty 30, 30d supply, fill #0
  Filled 2024-08-28: qty 30, 30d supply, fill #1
  Filled 2024-10-07: qty 30, 30d supply, fill #2

## 2024-06-26 MED ORDER — VITAMIN D (ERGOCALCIFEROL) 50000 UNITS PO CAPS
ORAL_CAPSULE | ORAL | 3 refills | Status: AC
  Filled 2024-06-26 – 2024-08-28 (×4): qty 4, 28d supply, fill #0
  Filled 2024-09-23 – 2024-10-07 (×2): qty 4, 28d supply, fill #1

## 2024-06-26 MED ORDER — GABAPENTIN 300 MG PO CAPS
300.0000 mg | ORAL_CAPSULE | Freq: Two times a day (BID) | ORAL | 1 refills | Status: AC
  Filled 2024-06-26 – 2024-07-31 (×2): qty 60, 30d supply, fill #0
  Filled 2024-08-28: qty 60, 30d supply, fill #1
  Filled 2024-09-23 – 2024-10-12 (×3): qty 60, 30d supply, fill #2

## 2024-06-26 MED ORDER — ALPRAZOLAM 2 MG PO TABS
2.0000 mg | ORAL_TABLET | Freq: Every day | ORAL | 0 refills | Status: AC
  Filled 2024-06-26: qty 90, 90d supply, fill #0
  Filled 2024-06-26: qty 30, 30d supply, fill #0
  Filled 2024-06-27: qty 23, 23d supply, fill #0
  Filled 2024-06-27: qty 7, 7d supply, fill #0
  Filled 2024-07-09: qty 23, 23d supply, fill #1
  Filled 2024-08-04: qty 23, 23d supply, fill #2
  Filled 2024-08-28: qty 23, 23d supply, fill #3
  Filled 2024-09-23 – 2024-09-26 (×2): qty 14, 14d supply, fill #4

## 2024-06-26 MED ORDER — ALPRAZOLAM 1 MG PO TABS
1.0000 mg | ORAL_TABLET | Freq: Every morning | ORAL | 0 refills | Status: AC
  Filled 2024-06-26 – 2024-08-04 (×3): qty 30, 30d supply, fill #0
  Filled 2024-08-28: qty 30, 30d supply, fill #1
  Filled 2024-09-23 – 2024-10-07 (×2): qty 30, 30d supply, fill #2

## 2024-06-26 MED ORDER — ACCRUFER 30 MG PO CAPS
ORAL_CAPSULE | ORAL | 3 refills | Status: AC
  Filled 2024-06-26: qty 60, 30d supply, fill #0

## 2024-06-26 MED ORDER — ACYCLOVIR 800 MG PO TABS
800.0000 mg | ORAL_TABLET | Freq: Two times a day (BID) | ORAL | 3 refills | Status: AC
  Filled 2024-06-26: qty 60, 30d supply, fill #0
  Filled 2024-07-31: qty 60, 30d supply, fill #1
  Filled 2024-08-28: qty 60, 30d supply, fill #2
  Filled 2024-09-23 – 2024-10-12 (×3): qty 60, 30d supply, fill #3

## 2024-06-26 MED ORDER — DEXLANSOPRAZOLE 60 MG PO CPDR
60.0000 mg | DELAYED_RELEASE_CAPSULE | Freq: Every day | ORAL | 5 refills | Status: AC
  Filled 2024-06-26 – 2024-07-17 (×3): qty 30, 30d supply, fill #0
  Filled 2024-08-28: qty 30, 30d supply, fill #1
  Filled 2024-09-23 – 2024-10-07 (×2): qty 30, 30d supply, fill #2

## 2024-06-26 NOTE — Progress Notes (Signed)
 No show

## 2024-06-27 ENCOUNTER — Other Ambulatory Visit (HOSPITAL_COMMUNITY): Payer: Self-pay

## 2024-06-27 ENCOUNTER — Telehealth: Payer: Self-pay | Admitting: Physician Assistant

## 2024-06-27 ENCOUNTER — Encounter (HOSPITAL_COMMUNITY): Payer: Self-pay

## 2024-06-27 ENCOUNTER — Encounter: Admitting: Physical Therapy

## 2024-06-27 NOTE — Telephone Encounter (Signed)
 Pt called stating that your needs to know if she can return to work without restrictions. Pt call back number is [phone]

## 2024-06-30 ENCOUNTER — Other Ambulatory Visit: Payer: Self-pay

## 2024-07-01 ENCOUNTER — Other Ambulatory Visit (HOSPITAL_COMMUNITY): Payer: Self-pay

## 2024-07-02 ENCOUNTER — Other Ambulatory Visit (HOSPITAL_COMMUNITY): Payer: Self-pay

## 2024-07-03 ENCOUNTER — Other Ambulatory Visit (HOSPITAL_COMMUNITY): Payer: Self-pay

## 2024-07-06 ENCOUNTER — Other Ambulatory Visit (HOSPITAL_COMMUNITY): Payer: Self-pay

## 2024-07-07 ENCOUNTER — Other Ambulatory Visit (HOSPITAL_COMMUNITY): Payer: Self-pay

## 2024-07-09 ENCOUNTER — Other Ambulatory Visit (HOSPITAL_COMMUNITY): Payer: Self-pay

## 2024-07-17 ENCOUNTER — Other Ambulatory Visit (HOSPITAL_COMMUNITY): Payer: Self-pay

## 2024-07-17 ENCOUNTER — Other Ambulatory Visit: Payer: Self-pay

## 2024-07-21 ENCOUNTER — Encounter: Payer: Self-pay | Admitting: Radiology

## 2024-07-31 ENCOUNTER — Other Ambulatory Visit: Payer: Self-pay

## 2024-07-31 ENCOUNTER — Other Ambulatory Visit (HOSPITAL_COMMUNITY): Payer: Self-pay

## 2024-07-31 MED ORDER — ONDANSETRON 4 MG PO TBDP
4.0000 mg | ORAL_TABLET | Freq: Three times a day (TID) | ORAL | 1 refills | Status: DC
  Filled 2024-07-31: qty 30, 10d supply, fill #0
  Filled 2024-08-28: qty 30, 10d supply, fill #1

## 2024-08-01 ENCOUNTER — Other Ambulatory Visit (HOSPITAL_COMMUNITY): Payer: Self-pay

## 2024-08-01 ENCOUNTER — Other Ambulatory Visit: Payer: Self-pay

## 2024-08-04 ENCOUNTER — Other Ambulatory Visit: Payer: Self-pay

## 2024-08-04 ENCOUNTER — Other Ambulatory Visit (HOSPITAL_COMMUNITY): Payer: Self-pay

## 2024-08-06 ENCOUNTER — Other Ambulatory Visit (HOSPITAL_COMMUNITY): Payer: Self-pay

## 2024-08-07 ENCOUNTER — Other Ambulatory Visit (HOSPITAL_COMMUNITY): Payer: Self-pay

## 2024-08-08 ENCOUNTER — Other Ambulatory Visit (HOSPITAL_COMMUNITY): Payer: Self-pay

## 2024-08-27 ENCOUNTER — Other Ambulatory Visit (HOSPITAL_COMMUNITY): Payer: Self-pay

## 2024-08-28 ENCOUNTER — Other Ambulatory Visit (HOSPITAL_COMMUNITY): Payer: Self-pay

## 2024-08-28 MED ORDER — WEGOVY 1.7 MG/0.75ML ~~LOC~~ SOAJ
1.7000 mg | SUBCUTANEOUS | 0 refills | Status: DC
  Filled 2024-08-28: qty 3, 28d supply, fill #0

## 2024-08-29 ENCOUNTER — Other Ambulatory Visit (HOSPITAL_COMMUNITY): Payer: Self-pay

## 2024-08-29 MED ORDER — CLINDAMYCIN PHOSPHATE 2 % VA CREA
TOPICAL_CREAM | VAGINAL | 1 refills | Status: AC
  Filled 2024-08-29: qty 40, 7d supply, fill #0
  Filled 2024-09-23 – 2024-10-12 (×3): qty 40, 7d supply, fill #1

## 2024-09-02 ENCOUNTER — Other Ambulatory Visit (HOSPITAL_COMMUNITY): Payer: Self-pay

## 2024-09-05 ENCOUNTER — Other Ambulatory Visit (HOSPITAL_COMMUNITY): Payer: Self-pay

## 2024-09-05 ENCOUNTER — Other Ambulatory Visit: Payer: Self-pay

## 2024-09-16 ENCOUNTER — Other Ambulatory Visit (HOSPITAL_COMMUNITY): Payer: Self-pay

## 2024-09-16 MED ORDER — ESCITALOPRAM OXALATE 10 MG PO TABS
10.0000 mg | ORAL_TABLET | Freq: Every day | ORAL | 3 refills | Status: AC
  Filled 2024-09-16: qty 30, 30d supply, fill #0
  Filled 2024-10-07 – 2024-10-09 (×2): qty 30, 30d supply, fill #1

## 2024-09-16 MED ORDER — WEGOVY 2.4 MG/0.75ML ~~LOC~~ SOAJ
2.4000 mg | SUBCUTANEOUS | 3 refills | Status: AC
  Filled 2024-09-16: qty 3, 28d supply, fill #0
  Filled 2024-10-07 – 2024-10-17 (×2): qty 3, 28d supply, fill #1

## 2024-09-23 ENCOUNTER — Other Ambulatory Visit: Payer: Self-pay

## 2024-09-23 ENCOUNTER — Other Ambulatory Visit (HOSPITAL_COMMUNITY): Payer: Self-pay

## 2024-09-24 ENCOUNTER — Other Ambulatory Visit (HOSPITAL_COMMUNITY): Payer: Self-pay

## 2024-09-26 ENCOUNTER — Other Ambulatory Visit (HOSPITAL_COMMUNITY): Payer: Self-pay

## 2024-09-26 ENCOUNTER — Other Ambulatory Visit: Payer: Self-pay

## 2024-09-27 ENCOUNTER — Other Ambulatory Visit (HOSPITAL_COMMUNITY): Payer: Self-pay

## 2024-10-02 ENCOUNTER — Other Ambulatory Visit (HOSPITAL_COMMUNITY): Payer: Self-pay

## 2024-10-07 ENCOUNTER — Other Ambulatory Visit: Payer: Self-pay

## 2024-10-07 ENCOUNTER — Other Ambulatory Visit (HOSPITAL_COMMUNITY): Payer: Self-pay

## 2024-10-09 ENCOUNTER — Other Ambulatory Visit (HOSPITAL_COMMUNITY): Payer: Self-pay

## 2024-10-11 ENCOUNTER — Other Ambulatory Visit (HOSPITAL_COMMUNITY): Payer: Self-pay

## 2024-10-12 ENCOUNTER — Other Ambulatory Visit (HOSPITAL_COMMUNITY): Payer: Self-pay

## 2024-10-17 ENCOUNTER — Other Ambulatory Visit: Payer: Self-pay

## 2024-10-17 ENCOUNTER — Other Ambulatory Visit (HOSPITAL_COMMUNITY): Payer: Self-pay

## 2024-10-17 MED ORDER — WEGOVY 1.7 MG/0.75ML ~~LOC~~ SOAJ
1.7000 mg | SUBCUTANEOUS | 0 refills | Status: AC
  Filled 2024-10-17 (×2): qty 3, 28d supply, fill #0

## 2024-10-17 MED ORDER — ONDANSETRON 4 MG PO TBDP
4.0000 mg | ORAL_TABLET | Freq: Three times a day (TID) | ORAL | 1 refills | Status: AC
  Filled 2024-10-17: qty 30, 10d supply, fill #0

## 2024-10-18 ENCOUNTER — Other Ambulatory Visit (HOSPITAL_COMMUNITY): Payer: Self-pay

## 2024-10-21 ENCOUNTER — Other Ambulatory Visit (HOSPITAL_COMMUNITY): Payer: Self-pay

## 2024-10-21 MED ORDER — WEGOVY 2.4 MG/0.75ML ~~LOC~~ SOAJ
2.4000 mg | SUBCUTANEOUS | 3 refills | Status: AC
  Filled 2024-10-21: qty 3, 28d supply, fill #0

## 2024-10-21 MED ORDER — CLOBETASOL PROPIONATE 0.05 % EX CREA
TOPICAL_CREAM | CUTANEOUS | 2 refills | Status: AC
  Filled 2024-10-21: qty 15, 20d supply, fill #0

## 2024-10-23 ENCOUNTER — Other Ambulatory Visit (HOSPITAL_COMMUNITY): Payer: Self-pay
